# Patient Record
Sex: Female | Born: 1970 | Race: White | Hispanic: No | Marital: Married | Smoking: Never smoker
Health system: Southern US, Community
[De-identification: ages and names within clinical notes are randomized; demographics above are authoritative.]

## PROBLEM LIST (undated history)

## (undated) DIAGNOSIS — T7840XA Allergy, unspecified, initial encounter: Secondary | ICD-10-CM

## (undated) DIAGNOSIS — B20 Human immunodeficiency virus [HIV] disease: Secondary | ICD-10-CM

## (undated) DIAGNOSIS — B019 Varicella without complication: Secondary | ICD-10-CM

## (undated) DIAGNOSIS — N390 Urinary tract infection, site not specified: Secondary | ICD-10-CM

## (undated) DIAGNOSIS — Z98811 Dental restoration status: Secondary | ICD-10-CM

## (undated) DIAGNOSIS — Z21 Asymptomatic human immunodeficiency virus [HIV] infection status: Secondary | ICD-10-CM

## (undated) DIAGNOSIS — Z923 Personal history of irradiation: Secondary | ICD-10-CM

## (undated) DIAGNOSIS — C50919 Malignant neoplasm of unspecified site of unspecified female breast: Secondary | ICD-10-CM

## (undated) HISTORY — DX: Allergy, unspecified, initial encounter: T78.40XA

## (undated) HISTORY — DX: Varicella without complication: B01.9

## (undated) HISTORY — DX: Asymptomatic human immunodeficiency virus (hiv) infection status: Z21

## (undated) HISTORY — PX: BREAST SURGERY: SHX581

## (undated) HISTORY — DX: Human immunodeficiency virus (HIV) disease: B20

## (undated) HISTORY — DX: Urinary tract infection, site not specified: N39.0

## (undated) HISTORY — PX: DILATION AND EVACUATION: SHX1459

---

## 1997-09-11 ENCOUNTER — Other Ambulatory Visit: Admission: RE | Admit: 1997-09-11 | Discharge: 1997-09-11 | Payer: Self-pay | Admitting: Obstetrics and Gynecology

## 1999-01-06 ENCOUNTER — Other Ambulatory Visit: Admission: RE | Admit: 1999-01-06 | Discharge: 1999-01-06 | Payer: Self-pay | Admitting: Obstetrics and Gynecology

## 2003-12-17 ENCOUNTER — Other Ambulatory Visit: Admission: RE | Admit: 2003-12-17 | Discharge: 2003-12-17 | Payer: Self-pay | Admitting: Family Medicine

## 2005-01-30 ENCOUNTER — Other Ambulatory Visit: Admission: RE | Admit: 2005-01-30 | Discharge: 2005-01-30 | Payer: Self-pay | Admitting: Physician Assistant

## 2006-03-02 ENCOUNTER — Other Ambulatory Visit: Admission: RE | Admit: 2006-03-02 | Discharge: 2006-03-02 | Payer: Self-pay | Admitting: Family Medicine

## 2007-04-11 ENCOUNTER — Other Ambulatory Visit: Admission: RE | Admit: 2007-04-11 | Discharge: 2007-04-11 | Payer: Self-pay | Admitting: Family Medicine

## 2008-04-08 ENCOUNTER — Encounter (INDEPENDENT_AMBULATORY_CARE_PROVIDER_SITE_OTHER): Payer: Self-pay | Admitting: Obstetrics & Gynecology

## 2008-04-08 ENCOUNTER — Ambulatory Visit (HOSPITAL_COMMUNITY): Admission: AD | Admit: 2008-04-08 | Discharge: 2008-04-08 | Payer: Self-pay | Admitting: Obstetrics & Gynecology

## 2008-08-01 ENCOUNTER — Encounter (INDEPENDENT_AMBULATORY_CARE_PROVIDER_SITE_OTHER): Payer: Self-pay | Admitting: Obstetrics & Gynecology

## 2008-08-01 ENCOUNTER — Ambulatory Visit (HOSPITAL_COMMUNITY): Admission: RE | Admit: 2008-08-01 | Discharge: 2008-08-01 | Payer: Self-pay | Admitting: Obstetrics & Gynecology

## 2009-12-13 ENCOUNTER — Other Ambulatory Visit: Admission: RE | Admit: 2009-12-13 | Discharge: 2009-12-13 | Payer: Self-pay | Admitting: Family Medicine

## 2010-08-28 LAB — RH IMMUNE GLOBULIN WORKUP (NOT WOMEN'S HOSP)
ABO/RH(D): A NEG
Antibody Screen: NEGATIVE

## 2010-08-28 LAB — CBC
HCT: 41.5 % (ref 36.0–46.0)
Hemoglobin: 14.2 g/dL (ref 12.0–15.0)
MCV: 94.2 fL (ref 78.0–100.0)
Platelets: 212 10*3/uL (ref 150–400)
RDW: 12.3 % (ref 11.5–15.5)

## 2010-08-28 LAB — ABO/RH: ABO/RH(D): A NEG

## 2010-09-30 NOTE — Op Note (Signed)
[name], [name]               ACCOUNT NO.:  [id number]   MEDICAL RECORD NO.:  [id number]          PATIENT TYPE:  AMB   LOCATION:  [facility]                           FACILITY:  [facility]   PHYSICIAN:  Ilda Mori, M.D.   DATE OF BIRTH:  June 25, 1970   DATE OF PROCEDURE:  04/08/2008  DATE OF DISCHARGE:                               OPERATIVE REPORT   PREOPERATIVE DIAGNOSIS:  Incomplete abortion.   POSTOPERATIVE DIAGNOSIS:  Incomplete abortion.   PROCEDURE:  Dilatation and evacuation.   SURGEON:  Ilda Mori, MD   ANESTHESIA:  Paracervical block with IV sedation.   ESTIMATED BLOOD LOSS:  Minimal.   FINDINGS:  Open internal cervical os, moderate amount of products of  conception.   INDICATIONS:  This is a 40 year old gravida 1 female at approximately 41  weeks' gestation, who presents to [facility] having an episode of persistent  heavy vaginal bleeding for the previous 4 hours.  The patient had  established intrauterine pregnancy at 6-7 weeks' gestation.   PROCEDURE:  The patient was taken to the operating room on an emergency  basis and IV sedation was administered.  The vagina and perineum were  prepped and draped in sterile fashion.  A speculum was placed.  The  cervix was infiltrated with 10 mL of 2% lidocaine.  A #8 suction curette  was introduced into the endometrial cavity without any dilation being  necessary and products of conception were evacuated.  The procedure was  then terminated.  The patient left the operating room in good condition.      Ilda Mori, M.D.  Electronically Signed     RK/MEDQ  D:  04/08/2008  T:  04/08/2008  Job:  [id number]

## 2010-09-30 NOTE — Op Note (Signed)
NAMEELDORA, [name]               ACCOUNT NO.:  [id number]   MEDICAL RECORD NO.:  [id number]          PATIENT TYPE:  AMB   LOCATION:  [facility]                           FACILITY:  [facility]   PHYSICIAN:  Ilda Mori, M.D.   DATE OF BIRTH:  Aug 24, 1970   DATE OF PROCEDURE:  08/01/2008  DATE OF DISCHARGE:                               OPERATIVE REPORT   PREOPERATIVE DIAGNOSIS:  Missed abortion.   POSTOPERATIVE DIAGNOSIS:  Missed abortion.   PROCEDURE:  Dilatation and evacuation.   SURGEON:  Ilda Mori, MD   ANESTHESIA:  Paracervical block with IV sedation.   FINDINGS:  Products of conception consistent with 7-8-week missed  abortion.   SPECIMEN:  Sent to Pathology.   ESTIMATED BLOOD LOSS:  Minimal.   COMPLICATIONS:  None.   INDICATIONS:  This is a 40 year old gravida 2, para 0-0-2-0 who  presented to the office on July 31, 2008, for a routine examination  where an ultrasound was performed and showed a fetal demise.  The  patient was 8 weeks' gestation by dates.  The ultrasound showed a 6-week  fetal pole with no heartbeat seen.  The findings were discussed with the  patient and decision was made to proceed with dilatation and evacuation.  Prior the procedure, the patient had another ultrasound at her request  at [facility] which confirmed the diagnosis of missed abortion.   PROCEDURE:  The patient was taken to the operating room and placed in  the supine position and then placed in the dorsal lithotomy position.  IV sedation was administered.  The perineum and vagina were prepped and  draped in a sterile fashion.  The edges of the cervix was grasped with a  single-tooth tenaculum and 10 mL of 2% lidocaine was infiltrated in the  paracervical tissues.  The internal os was dilated with Pratt dilators  to a 21-French.  A #7 suction curette was then introduced into the  endometrial cavity and the products of conception were evacuated.  The  procedure at this point was  terminated.  The patient left the operating  room in good condition.      Ilda Mori, M.D.  Electronically Signed     RK/MEDQ  D:  08/01/2008  T:  08/02/2008  Job:  [id number]

## 2011-02-18 LAB — CBC
Hemoglobin: 12
MCHC: 35.6
Platelets: 223
RDW: 12.6

## 2011-02-18 LAB — RHOGAM INJECTION

## 2011-03-27 ENCOUNTER — Other Ambulatory Visit: Payer: Self-pay | Admitting: Surgery

## 2011-12-30 ENCOUNTER — Other Ambulatory Visit (HOSPITAL_COMMUNITY): Payer: Self-pay | Admitting: Family Medicine

## 2011-12-30 DIAGNOSIS — Z1231 Encounter for screening mammogram for malignant neoplasm of breast: Secondary | ICD-10-CM

## 2012-01-15 ENCOUNTER — Ambulatory Visit (HOSPITAL_COMMUNITY)
Admission: RE | Admit: 2012-01-15 | Discharge: 2012-01-15 | Disposition: A | Discharge status: Home / Self Care | Payer: BC Managed Care – PPO | Source: Ambulatory Visit | Attending: Family Medicine | Admitting: Family Medicine

## 2012-01-15 DIAGNOSIS — Z1231 Encounter for screening mammogram for malignant neoplasm of breast: Secondary | ICD-10-CM | POA: Insufficient documentation

## 2012-01-15 IMAGING — MG MM DIGITAL SCREENING BILAT
5 series · 5 of 5 positions shown · non-contrast
Comparison: None.

CLINICAL DATA: Screening.

DIGITAL BILATERAL SCREENING MAMMOGRAM WITH CAD

[R CC (1 of 2)]
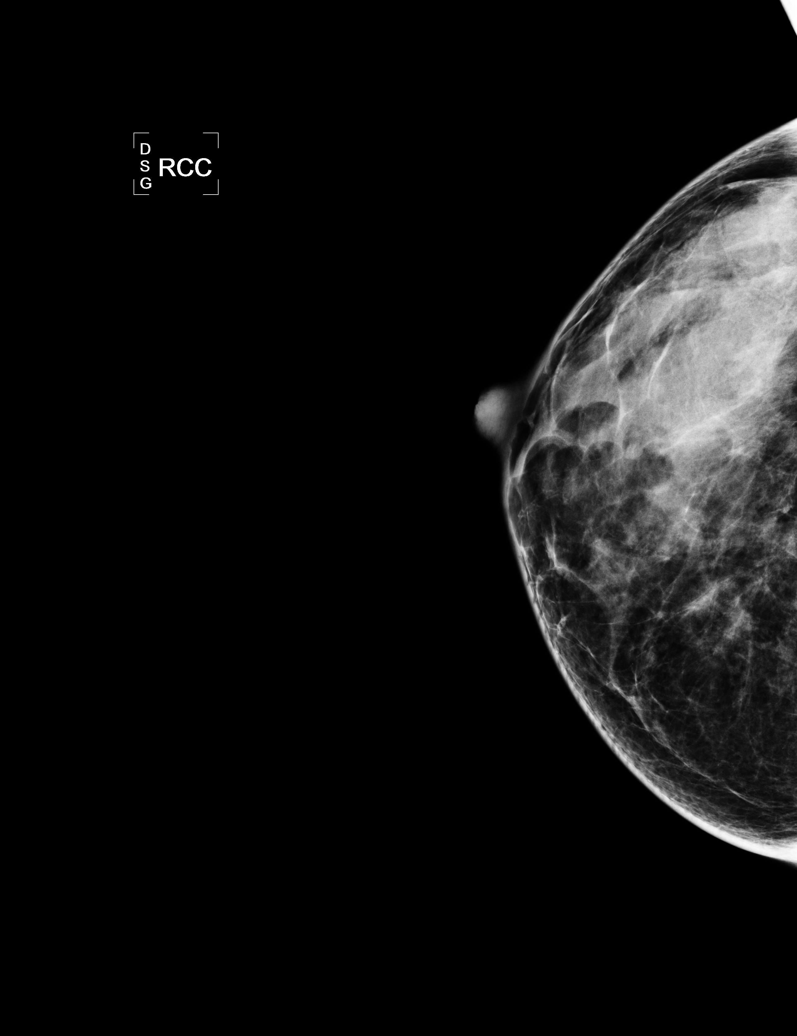

[R MLO]
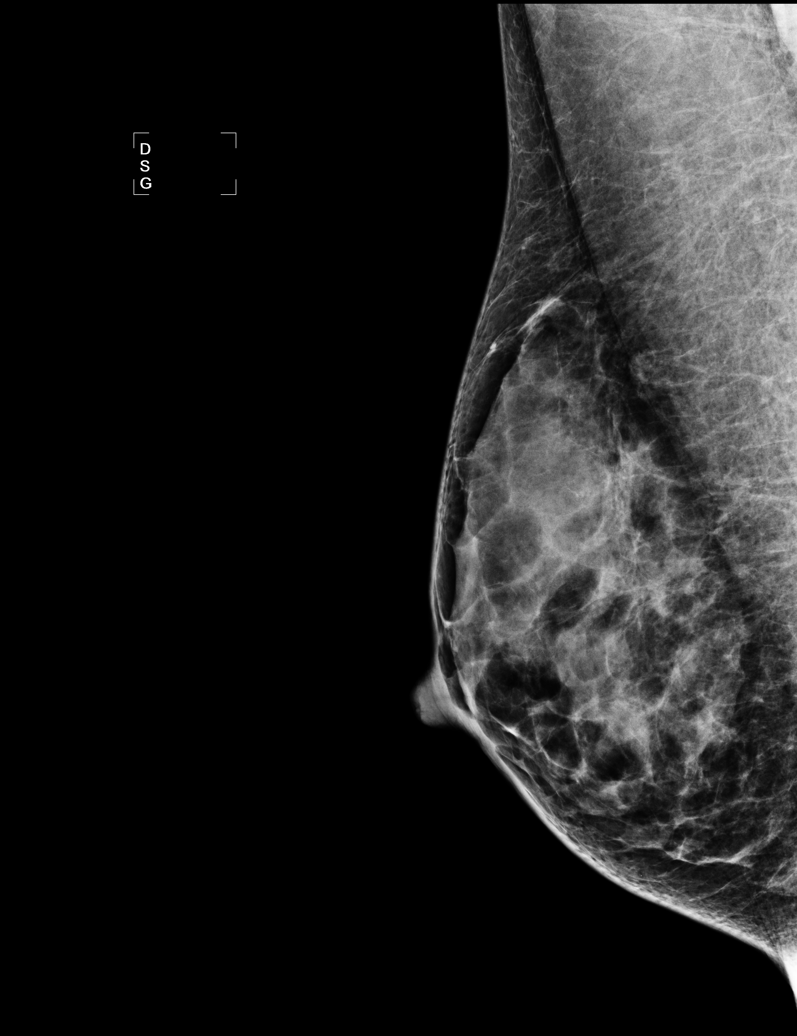

[L CC]
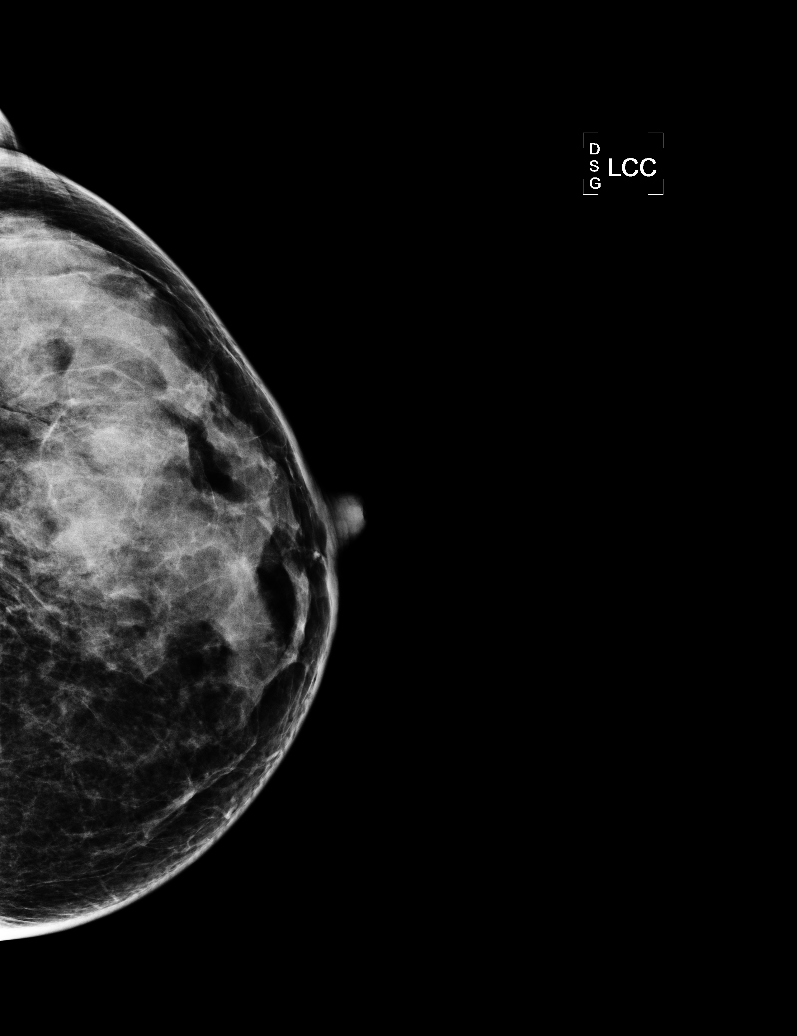

[L MLO]
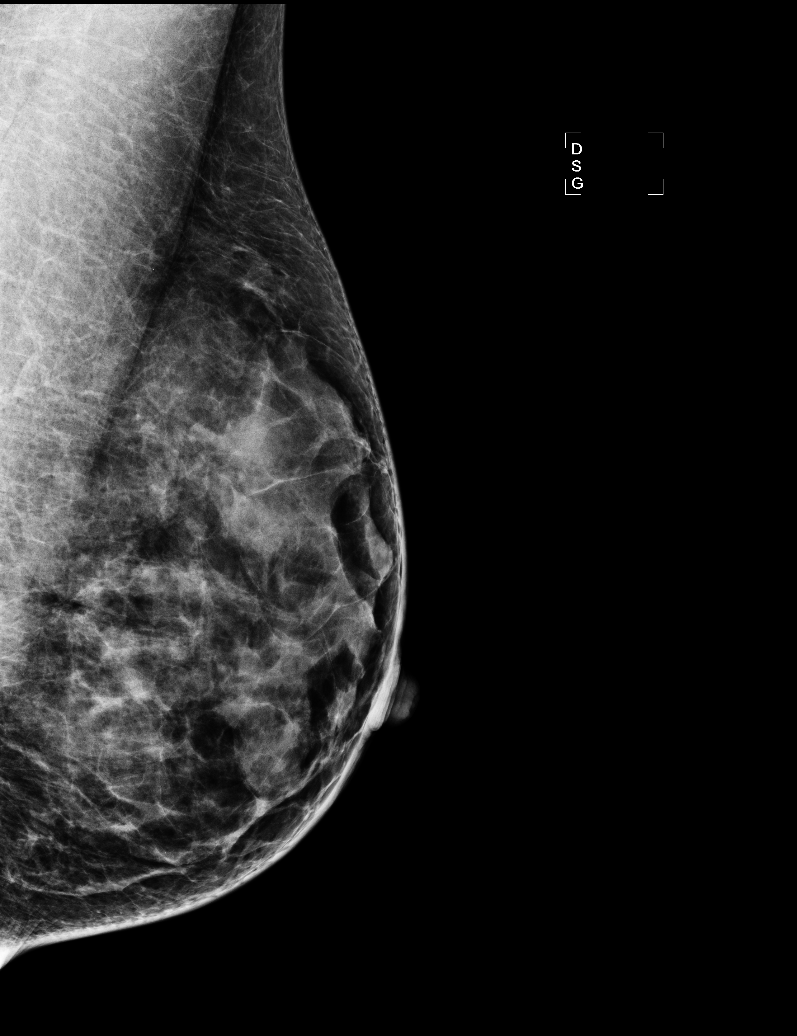

[R CC (2 of 2)]
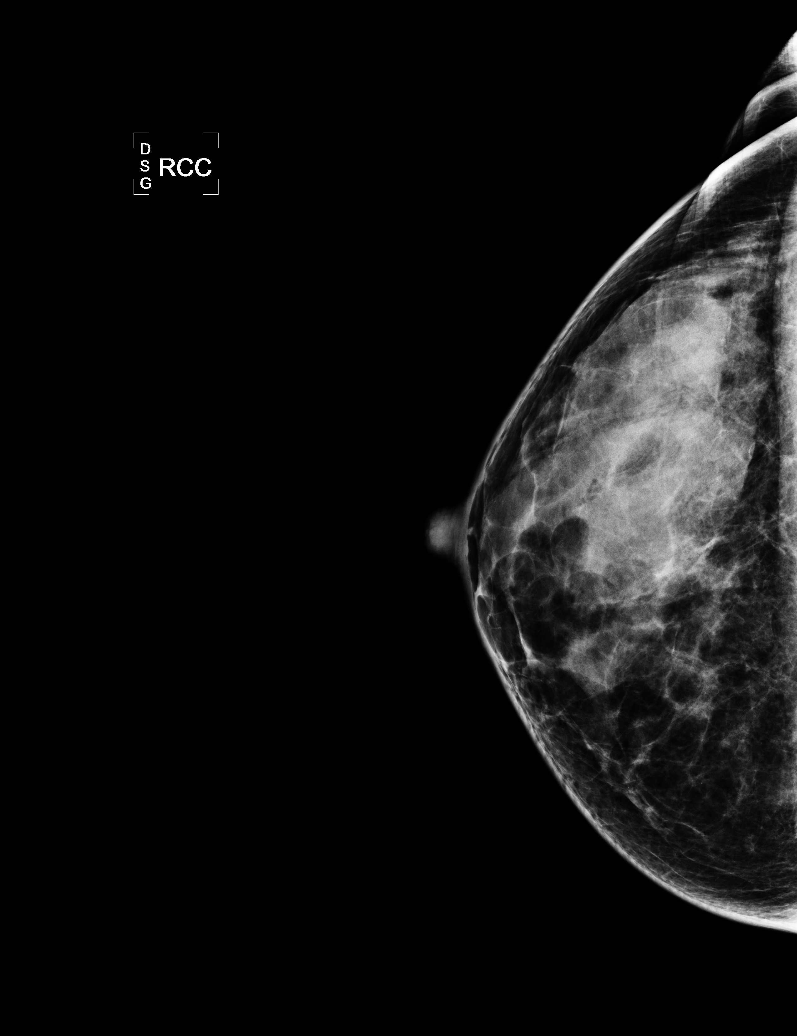

[5 of 5 positions shown; findings below may reference images not displayed]

FINDINGS: The breast tissue is heterogeneously dense. No suspicious
masses, architectural distortion, or calcifications are present.

Images were processed with CAD.
IMPRESSION: No mammographic evidence of malignancy.

A result letter of this screening mammogram will be mailed directly
to the patient.

RECOMMENATION:
Screening mammogram in one year. (Code:[H9])

BI-RADS CATEGORY 1:  Negative.

## 2012-09-09 ENCOUNTER — Other Ambulatory Visit (HOSPITAL_COMMUNITY)
Admission: RE | Admit: 2012-09-09 | Discharge: 2012-09-09 | Disposition: A | Discharge status: Home / Self Care | Payer: BC Managed Care – PPO | Source: Ambulatory Visit | Attending: Family Medicine | Admitting: Family Medicine

## 2012-09-09 ENCOUNTER — Other Ambulatory Visit: Payer: Self-pay | Admitting: Family Medicine

## 2012-09-09 DIAGNOSIS — Z1151 Encounter for screening for human papillomavirus (HPV): Secondary | ICD-10-CM | POA: Insufficient documentation

## 2012-09-09 DIAGNOSIS — Z01419 Encounter for gynecological examination (general) (routine) without abnormal findings: Secondary | ICD-10-CM | POA: Insufficient documentation

## 2012-09-30 ENCOUNTER — Other Ambulatory Visit: Payer: Self-pay | Admitting: Family Medicine

## 2012-09-30 DIAGNOSIS — R899 Unspecified abnormal finding in specimens from other organs, systems and tissues: Secondary | ICD-10-CM

## 2012-10-05 ENCOUNTER — Ambulatory Visit
Admission: RE | Admit: 2012-10-05 | Discharge: 2012-10-05 | Disposition: A | Discharge status: Home / Self Care | Payer: BC Managed Care – PPO | Source: Ambulatory Visit | Attending: Family Medicine | Admitting: Family Medicine

## 2012-10-05 DIAGNOSIS — R899 Unspecified abnormal finding in specimens from other organs, systems and tissues: Secondary | ICD-10-CM

## 2012-10-05 IMAGING — US US RENAL
1 series · 14 of 25 positions shown · non-contrast
Comparison: None.

CLINICAL DATA: Elevated renal laboratory values

RENAL/URINARY TRACT ULTRASOUND COMPLETE

[Series 1: us renal · 0.24mm/px · 14 of 34 slices shown]
[im 1/34]
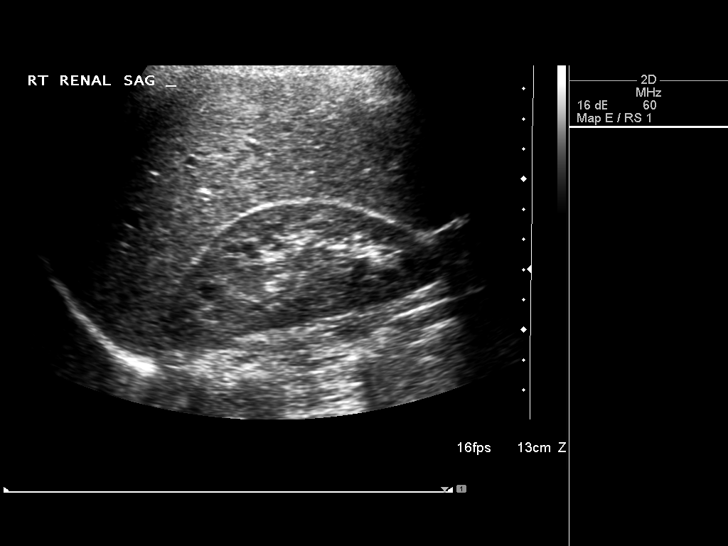
[im 3/34]
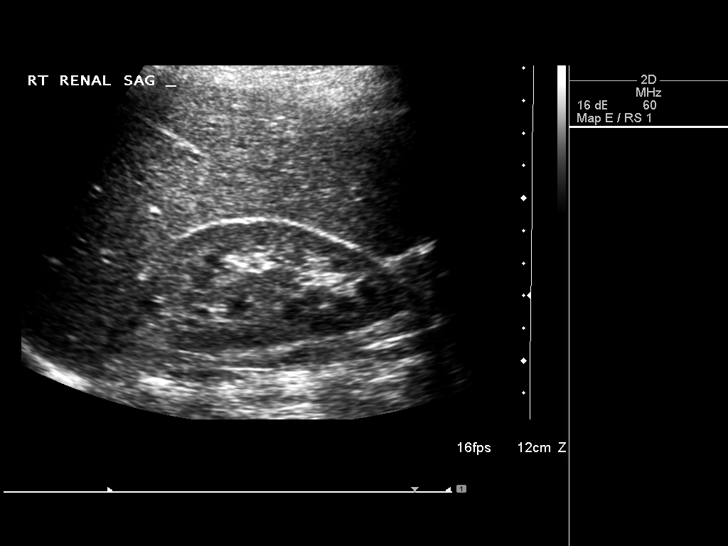
[im 6/34]
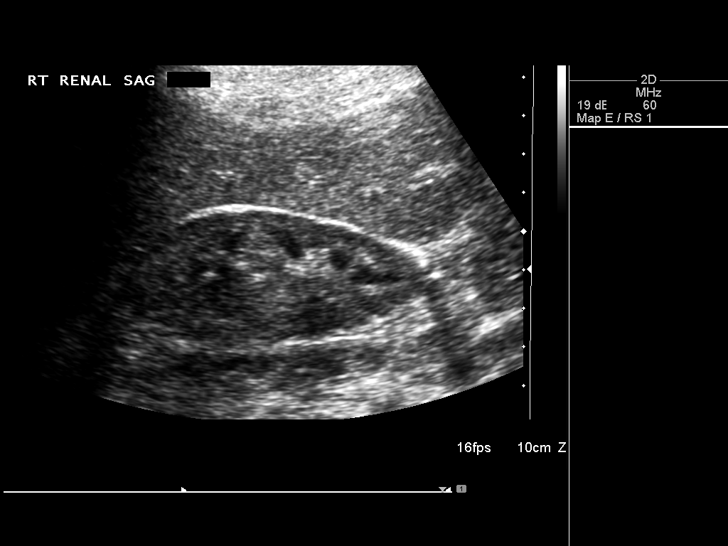
[im 9/34]
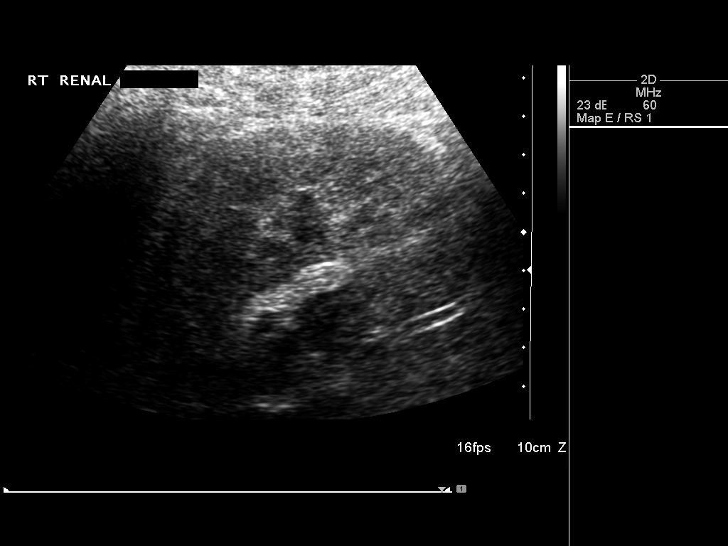
[im 12/34]
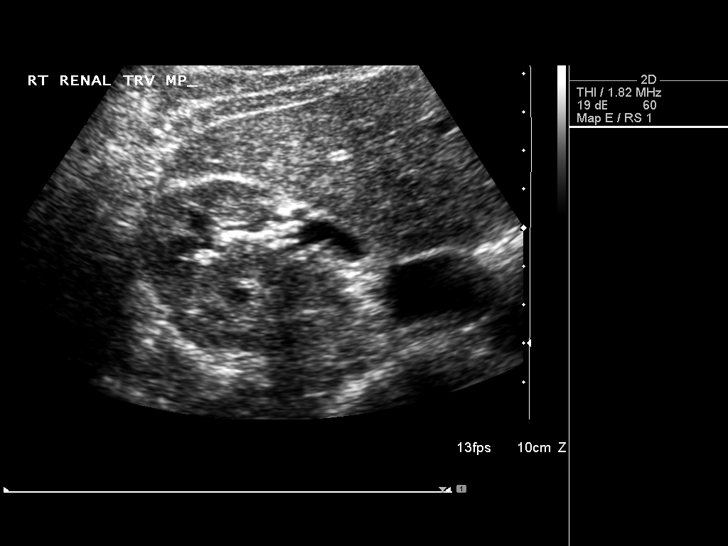
[im 13/34]
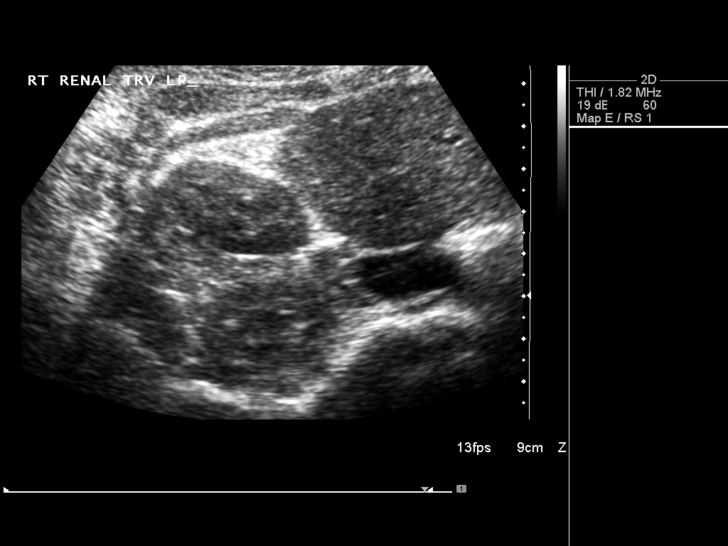
[im 16/34]
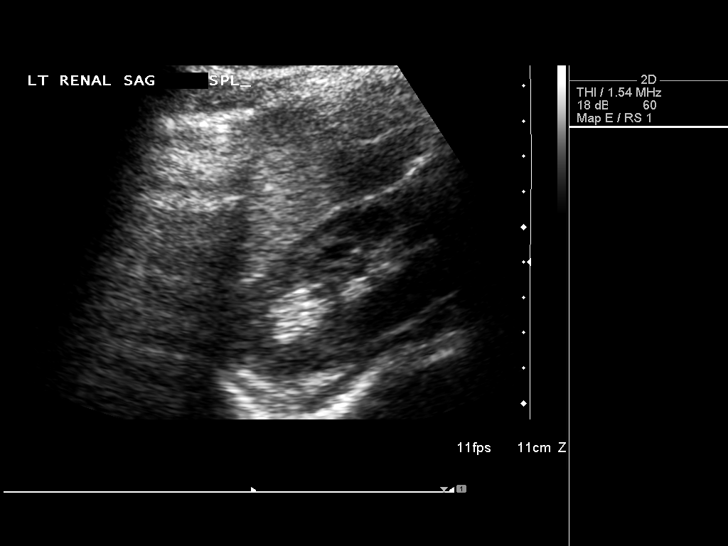
[im 18/34]
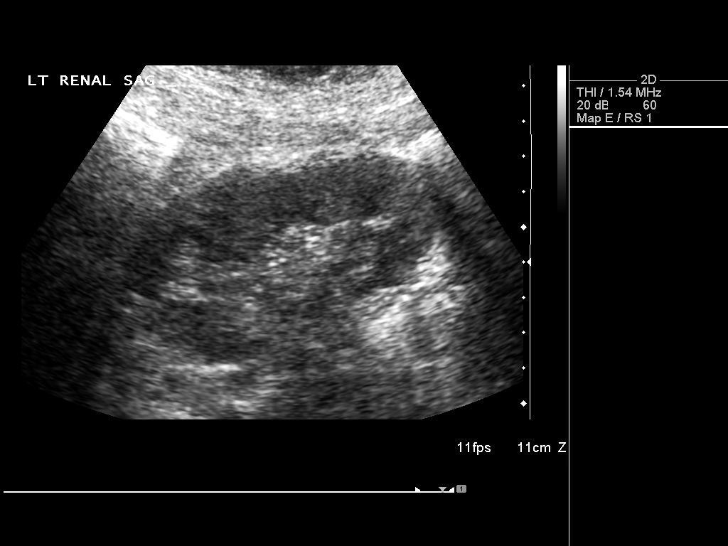
[im 21/34]
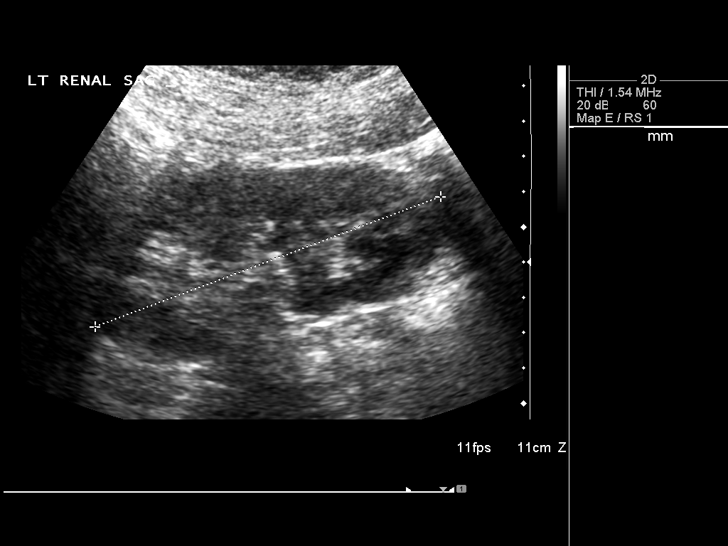
[im 23/34]
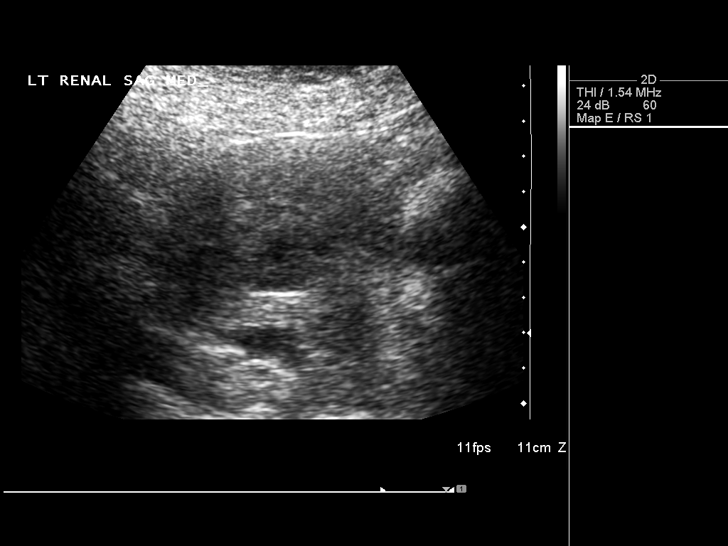
[im 25/34]
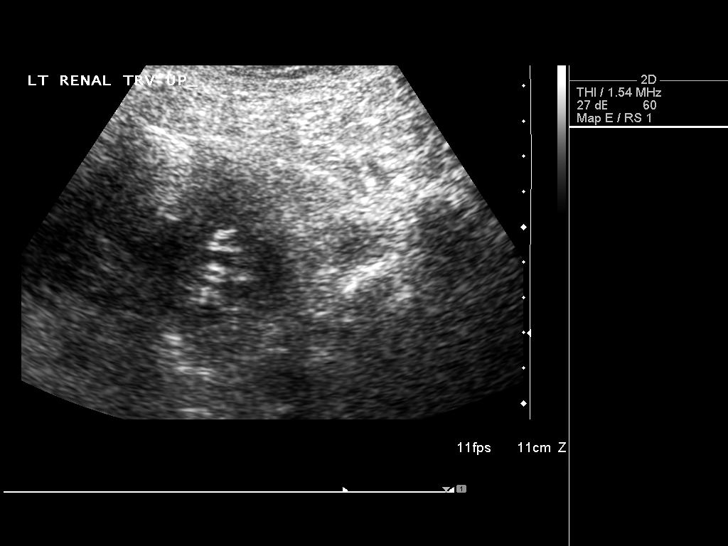
[im 28/34]
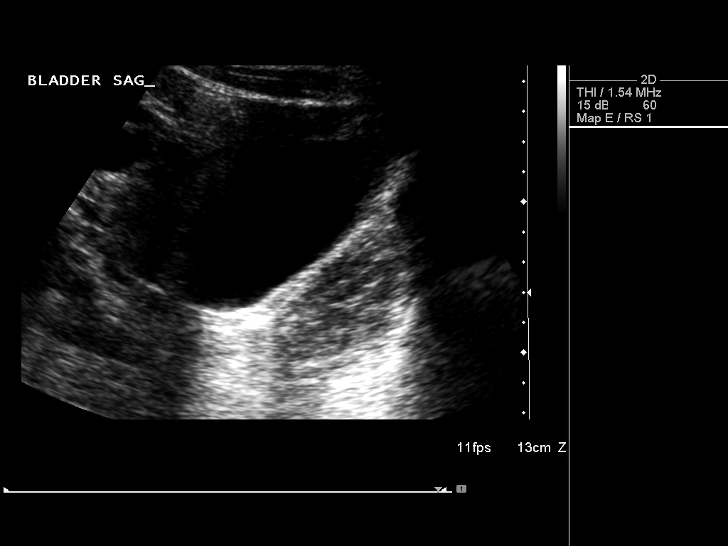
[im 31/34]
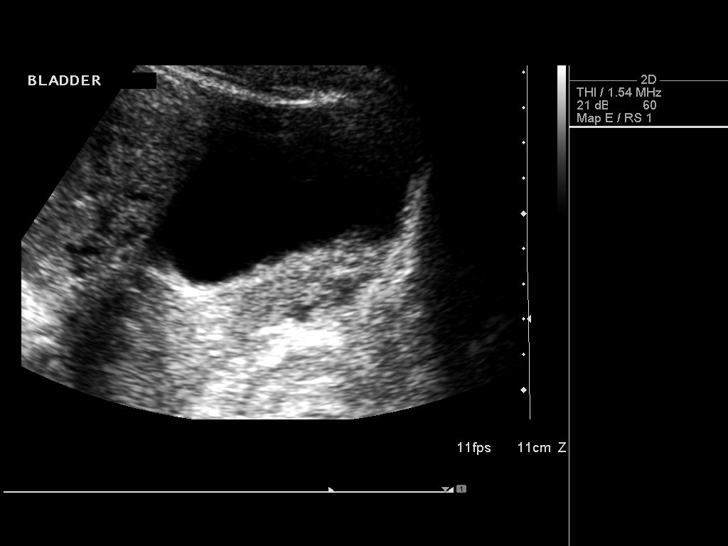
[im 34/34]
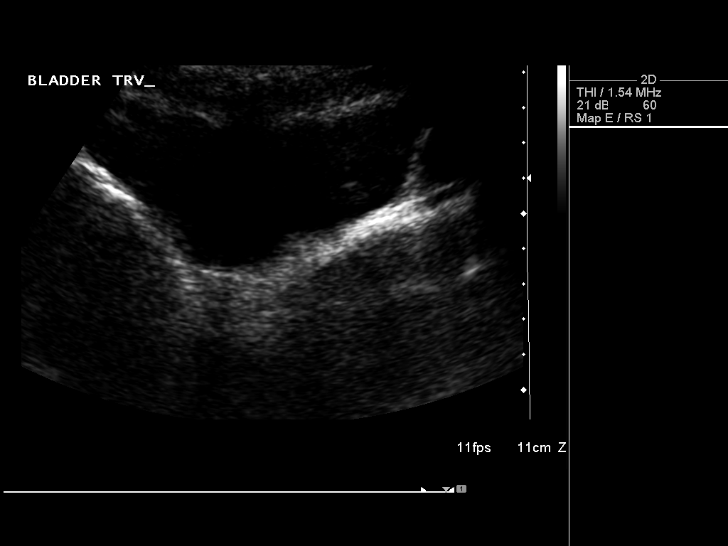

[14 of 25 positions shown; findings below may reference images not displayed]

FINDINGS: Right Kidney:  No hydronephrosis is seen.  The right kidney
measures 10.9 cm sagittally.

Left Kidney:  No hydronephrosis is noted.  The left kidney measures
10.5 cm.

Bladder:  The urinary bladder is unremarkable.
IMPRESSION: No hydronephrosis.  No abnormality of the urinary bladder.

## 2013-04-24 ENCOUNTER — Other Ambulatory Visit (HOSPITAL_COMMUNITY): Payer: Self-pay | Admitting: Family Medicine

## 2013-04-24 DIAGNOSIS — Z1231 Encounter for screening mammogram for malignant neoplasm of breast: Secondary | ICD-10-CM

## 2013-05-16 ENCOUNTER — Ambulatory Visit (HOSPITAL_COMMUNITY)
Admission: RE | Admit: 2013-05-16 | Discharge: 2013-05-16 | Disposition: A | Discharge status: Home / Self Care | Payer: BC Managed Care – PPO | Source: Ambulatory Visit | Attending: Family Medicine | Admitting: Family Medicine

## 2013-05-16 DIAGNOSIS — Z1231 Encounter for screening mammogram for malignant neoplasm of breast: Secondary | ICD-10-CM

## 2013-05-16 IMAGING — MG MM DIGITAL BREAST 3D TOMOSYNTHESIS
8 series · 9 of 24 positions shown · non-contrast
Comparison: Previous exam(s).

CLINICAL DATA: Screening.

EXAM:
DIGITAL SCREENING BILATERAL MAMMOGRAM WITH 3D TOMO WITH CAD

[L MLO]
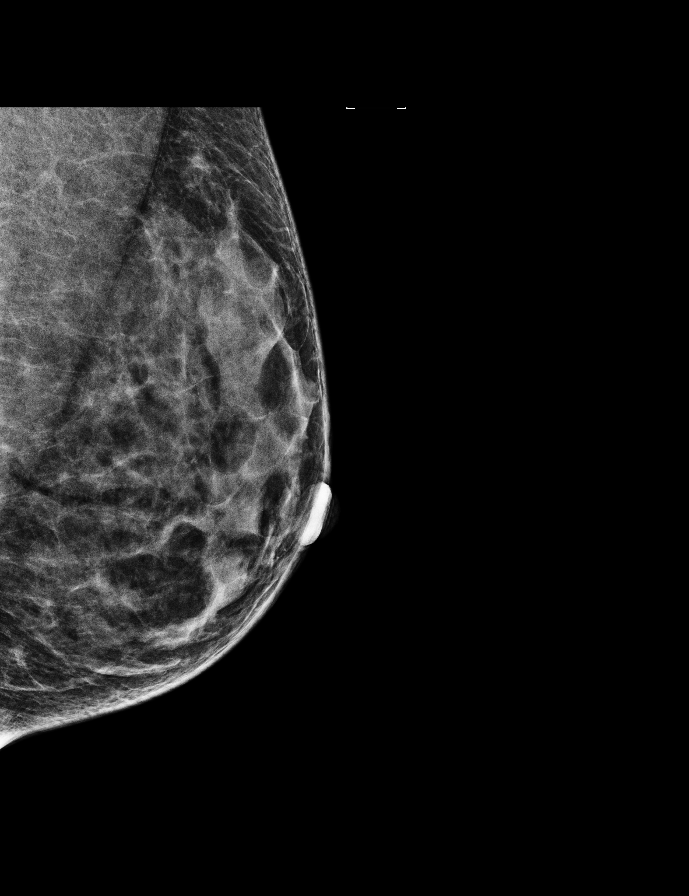

[R CC]
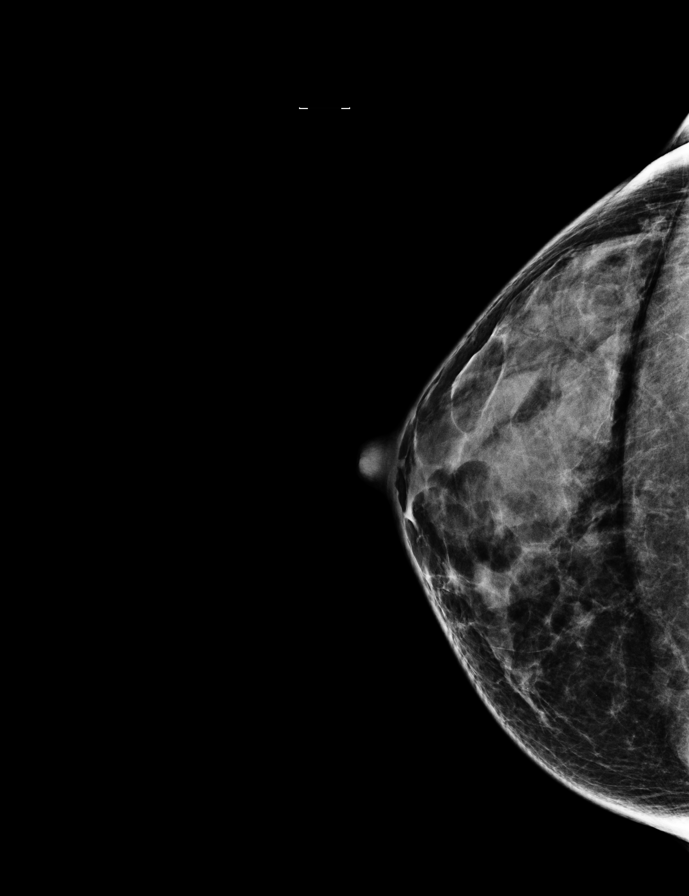

[R MLO]
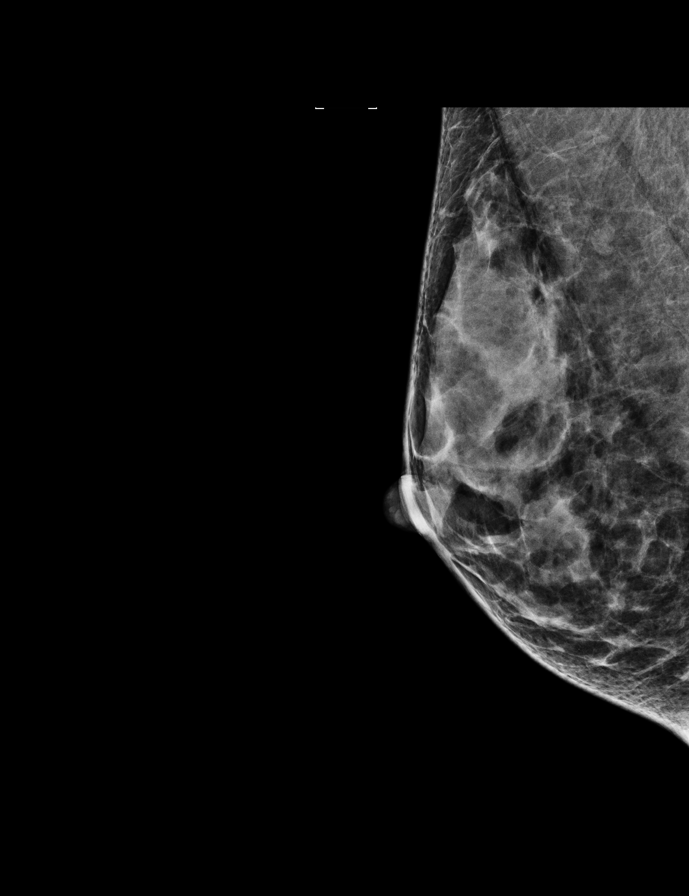

[L CC]
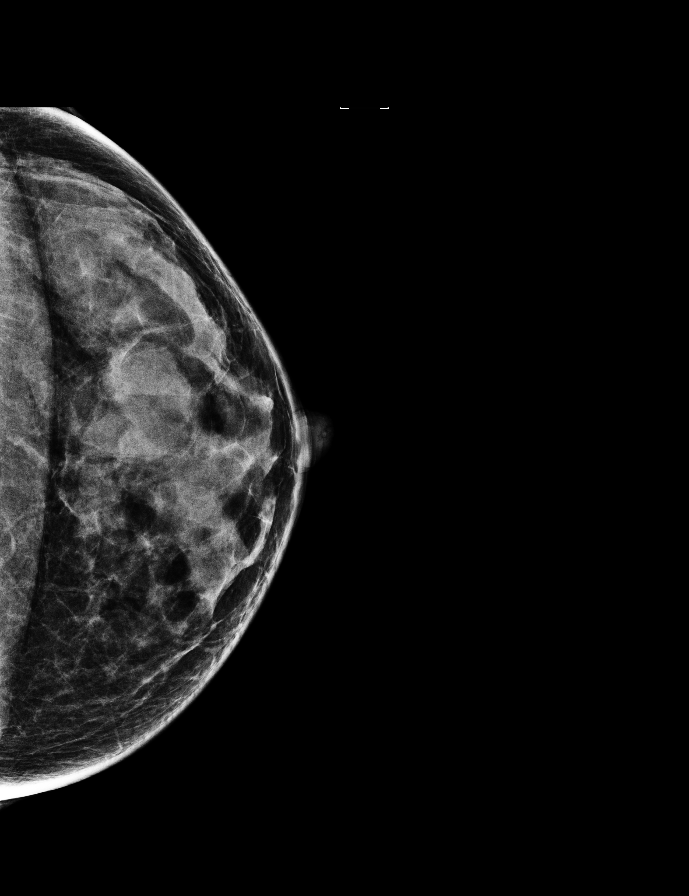

[R CC tomo · 2 of 52 frames shown]
[frame 17/52]
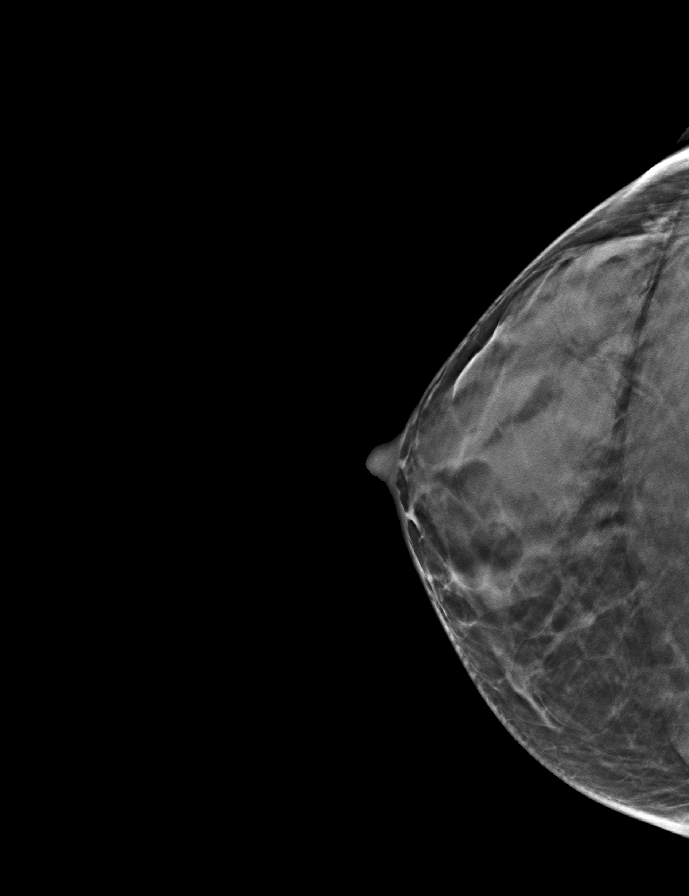
[frame 27/52]
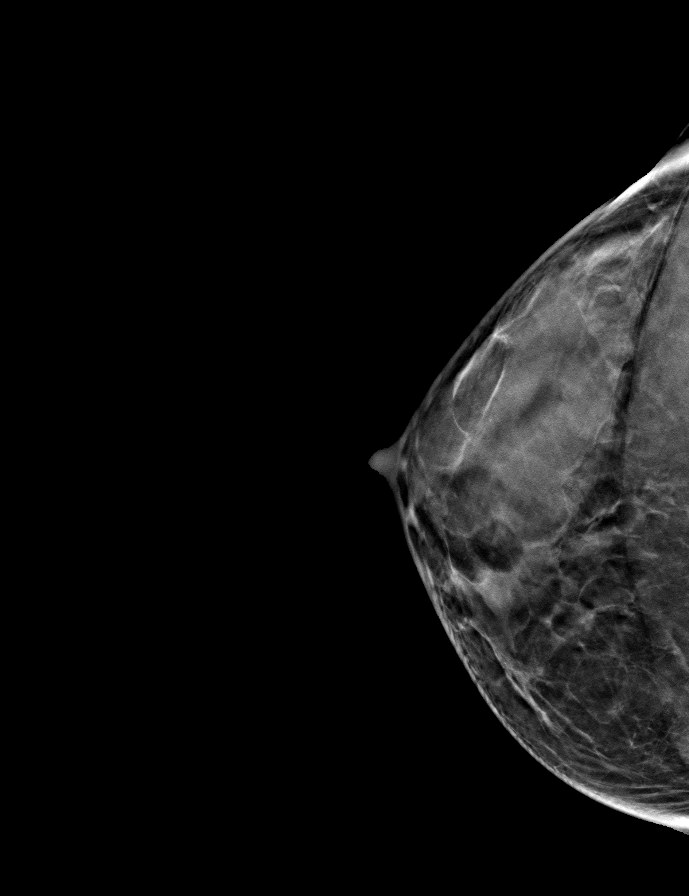

[R MLO tomo · tomo slice 23/46.0]
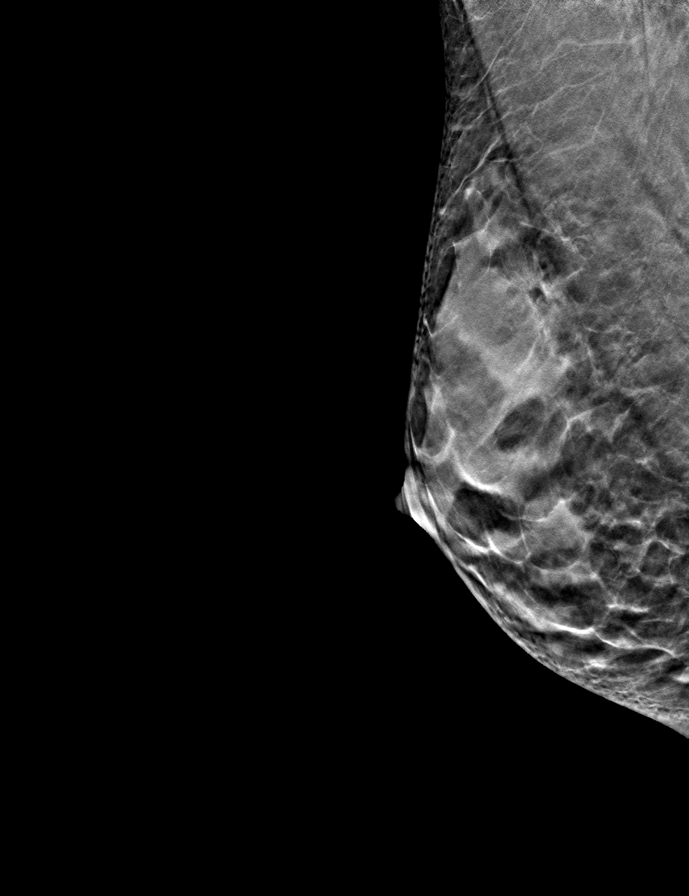

[L MLO tomo · tomo slice 25/48.0]
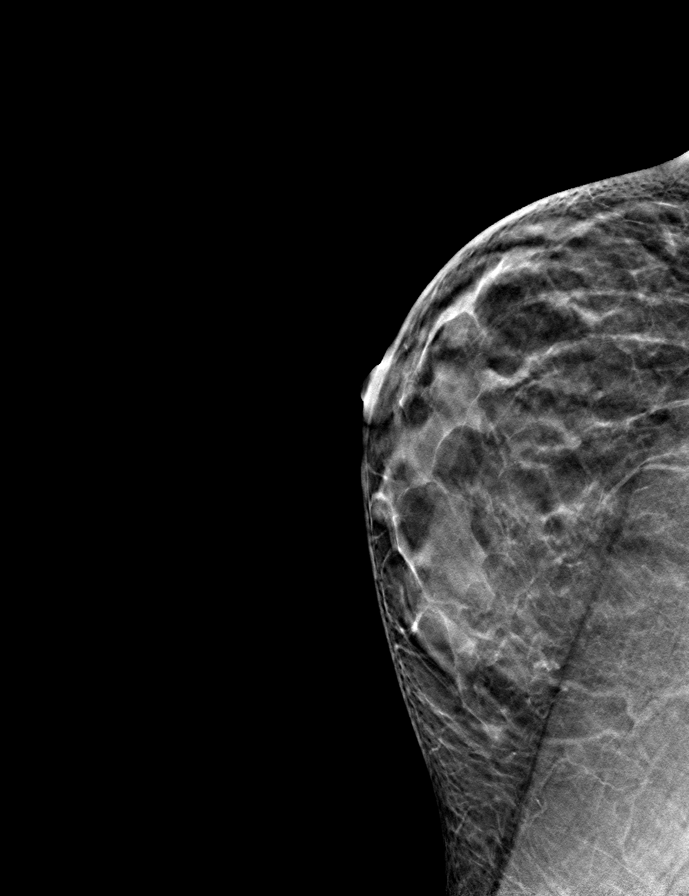

[L CC tomo · tomo slice 27/54.0]
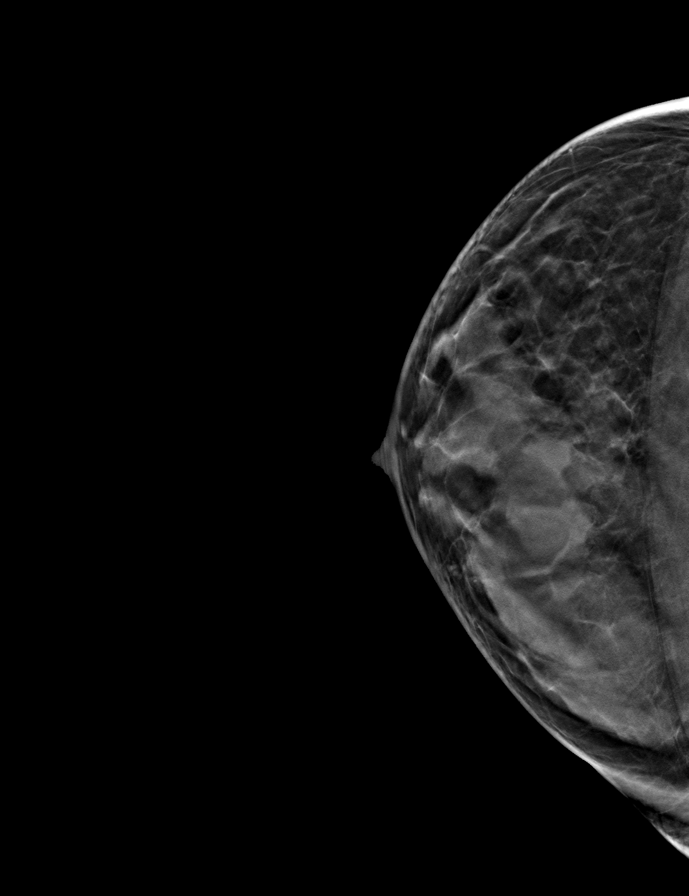

[9 of 24 positions shown; findings below may reference images not displayed]

ACR Breast Density Category d: The breasts are extremely dense,
which lowers the sensitivity of mammography.
FINDINGS: There are no findings suspicious for malignancy. Images were
processed with CAD.
IMPRESSION: No mammographic evidence of malignancy. A result letter of this
screening mammogram will be mailed directly to the patient.

RECOMMENDATION:
Screening mammogram in one year. (Code:[K2])

BI-RADS CATEGORY  1: Negative

## 2014-05-08 ENCOUNTER — Other Ambulatory Visit (HOSPITAL_COMMUNITY): Payer: Self-pay | Admitting: Family Medicine

## 2014-05-08 DIAGNOSIS — Z1231 Encounter for screening mammogram for malignant neoplasm of breast: Secondary | ICD-10-CM

## 2014-05-30 ENCOUNTER — Ambulatory Visit (HOSPITAL_COMMUNITY)
Admission: RE | Admit: 2014-05-30 | Discharge: 2014-05-30 | Disposition: A | Discharge status: Home / Self Care | Payer: 59 | Source: Ambulatory Visit | Attending: Family Medicine | Admitting: Family Medicine

## 2014-05-30 DIAGNOSIS — Z1231 Encounter for screening mammogram for malignant neoplasm of breast: Secondary | ICD-10-CM | POA: Diagnosis not present

## 2015-07-09 ENCOUNTER — Other Ambulatory Visit: Payer: Self-pay | Admitting: Family Medicine

## 2015-07-09 ENCOUNTER — Other Ambulatory Visit (HOSPITAL_COMMUNITY)
Admission: RE | Admit: 2015-07-09 | Discharge: 2015-07-09 | Disposition: A | Discharge status: Home / Self Care | Payer: 59 | Source: Ambulatory Visit | Attending: Family Medicine | Admitting: Family Medicine

## 2015-07-09 DIAGNOSIS — Z01411 Encounter for gynecological examination (general) (routine) with abnormal findings: Secondary | ICD-10-CM | POA: Diagnosis not present

## 2015-07-12 LAB — CYTOLOGY - PAP

## 2015-07-17 ENCOUNTER — Other Ambulatory Visit: Payer: Self-pay

## 2015-07-17 DIAGNOSIS — Z1231 Encounter for screening mammogram for malignant neoplasm of breast: Secondary | ICD-10-CM

## 2015-07-30 ENCOUNTER — Ambulatory Visit
Admission: RE | Admit: 2015-07-30 | Discharge: 2015-07-30 | Disposition: A | Discharge status: Home / Self Care | Payer: 59 | Source: Ambulatory Visit

## 2015-07-30 DIAGNOSIS — Z1231 Encounter for screening mammogram for malignant neoplasm of breast: Secondary | ICD-10-CM

## 2015-07-30 IMAGING — MG MM SCREENING BREAST TOMO BILATERAL
9 of 12 series · 9 of 28 positions shown · non-contrast
Comparison: Previous exam(s).

CLINICAL DATA: Screening.

EXAM:
DIGITAL SCREENING BILATERAL MAMMOGRAM WITH 3D TOMO WITH CAD

[L MLO synth-2D]
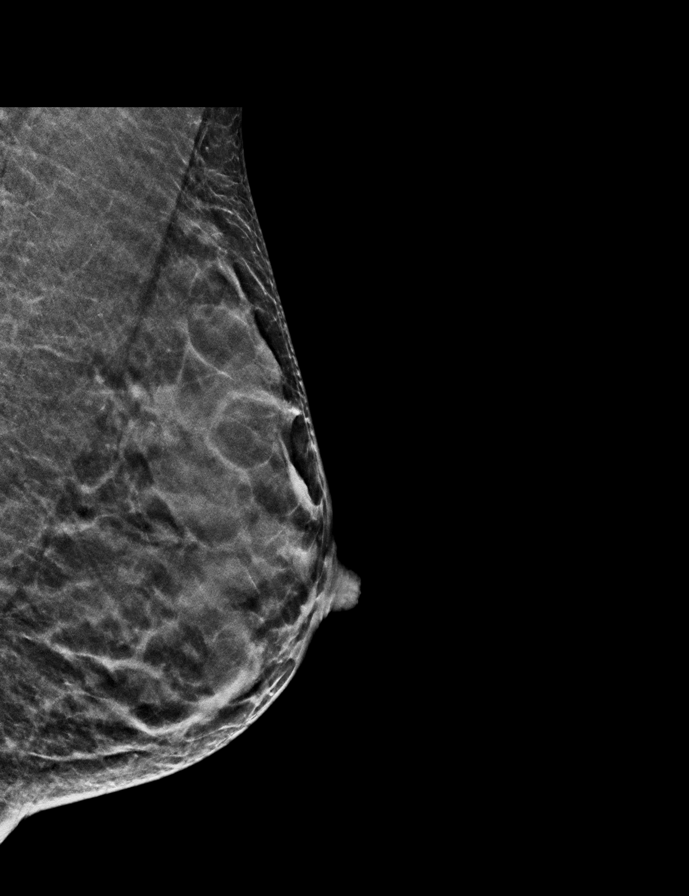

[L CC synth-2D]
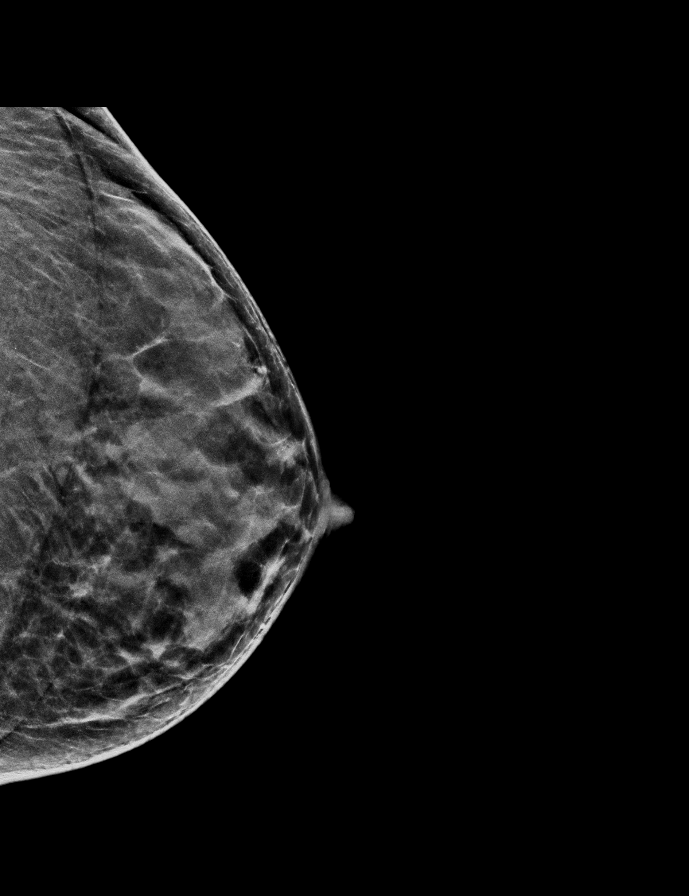

[R MLO]
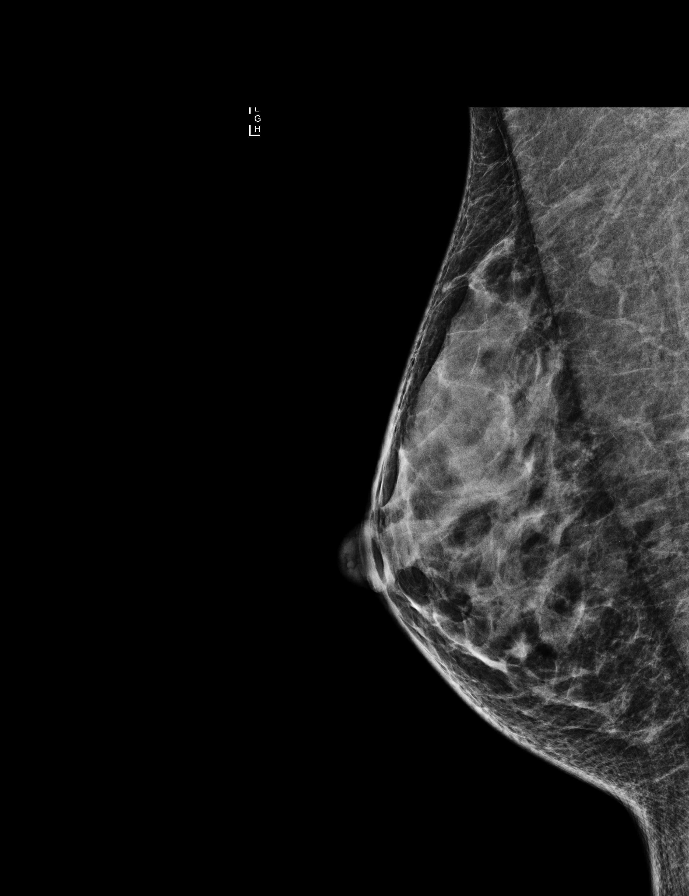

[L CC]
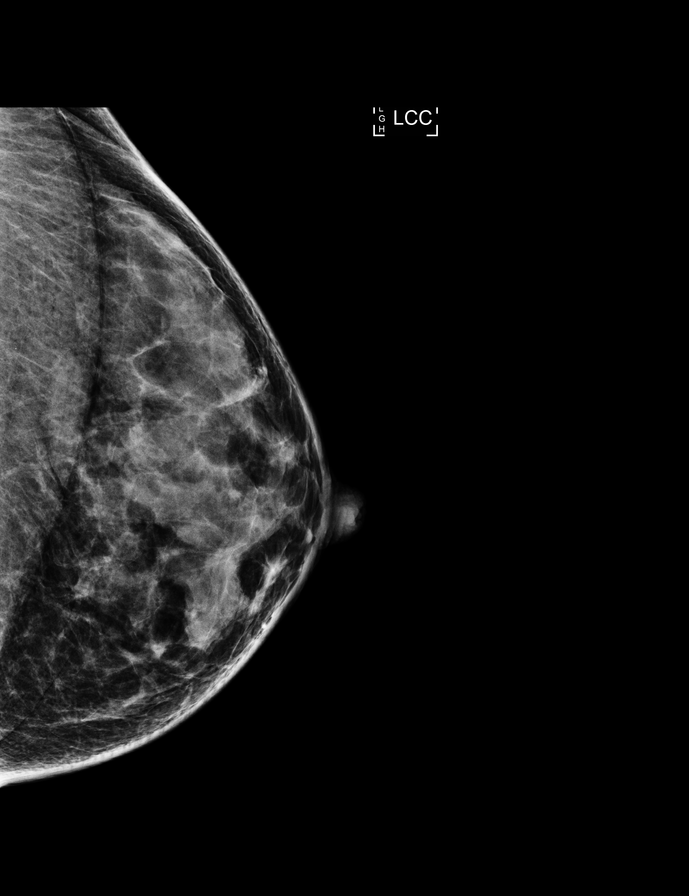

[R CC]
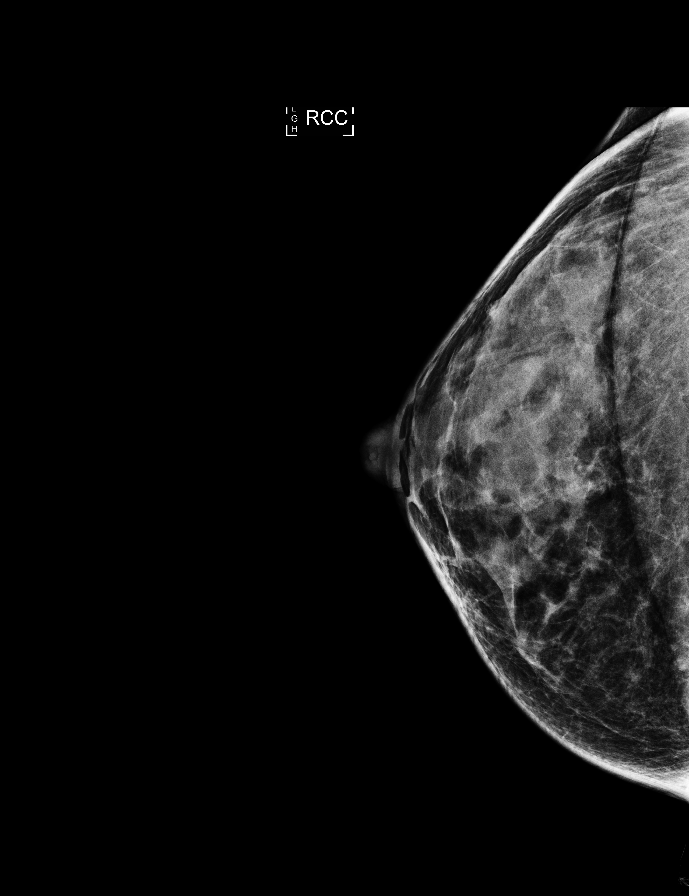

[R MLO synth-2D]
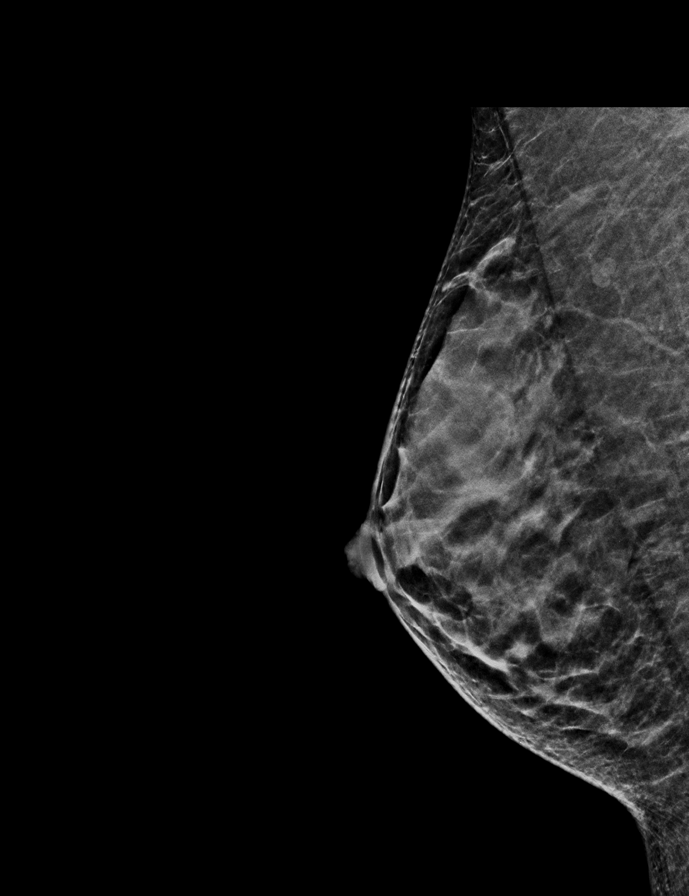

[L MLO]
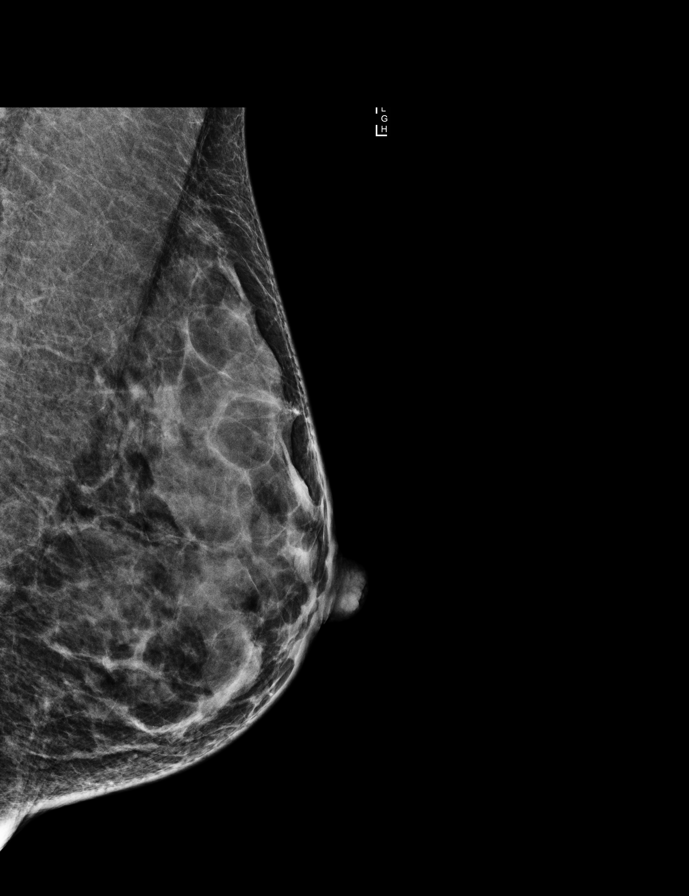

[R CC synth-2D]
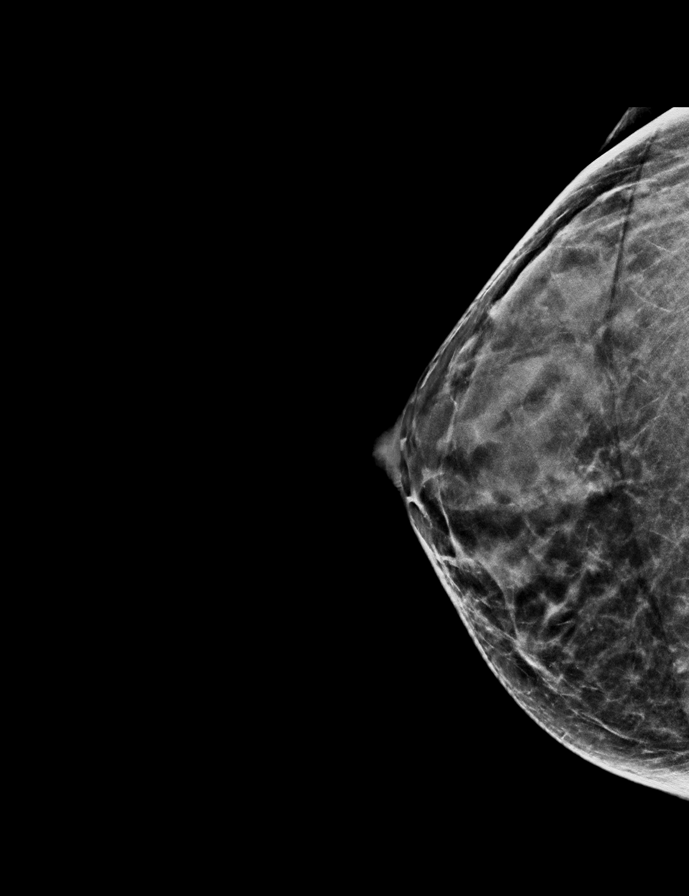

[L CC tomo · tomo slice 23/46.0]
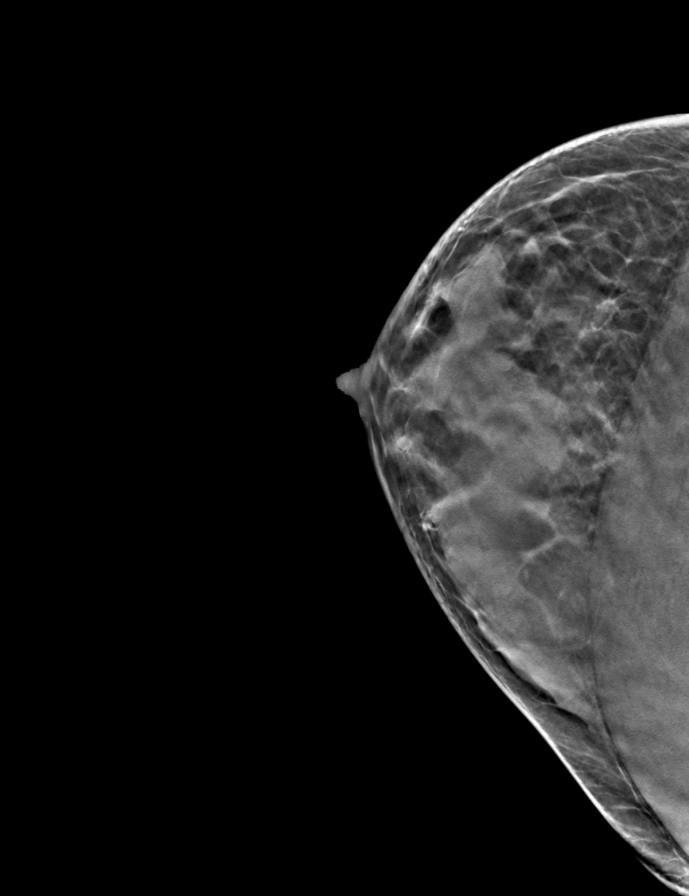

[9 of 28 positions shown; findings below may reference images not displayed]

ACR Breast Density Category d: The breast tissue is extremely dense,
which lowers the sensitivity of mammography.
FINDINGS: There are no findings suspicious for malignancy. Images were
processed with CAD.
IMPRESSION: No mammographic evidence of malignancy. A result letter of this
screening mammogram will be mailed directly to the patient.

RECOMMENDATION:
Screening mammogram in one year. (Code:[8S])

BI-RADS CATEGORY  1: Negative.

## 2015-10-31 LAB — CBC AND DIFFERENTIAL
HCT: 41 (ref 36–46)
HEMOGLOBIN: 14.1 (ref 12.0–16.0)
PLATELETS: 237 (ref 150–399)
WBC: 7.3

## 2015-10-31 LAB — TSH: TSH: 1.35 (ref <=5.90)

## 2015-10-31 LAB — VITAMIN B12: Vitamin B-12: 281

## 2016-05-18 HISTORY — PX: BREAST LUMPECTOMY: SHX2

## 2016-05-25 DIAGNOSIS — Z01419 Encounter for gynecological examination (general) (routine) without abnormal findings: Secondary | ICD-10-CM | POA: Diagnosis not present

## 2016-05-25 DIAGNOSIS — Z6822 Body mass index (BMI) 22.0-22.9, adult: Secondary | ICD-10-CM | POA: Diagnosis not present

## 2016-07-30 DIAGNOSIS — Z1231 Encounter for screening mammogram for malignant neoplasm of breast: Secondary | ICD-10-CM | POA: Diagnosis not present

## 2016-07-31 ENCOUNTER — Other Ambulatory Visit: Payer: Self-pay | Admitting: Obstetrics and Gynecology

## 2016-07-31 DIAGNOSIS — R928 Other abnormal and inconclusive findings on diagnostic imaging of breast: Secondary | ICD-10-CM

## 2016-08-05 ENCOUNTER — Other Ambulatory Visit: Payer: 59

## 2016-08-05 ENCOUNTER — Ambulatory Visit
Admission: RE | Admit: 2016-08-05 | Discharge: 2016-08-05 | Disposition: A | Discharge status: Home / Self Care | Payer: 59 | Source: Ambulatory Visit | Attending: Obstetrics and Gynecology | Admitting: Obstetrics and Gynecology

## 2016-08-05 ENCOUNTER — Other Ambulatory Visit: Payer: Self-pay | Admitting: Obstetrics and Gynecology

## 2016-08-05 DIAGNOSIS — R922 Inconclusive mammogram: Secondary | ICD-10-CM | POA: Diagnosis not present

## 2016-08-05 DIAGNOSIS — R928 Other abnormal and inconclusive findings on diagnostic imaging of breast: Secondary | ICD-10-CM

## 2016-08-05 DIAGNOSIS — N6489 Other specified disorders of breast: Secondary | ICD-10-CM | POA: Diagnosis not present

## 2016-08-05 DIAGNOSIS — N6310 Unspecified lump in the right breast, unspecified quadrant: Secondary | ICD-10-CM

## 2016-08-05 IMAGING — US ULTRASOUND RIGHT BREAST LIMITED
1 series · 10 of 10 positions shown · non-contrast
Comparison: Previous exam(s).

CLINICAL DATA: Callback from screening mammogram for possible right
breast distortion

EXAM:
2D DIGITAL DIAGNOSTIC RIGHT MAMMOGRAM WITH CAD AND ADJUNCT TOMO
ULTRASOUND RIGHT BREAST

[Series 1: ultrasound right breast limited · 0.04mm/px · 10 of 10 slices shown]
[im 1/10]
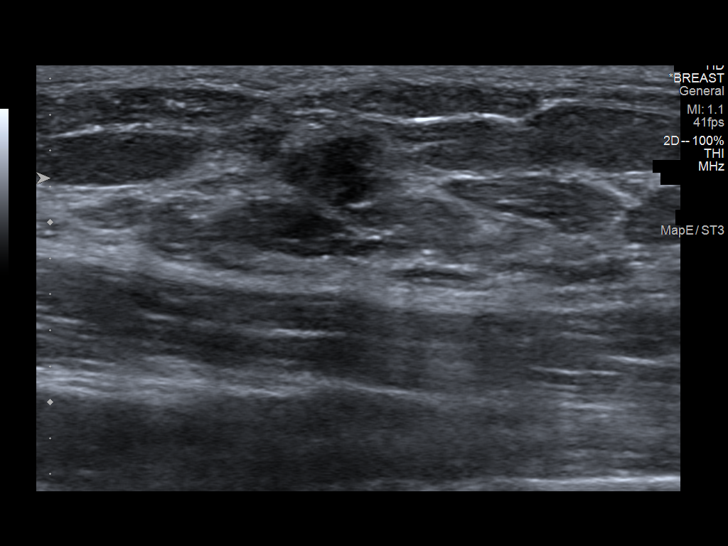
[im 2/10]
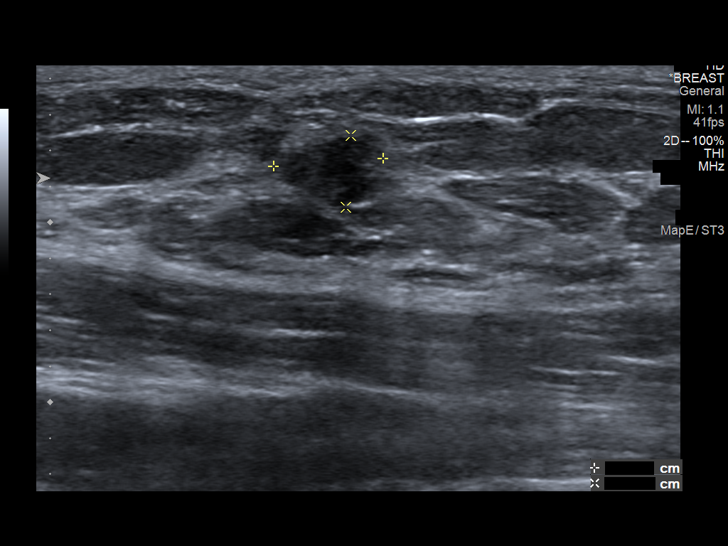
[im 3/10]
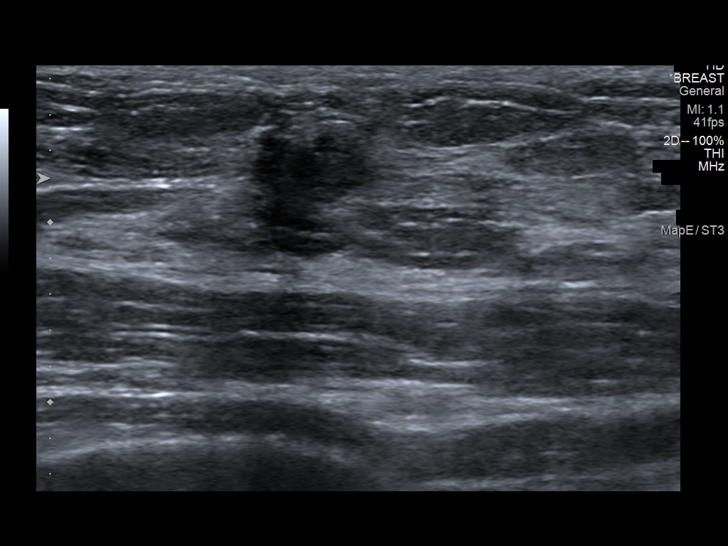
[im 4/10]
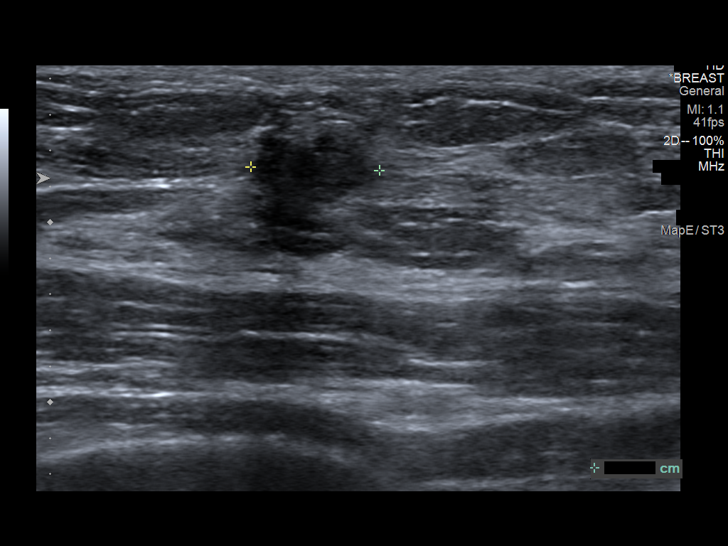
[im 5/10]
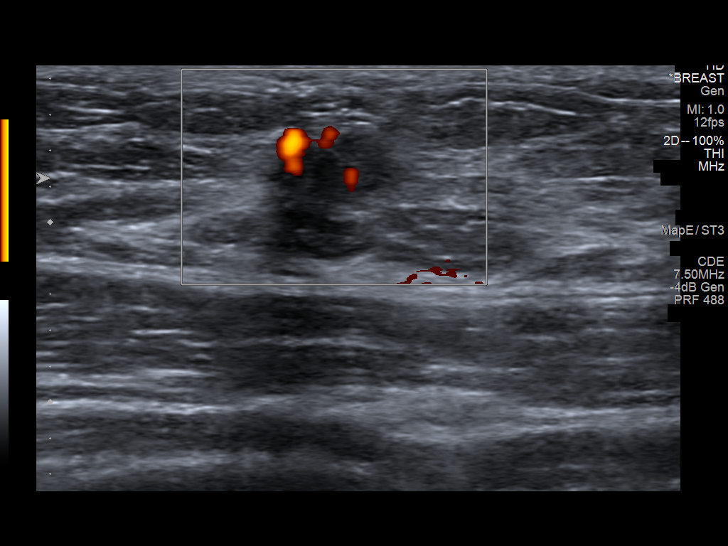
[im 6/10]
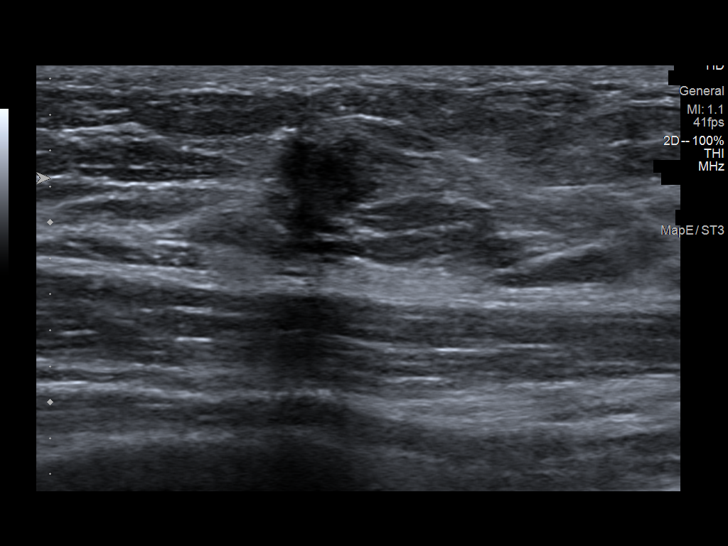
[im 7/10]
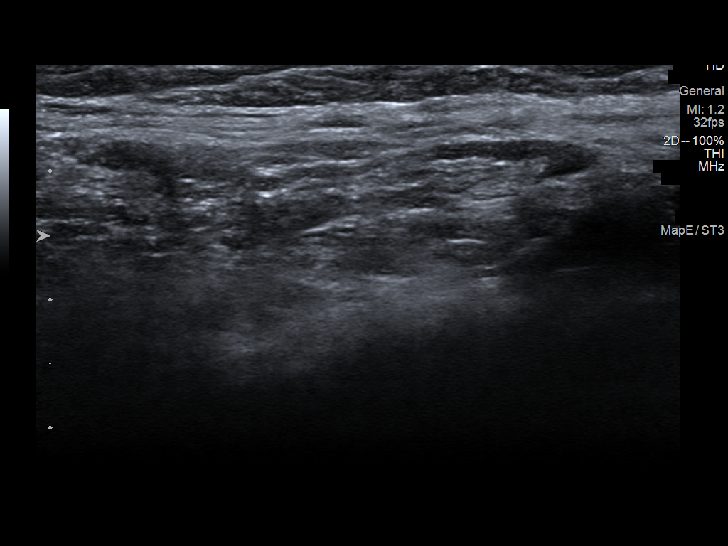
[im 8/10]
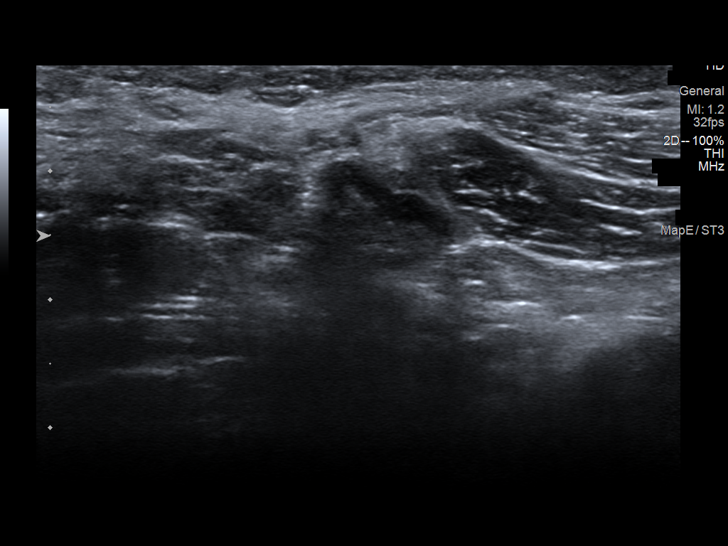
[im 9/10]
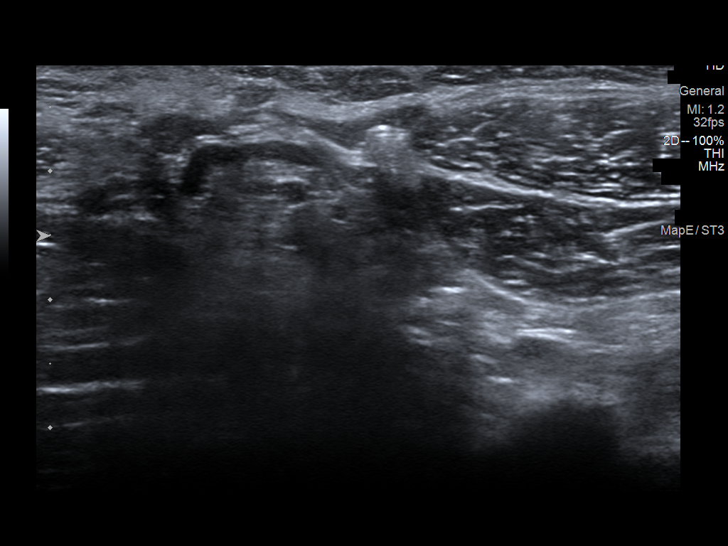
[im 10/10]
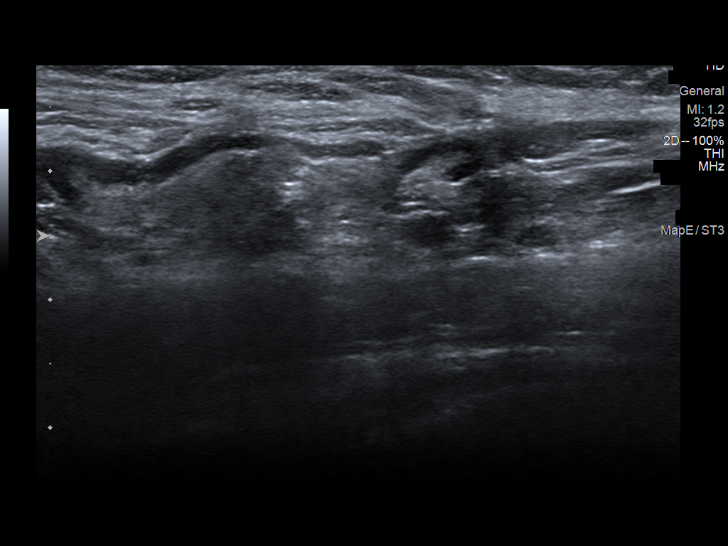

[10 of 10 positions shown; findings below may reference images not displayed]

ACR Breast Density Category c: The breast tissue is heterogeneously
dense, which may obscure small masses.
FINDINGS: MLO spot-compression and full lateral views of the right breast were
performed with tomosynthesis. On the additional views, there is a
small persistent area of distortion within the superior right
breast. This appears to be within the slightly medial right breast,
posterior depth on the CC view of the screening mammogram.

Mammographic images were processed with CAD.

Targeted ultrasound of the right breast was performed demonstrating
an irregular hypoechoic mass at [DATE], 5 cm from the nipple
measuring 6 x 4 x 7 mm which is thought to correspond to the area of
distortion seen mammographically. Targeted ultrasound of the right
axilla demonstrates no suspicious appearing axillary lymph nodes.
IMPRESSION: Indeterminate right breast mass at [DATE], 5 cm from the nipple.

RECOMMENDATION:
Ultrasound-guided right breast biopsy.

I have discussed the findings and recommendations with the patient.
Results were also provided in writing at the conclusion of the
visit. If applicable, a reminder letter will be sent to the patient
regarding the next appointment.

BI-RADS CATEGORY  4: Suspicious.

## 2016-08-05 IMAGING — MG 2D DIGITAL DIAGNOSTIC UNILATERAL RIGHT MAMMOGRAM WITH CAD AND AD
6 series · 6 of 14 positions shown · non-contrast
Comparison: Previous exam(s).

CLINICAL DATA: Callback from screening mammogram for possible right
breast distortion

EXAM:
2D DIGITAL DIAGNOSTIC RIGHT MAMMOGRAM WITH CAD AND ADJUNCT TOMO
ULTRASOUND RIGHT BREAST

[R ML synth-2D]
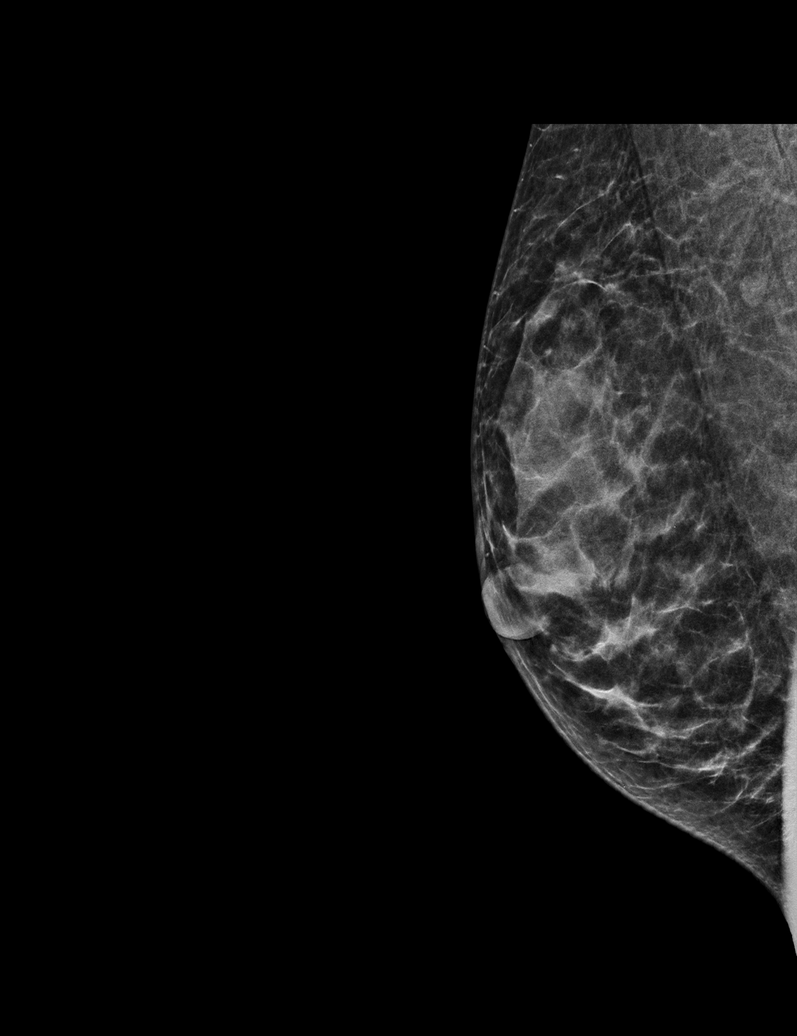

[R ML]
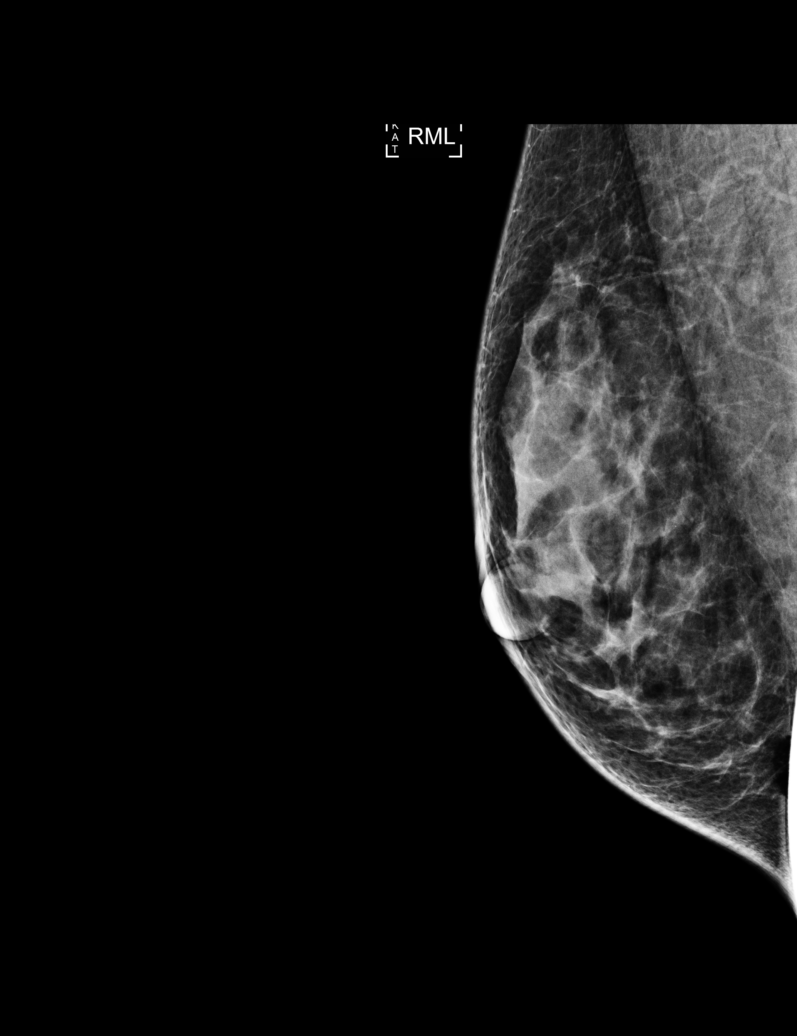

[R MLO synth-2D]
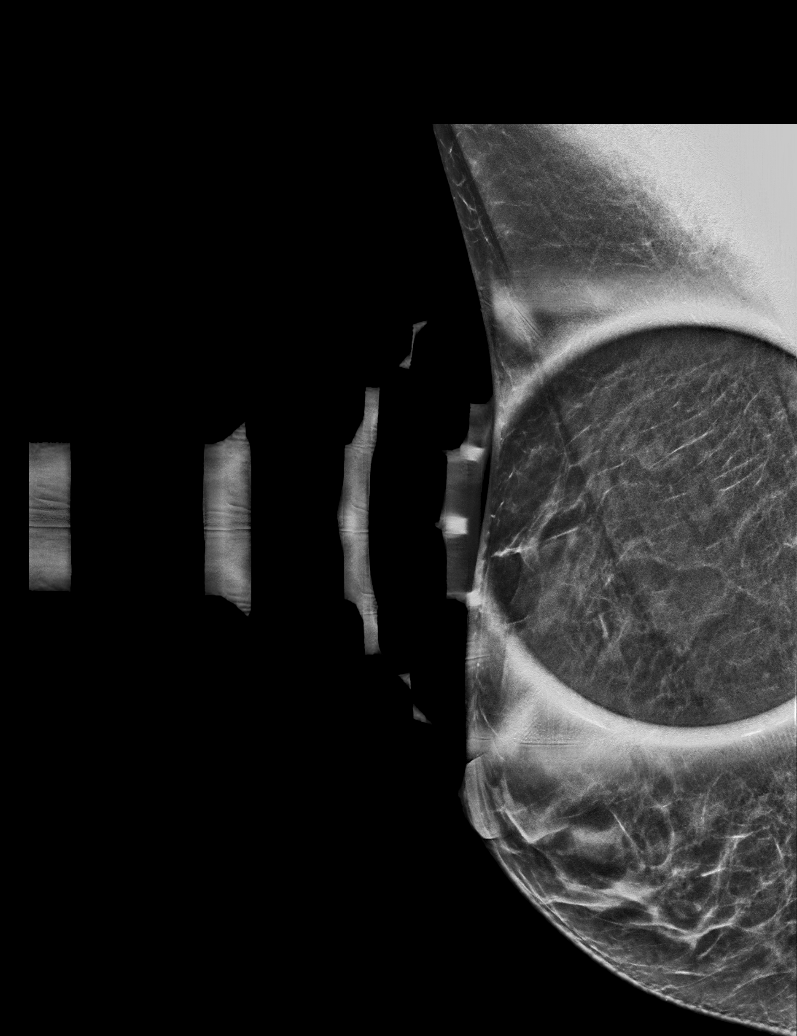

[R MLO]
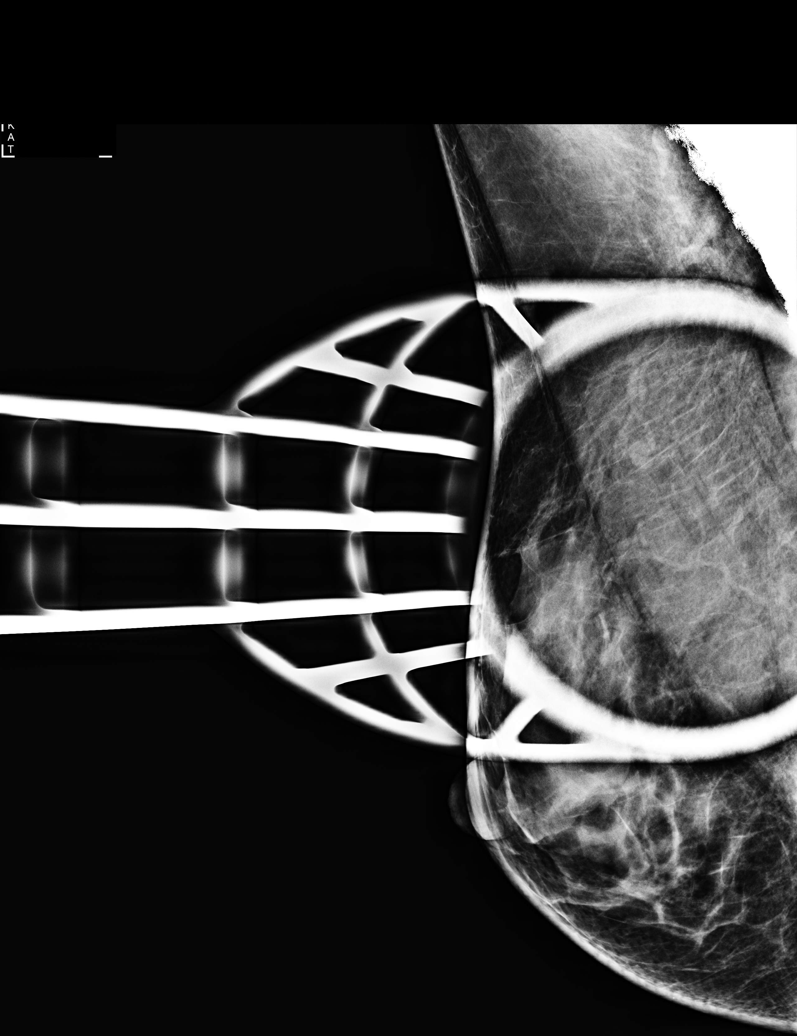

[R MLO tomo · tomo slice 25/50.0]
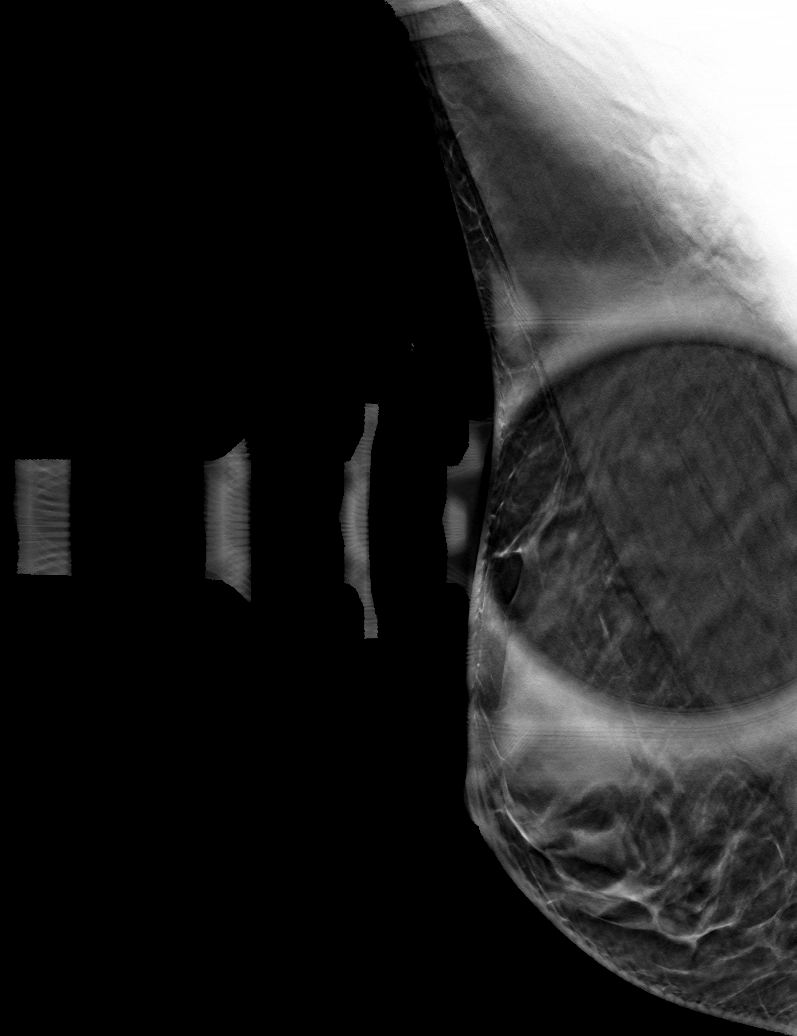

[R ML tomo · tomo slice 23/46.0]
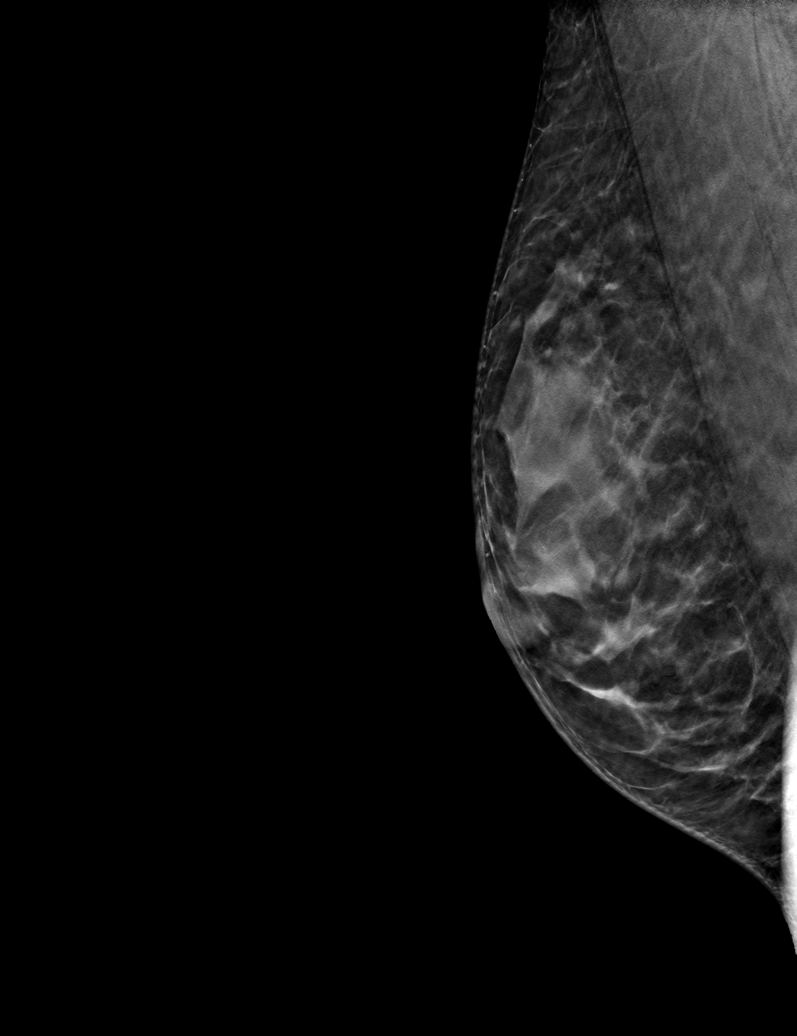

[6 of 14 positions shown; findings below may reference images not displayed]

ACR Breast Density Category c: The breast tissue is heterogeneously
dense, which may obscure small masses.
FINDINGS: MLO spot-compression and full lateral views of the right breast were
performed with tomosynthesis. On the additional views, there is a
small persistent area of distortion within the superior right
breast. This appears to be within the slightly medial right breast,
posterior depth on the CC view of the screening mammogram.

Mammographic images were processed with CAD.

Targeted ultrasound of the right breast was performed demonstrating
an irregular hypoechoic mass at [DATE], 5 cm from the nipple
measuring 6 x 4 x 7 mm which is thought to correspond to the area of
distortion seen mammographically. Targeted ultrasound of the right
axilla demonstrates no suspicious appearing axillary lymph nodes.
IMPRESSION: Indeterminate right breast mass at [DATE], 5 cm from the nipple.

RECOMMENDATION:
Ultrasound-guided right breast biopsy.

I have discussed the findings and recommendations with the patient.
Results were also provided in writing at the conclusion of the
visit. If applicable, a reminder letter will be sent to the patient
regarding the next appointment.

BI-RADS CATEGORY  4: Suspicious.

## 2016-08-06 ENCOUNTER — Other Ambulatory Visit: Payer: 59

## 2016-08-06 ENCOUNTER — Ambulatory Visit
Admission: RE | Admit: 2016-08-06 | Discharge: 2016-08-06 | Disposition: A | Discharge status: Home / Self Care | Payer: 59 | Source: Ambulatory Visit | Attending: Obstetrics and Gynecology | Admitting: Obstetrics and Gynecology

## 2016-08-06 ENCOUNTER — Other Ambulatory Visit: Payer: Self-pay | Admitting: Obstetrics and Gynecology

## 2016-08-06 DIAGNOSIS — N6310 Unspecified lump in the right breast, unspecified quadrant: Secondary | ICD-10-CM | POA: Diagnosis not present

## 2016-08-06 DIAGNOSIS — C50211 Malignant neoplasm of upper-inner quadrant of right female breast: Secondary | ICD-10-CM | POA: Diagnosis not present

## 2016-08-06 IMAGING — MG MM CLIP PLACEMENT
2 series · 2 of 2 positions shown · non-contrast
Comparison: Previous exam(s).

CLINICAL DATA: Evaluate clip placement

EXAM:
DIAGNOSTIC RIGHT MAMMOGRAM POST ULTRASOUND BIOPSY

[R ML]
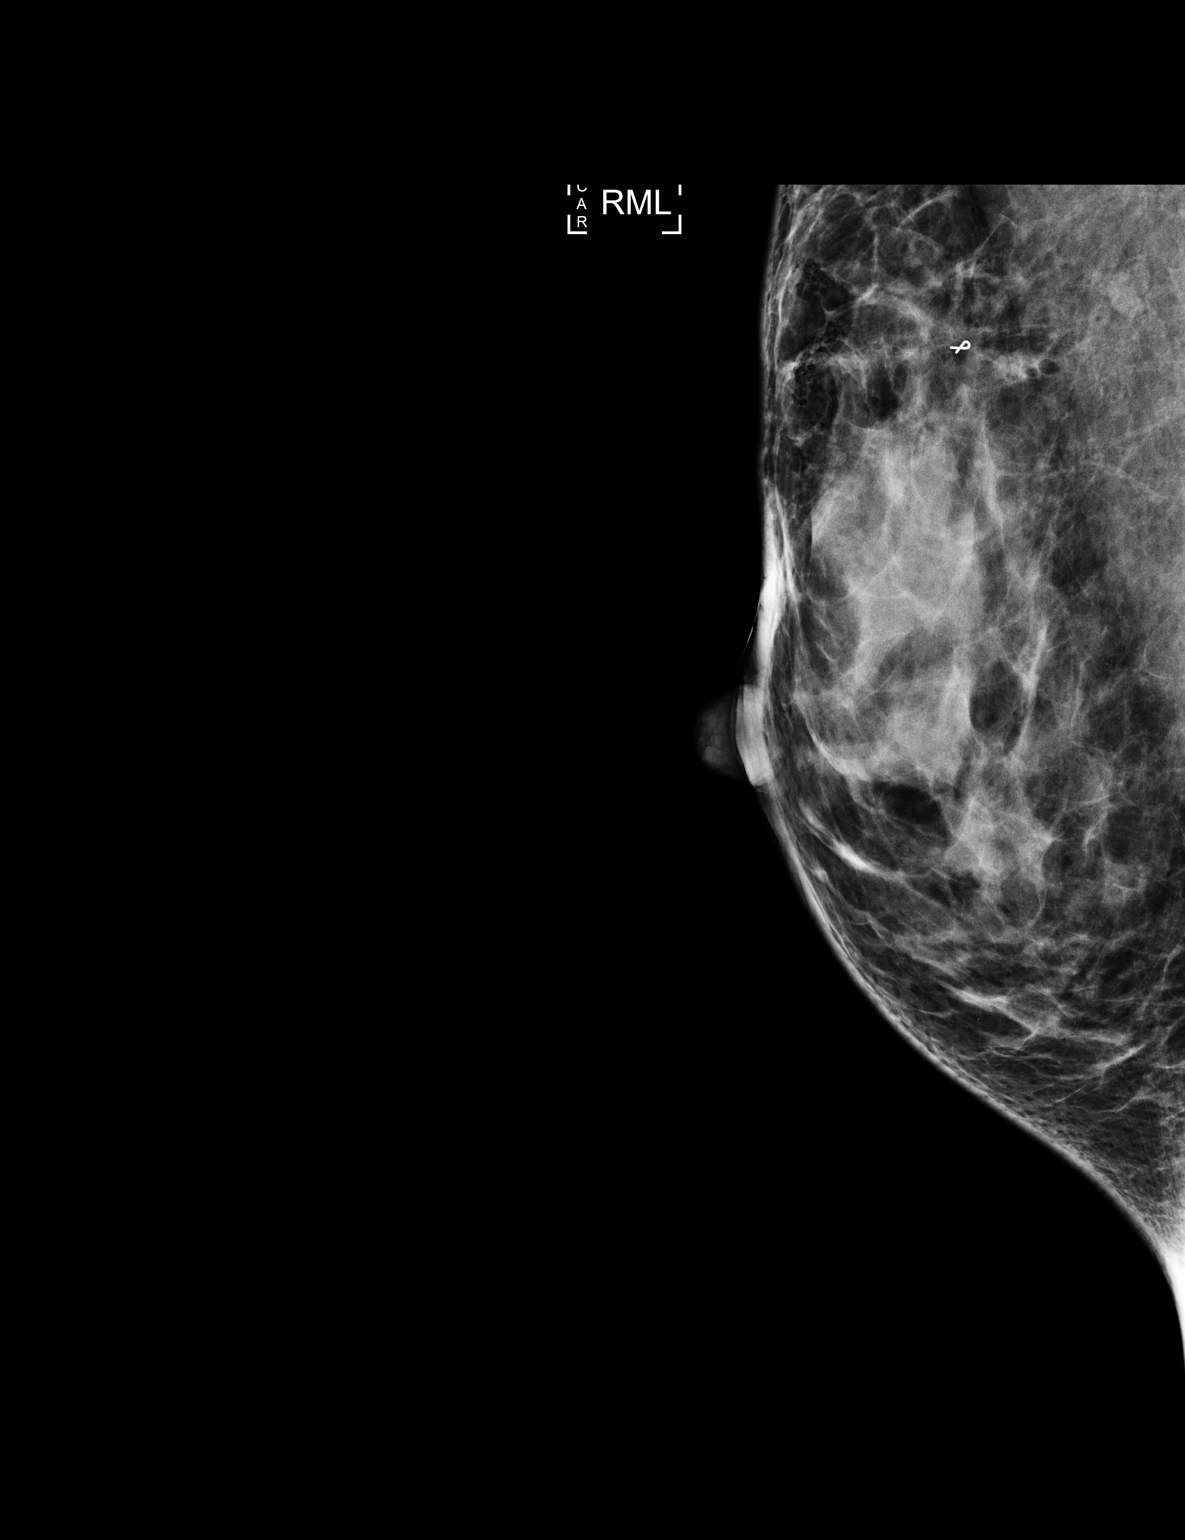

[R CC]
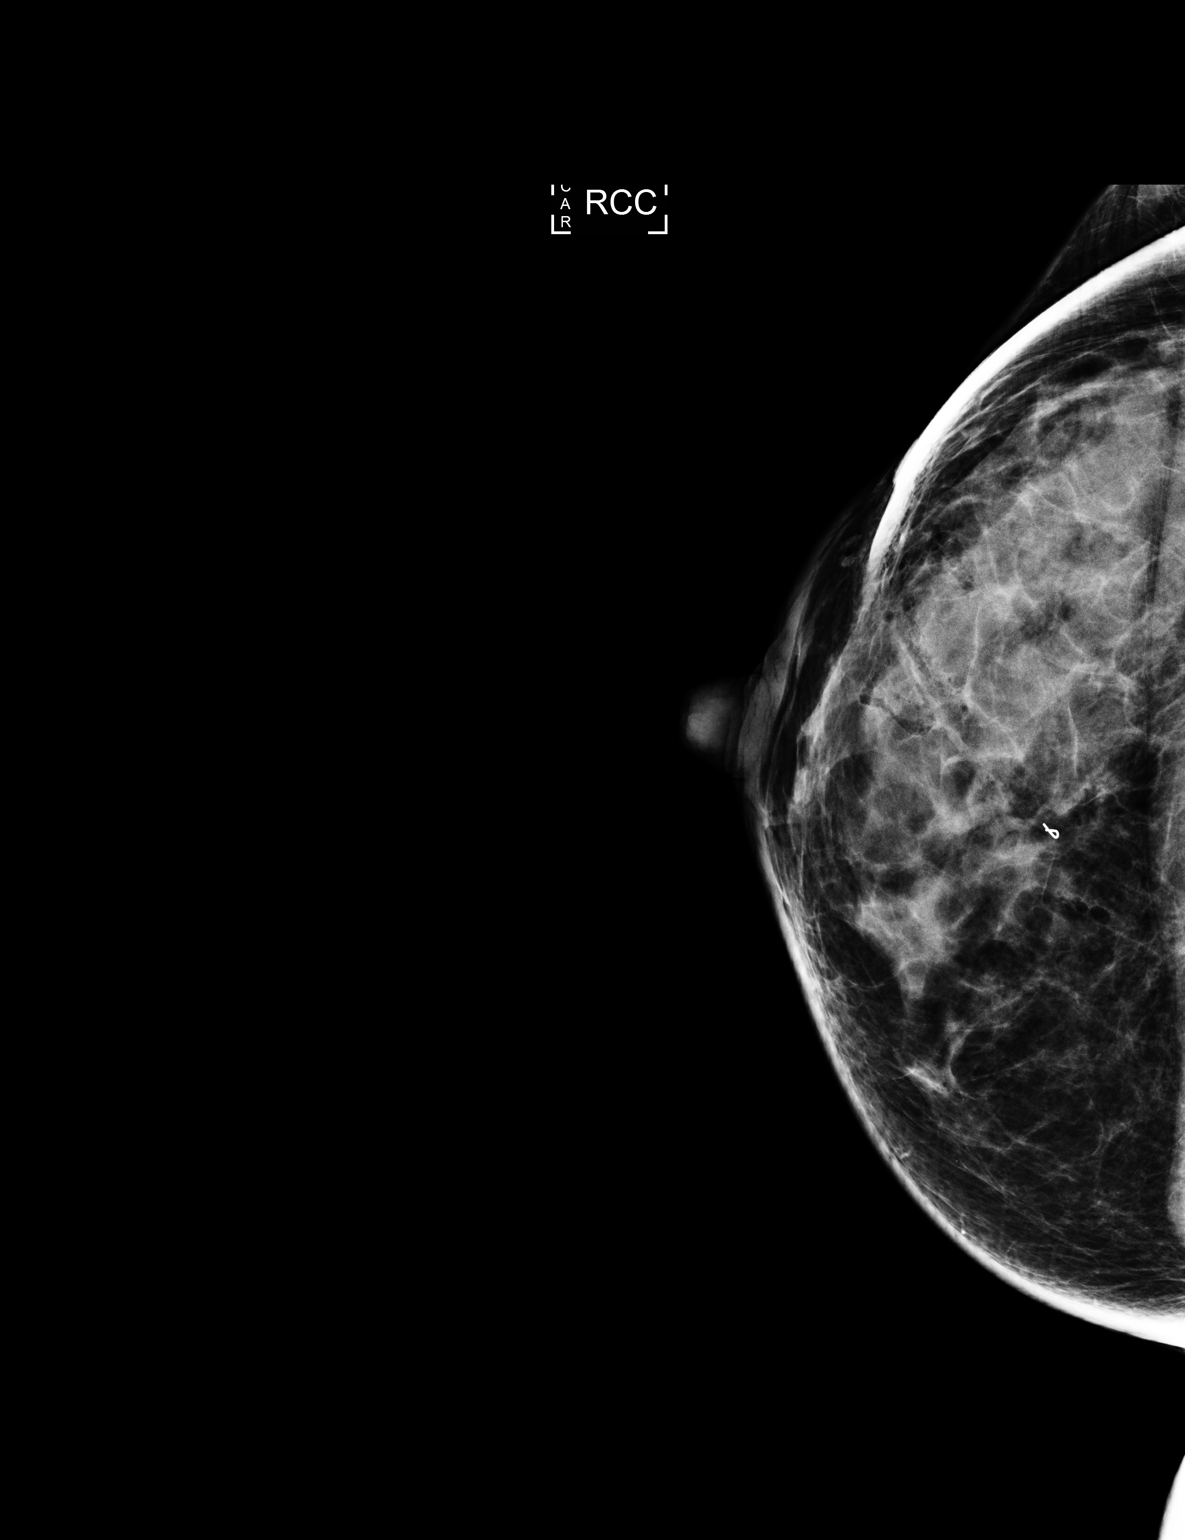

[2 of 2 positions shown; findings below may reference images not displayed]

FINDINGS: Mammographic images were obtained following ultrasound guided biopsy
of a right breast mass/distortion. The ribbon shaped biopsy clip is
in good position.
IMPRESSION: The ribbon shaped biopsy clip is in good position.

Final Assessment: Post Procedure Mammograms for Marker Placement

## 2016-08-06 IMAGING — US US BREAST BX W LOC DEV 1ST LESION IMG BX SPEC US GUIDE*R*
1 series · 13 of 13 positions shown · non-contrast
Comparison: Previous exam(s).

ADDENDUM:
Pathology revealed grade II invasive mammary carcinoma and atypical
lobular carcinoma in the right breast. This was found to be
concordant by Dr. BEBESITA. Pathology results were discussed
with the patient by telephone. The patient reported doing well after
the biopsy. Post biopsy instructions and care were reviewed and
questions were answered. The patient was encouraged to call The
Surgical consultation has been arranged with Dr. BEBESITA at
[REDACTED], at the request of the patient,
on [DATE].

Pathology results reported by BEBESITA RN, BSN on [DATE].
CLINICAL DATA: Biopsy for right breast distortion
EXAM:
ULTRASOUND GUIDED RIGHT BREAST CORE NEEDLE BIOPSY

[Series 1: us breast bx w loc dev 1st lesion img bx spec us g · 0.07mm/px · 13 of 13 slices shown]
[im 1/13]
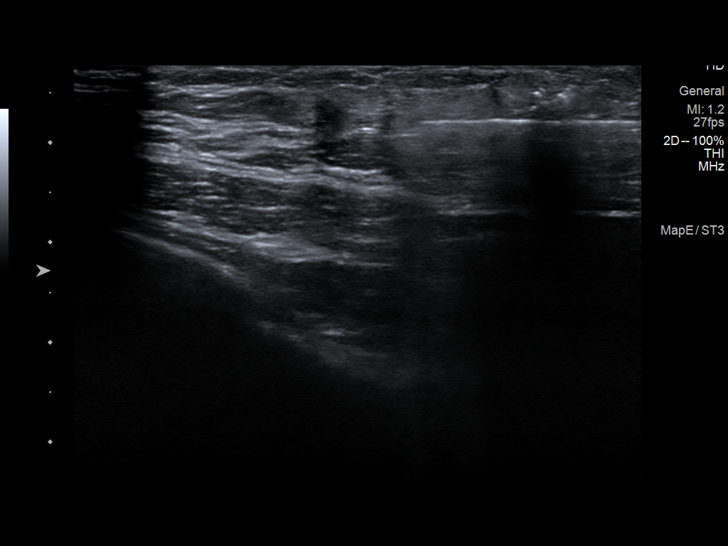
[im 2/13]
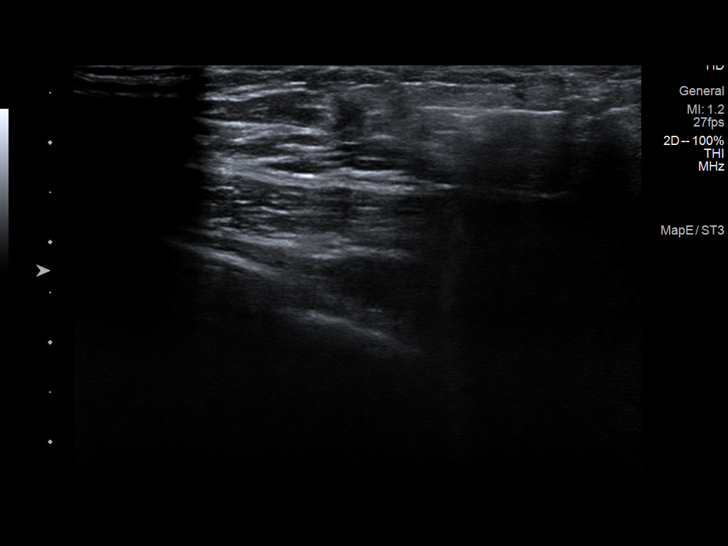
[im 3/13]
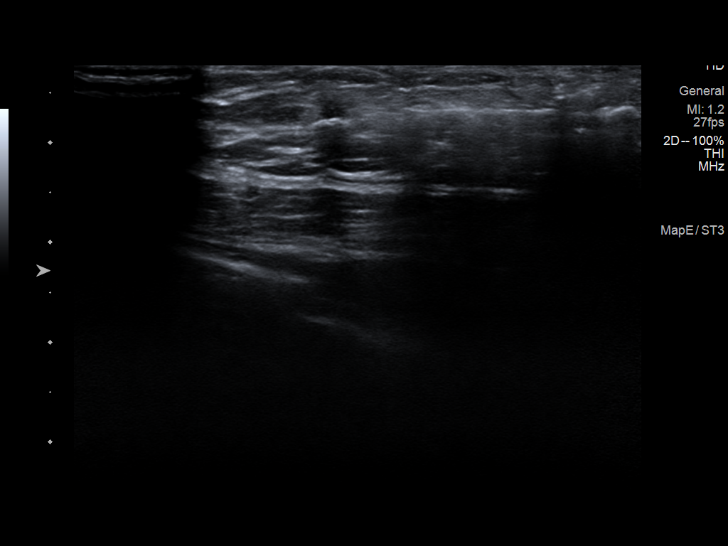
[im 4/13]
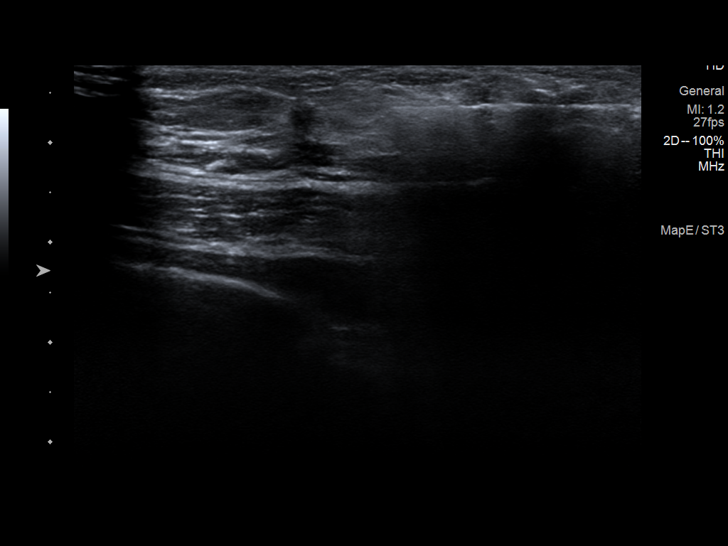
[im 5/13]
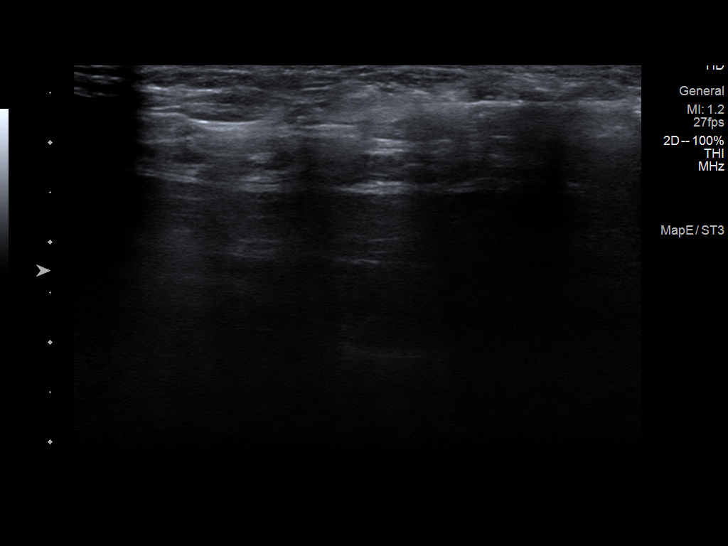
[im 6/13]
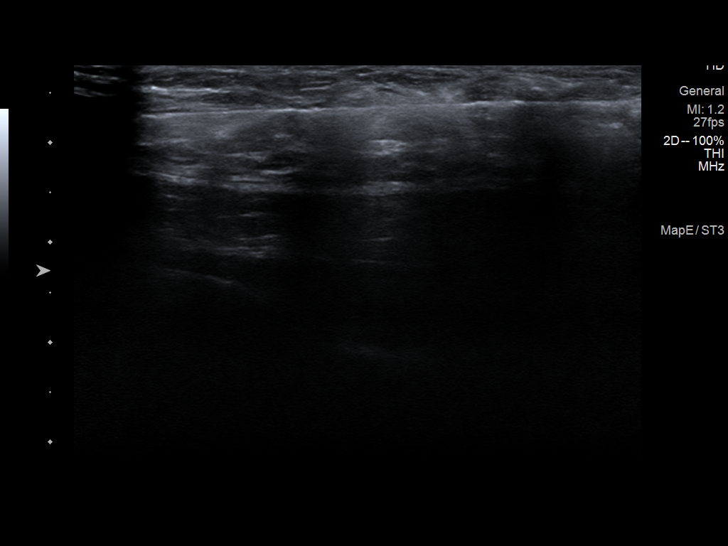
[im 7/13]
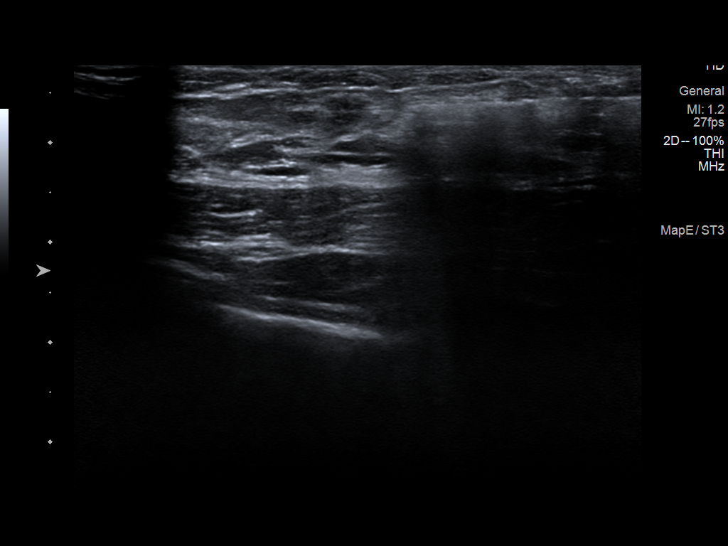
[im 8/13]
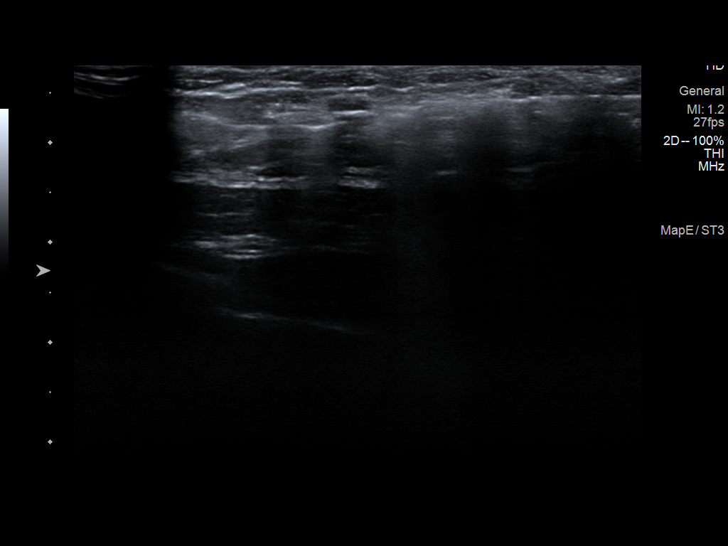
[im 9/13]
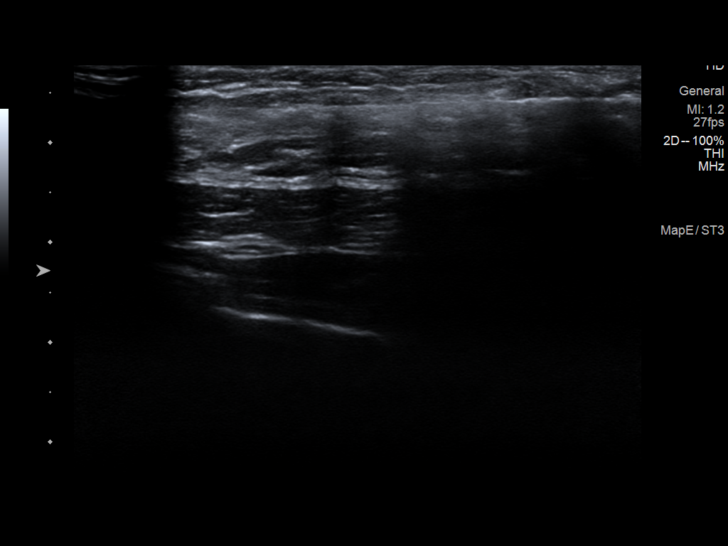
[im 10/13]
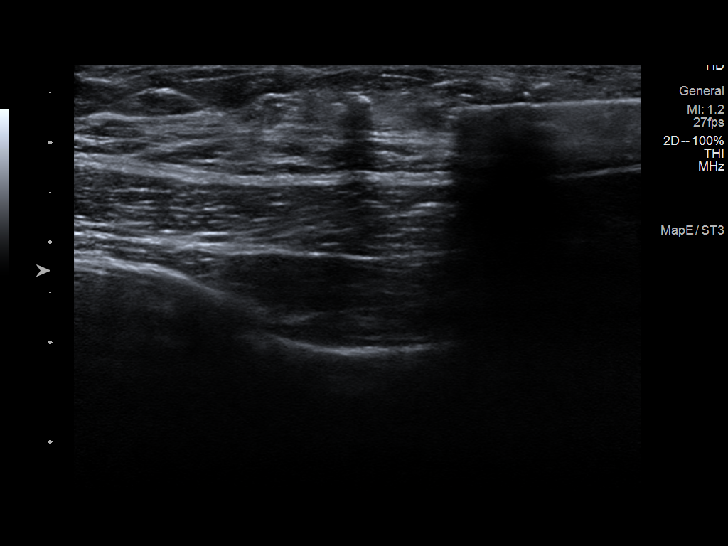
[im 11/13]
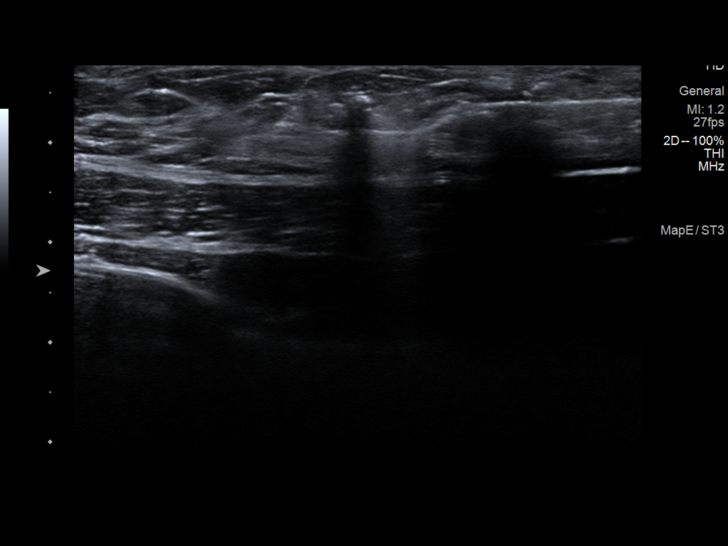
[im 12/13]
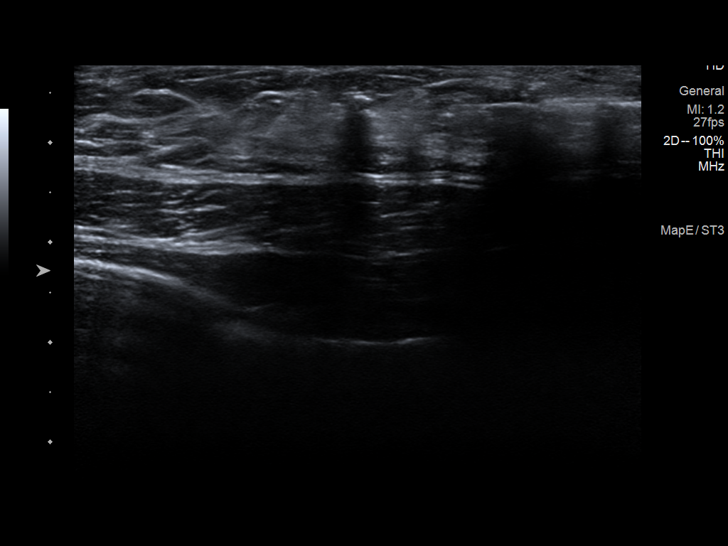
[im 13/13]
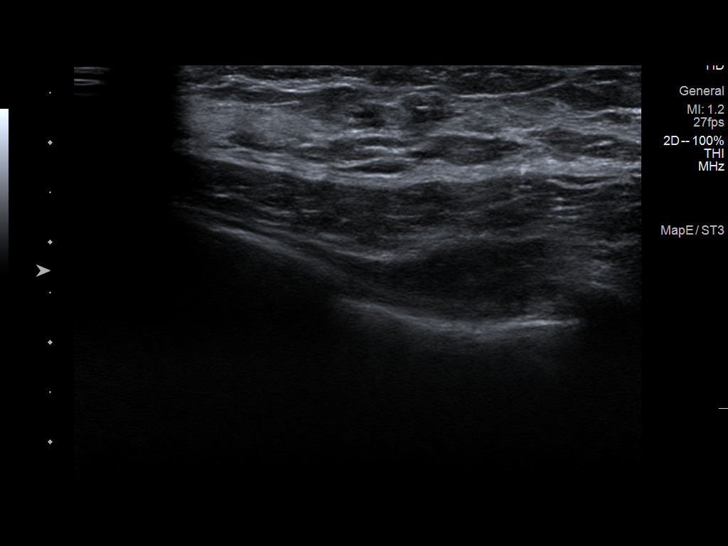

[13 of 13 positions shown; findings below may reference images not displayed]



Using sterile technique and 1% Lidocaine as local anesthetic, under
direct ultrasound visualization, a 12 gauge BEBESITA device was
used to perform biopsy of right breast distortion/mass using a
lateral approach. At the conclusion of the procedure a ribbon shaped
tissue marker clip was deployed into the biopsy cavity. Follow up 2
view mammogram was performed and dictated separately.
IMPRESSION: Ultrasound guided biopsy of right breast distortion/mass. No
apparent complications.

## 2016-08-10 ENCOUNTER — Ambulatory Visit: Payer: Self-pay | Admitting: Surgery

## 2016-08-10 DIAGNOSIS — C50411 Malignant neoplasm of upper-outer quadrant of right female breast: Secondary | ICD-10-CM

## 2016-08-10 DIAGNOSIS — C50911 Malignant neoplasm of unspecified site of right female breast: Secondary | ICD-10-CM | POA: Diagnosis not present

## 2016-08-10 NOTE — H&P (Signed)
[name] 08/10/2016 9:10 AM Location: [facility] Patient #: [id number] DOB: August 23, 1970 Divorced / Language: [name] / Race: Refused to Report/Unreported Female  History of Present Illness Janice Moores A. Rahm Minix MD; 08/10/2016 10:46 AM) Patient words: Patient sent at the request of Dr. Dorise Pope for an abnormal mammogram. She underwent mammography this month had an area of distortion in her right breast upper outer quadrant. The area measured about 7 millimeters. 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 Patient denies history of breast lump, breast pain, nipple discharge or any change in her appearance or examination of either breast. There is no family history of breast cancer or other malignancy. She is healthy and exercises daily and drinks alcohol in a social setting. Denies tobacco use.                     ADDITIONAL INFORMATION: E-cadherin is negative supporting a lobular phenotype. Janice Males MD Pathologist, Electronic Signature ( Signed 08/07/2016) FINAL DIAGNOSIS Diagnosis Breast, right, needle core biopsy, 12:30 o'clock - INVASIVE MAMMARY CARCINOMA, SEE COMMENT. - LOBULAR NEOPLASIA (ATYPICAL LOBULAR HYPERPLASIA). Microscopic Comment The carcinoma appears grade 2. E-cadherin will be ordered and reported in an addendum. Prognostic markers will be ordered. Dr. Lyndon Pope has reviewed the case. The case was called to [facility] on 08/07/2016. Janice Males MD Pathologist, Electronic Signature (Case signed 08/07/2016) Specimen Gross and Clinical Information Specimen Comment In formalin 8:20 am extracted less than 1 min; distortion/mass Specimen(s) Obtained: Breast, right, needle core biopsy, 12:30 o'clock Specimen Clinical Information Distortion/mass 1 of 2 FINAL for Janice, Pope ([id number]) Gross The specimen  is received in formalin labeled with the patient's name and right breast 12:30, and consists of four cores of tan yellow fibroadipose tissue, ranging from 0.8 x 0.2 x 0.1 cm to 1.3 x 0.2 x 0.2 cm. The specimen is entirely submitted in one cassette. Time in formalin 8:20 a.m. on 08/06/16. CIT is less than 1 minute. (KL:kh 08-06-16) Stain(s) used in Diagnosis: The following stain(s) were used in diagnosing the case: E-CAD. The control(s) stained appropriately. Disclaimer Estrogen receptor (6F11), immunohistochemical stains are performed on formalin fixed, paraffin embedded tissue using a 3,3"-diaminobenzidine (DAB) chromogen and Leica Bond Autostainer System. The staining intensity of the nucleus is scored manually and is reported as the percentage of tumor cell nuclei demonstrating specific nuclear staining. Ki-67 (MM1), immunohistochemical stains are performed on formalin fixed, paraffin embedded tissue using a 3,3"-diaminobenzidine (DAB) chromogen and Leica Bond Autostainer System. The staining intensity of the nucleus is scored manually and is reported as the percentage of tumor cell nuclei demonstrating specific nuclear staining. HER2 IQFISH pharmDX (Pope [phone]) is a direct fluorescence in-situ hybridization assay designed to quantitatively determine HER2 gene amplification in formalin-fixed, paraffin-embedded tissue specimens. It is performed at [facility] and is reported using ASCO/CAP scoring criteria published in 2013. Some of these immunohistochemical stains may have been developed and the performance characteristics determined by [facility]. Some may not have been cleared or approved by the U.S. Food and Drug Administration. The FDA has determined that such clearance or approval is not necessary. This test is used for clinical purposes. It should not be regarded as investigational or for research. This laboratory is certified under the [place] (CLIA-88) as qualified to perform high complexity clinical laboratory testing. PR progesterone receptor (16), immunohistochemical stains are performed on formalin fixed,  paraffin embedded tissue using a 3,3"-diaminobenzidine (DAB) chromogen and [place]. The staining intensity of the nucleus is scored manually and is reported as the percentage of tumor cell nuclei demonstrating specific nuclear staining. Report signed out from the following location(s) Technical Component was performed at [facility]. 75                 CLINICAL DATA: Callback from screening mammogram for possible right breast distortion  EXAM: 2D DIGITAL DIAGNOSTIC RIGHT MAMMOGRAM WITH CAD AND ADJUNCT TOMO  ULTRASOUND RIGHT BREAST  COMPARISON: Previous exam(s).  ACR Breast Density Category c: The breast tissue is heterogeneously dense, which may obscure small masses.  FINDINGS: MLO spot-compression and full lateral views of the right breast were performed with tomosynthesis. On the additional views, there is a small persistent area of distortion within the superior right breast. This appears to be within the slightly medial right breast, posterior depth on the CC view of the screening mammogram.  Mammographic images were processed with CAD.  Targeted ultrasound of the right breast was performed demonstrating an irregular hypoechoic mass at 12:30, 5 cm from the nipple measuring 6 x 4 x 7 mm which is thought to correspond to the area of distortion seen mammographically. Targeted ultrasound of the right axilla demonstrates no suspicious appearing axillary lymph nodes.  IMPRESSION: Indeterminate right breast mass at 12:30, 5 cm from the nipple.  RECOMMENDATION: Ultrasound-guided right breast biopsy.  I have discussed the findings and recommendations with the patient. Results were also provided in writing at the conclusion of the visit. If  applicable, a reminder letter will be sent to the patient regarding the next appointment.  BI-RADS CATEGORY 4: Suspicious.   Electronically Signed By: [name] M.D. On: 08/05/2016 14:11.  The patient is a 46 year old female.   Diagnostic Studies History [name], [place]; 08/10/2016 9:10 AM) Colonoscopy never Mammogram within last year Pap Smear 1-5 years ago  Allergies [name], RMA; 08/10/2016 9:12 AM) Azithromycin *CHEMICALS* Vomiting.  Medication History [name], RMA; 08/10/2016 9:12 AM) Lo Loestrin Fe (1 MG-10 MCG /10 MCG Tablet, Oral) Active. Medications Reconciled  Social History [name], [place]; 08/10/2016 9:10 AM) Alcohol use Moderate alcohol use. Caffeine use Carbonated beverages, Coffee, Tea. No drug use Tobacco use Never smoker.  Family History [name], [place]; 08/10/2016 9:10 AM) Cerebrovascular Accident Father. Diabetes Mellitus Father. Kidney Disease Father.  Pregnancy / Birth History [name], [place]; 08/10/2016 9:10 AM) Age at menarche 22 years. Age of menopause 57-50 Contraceptive History Oral contraceptives. Gravida 2 Irregular periods Maternal age 36-40 Para 0  Other Problems [name], [place]; 08/10/2016 9:10 AM) Breast Cancer     Review of Systems [name] RMA; 08/10/2016 9:10 AM) General Not Present- Appetite Loss, Chills, Fatigue, Fever, Night Sweats, Weight Gain and Weight Loss. Skin Not Present- Change in Wart/Mole, Dryness, Hives, Jaundice, New Lesions, Non-Healing Wounds, Rash and Ulcer. HEENT Present- Seasonal Allergies. Not Present- Earache, Hearing Loss, Hoarseness, Nose Bleed, Oral Ulcers, Ringing in the Ears, Sinus Pain, Sore Throat, Visual Disturbances, Wears glasses/contact lenses and Yellow Eyes. Respiratory Not Present- Bloody sputum, Chronic Cough, Difficulty Breathing, Snoring and Wheezing. Breast Present- Breast Mass. Not Present- Breast Pain,  Nipple Discharge and Skin Changes. Cardiovascular Not Present- Chest Pain, Difficulty Breathing Lying Down, Leg Cramps, Palpitations, Rapid Heart Rate, Shortness of Breath and Swelling of Extremities. Gastrointestinal Not Present- Abdominal Pain, Bloating, Bloody Stool, Change in Bowel Habits, Chronic diarrhea, Constipation, Difficulty Swallowing, Excessive gas, Gets full quickly  at meals, Hemorrhoids, Indigestion, Nausea, Rectal Pain and Vomiting. Female Genitourinary Not Present- Frequency, Nocturia, Painful Urination, Pelvic Pain and Urgency. Musculoskeletal Not Present- Back Pain, Joint Pain, Joint Stiffness, Muscle Pain, Muscle Weakness and Swelling of Extremities. Neurological Not Present- Decreased Memory, Fainting, Headaches, Numbness, Seizures, Tingling, Tremor, Trouble walking and Weakness. Psychiatric Not Present- Anxiety, Bipolar, Change in Sleep Pattern, Depression, Fearful and Frequent crying. Endocrine Not Present- Cold Intolerance, Excessive Hunger, Hair Changes, Heat Intolerance, Hot flashes and New Diabetes. Hematology Not Present- Blood Thinners, Easy Bruising, Excessive bleeding, Gland problems, HIV and Persistent Infections.  Vitals [name] RMA; 08/10/2016 9:12 AM) 08/10/2016 9:12 AM Weight: 143.6 lb Height: 67in Body Surface Area: 1.76 m Body Mass Index: 22.49 kg/m  Temp.: 98.45F  Pulse: 85 (Regular)  BP: 130/80 (Sitting, Left Arm, Standard)      Physical Exam (Jari Dipasquale A. Marquisha Nikolov MD; 08/10/2016 10:46 AM)  General Mental Status-Alert. General Appearance-Consistent with stated age. Hydration-Well hydrated. Voice-Normal.  Head and Neck Head-normocephalic, atraumatic with no lesions or palpable masses. Trachea-midline. Thyroid Gland Characteristics - normal size and consistency.  Eye Eyeball - Bilateral-Extraocular movements intact. Sclera/Conjunctiva - Bilateral-No scleral icterus.  Chest and Lung Exam Chest and lung exam  reveals -quiet, even and easy respiratory effort with no use of accessory muscles and on auscultation, normal breath sounds, no adventitious sounds and normal vocal resonance. Inspection Chest Wall - Normal. Back - normal.  Breast Breast - Left-Symmetric, Non Tender, No Biopsy scars, no Dimpling, No Inflammation, No Lumpectomy scars, No Mastectomy scars, No Peau d' [place]. Breast - Right-Symmetric, Non Tender, No Biopsy scars, no Dimpling, No Inflammation, No Lumpectomy scars, No Mastectomy scars, No Peau d' [place]. Breast Lump-No Palpable Breast Mass.  Cardiovascular Cardiovascular examination reveals -normal heart sounds, regular rate and rhythm with no murmurs and normal pedal pulses bilaterally.  Musculoskeletal Normal Exam - Left-Upper Extremity Strength Normal and Lower Extremity Strength Normal. Normal Exam - Right-Upper Extremity Strength Normal and Lower Extremity Strength Normal.  Lymphatic Head & Neck  General Head & Neck Lymphatics: Bilateral - Description - Normal. Axillary  General Axillary Region: Bilateral - Description - Normal. Tenderness - Non Tender.    Assessment & Plan (Jacorian Golaszewski A. Ryett Hamman MD; 08/10/2016 10:47 AM)  BREAST CANCER, RIGHT (C50.911) Impression: Discussed treatment options of breast conservation versus mastectomy. The pros and cons of long-term expectations of each type of surgery were discussed. Potential complications of each. Discussed breast reconstruction. She is opted for right breast needle localized partial mastectomy with right axillary sentinel lymph node mapping. Risk of lumpectomy include bleeding, infection, seroma, more surgery, use of seed/wire, wound care, cosmetic deformity and the need for other treatments, death , blood clots, death. Pt agrees to proceed. Risk of sentinel lymph node mapping include bleeding, infection, lymphedema, shoulder pain. stiffness, dye allergy. cosmetic deformity , blood clots, death, need for more  surgery. Pt agres to proceed.    Referral to medical and radiation oncology  She will need a breast MRI due to her lobular subtype.  Not sure she is a candidate for genetic counseling given no family history  Current Plans You are being scheduled for surgery- Our schedulers will call you.  You should hear from our office's scheduling department within 5 working days about the location, date, and time of surgery. We try to make accommodations for patient's preferences in scheduling surgery, but sometimes the OR schedule or the surgeon's schedule prevents [place] from making those accommodations.  If you have not heard from our office [phone])  in 5 working days, call the office and ask for your surgeon's nurse.  If you have other questions about your diagnosis, plan, or surgery, call the office and ask for your surgeon's nurse.  Pt Education - [facility] Breast Cancer Information Given - [facility] "Breast Journey" Package We discussed the staging and pathophysiology of breast cancer. We discussed all of the different options for treatment for breast cancer including surgery, chemotherapy, radiation therapy, Herceptin, and antiestrogen therapy. We discussed a sentinel lymph node biopsy as she does not appear to having lymph node involvement right now. We discussed the performance of that with injection of radioactive tracer and blue dye. We discussed that she would have an incision underneath her axillary hairline. We discussed that there is a bout a 10-20% chance of having a positive node with a sentinel lymph node biopsy and we will await the permanent pathology to make any other first further decisions in terms of her treatment. One of these options might be to return to the operating room to perform an axillary lymph node dissection. We discussed about a 1-2% risk lifetime of chronic shoulder pain as well as lymphedema associated with a sentinel lymph node biopsy. We discussed the options for  treatment of the breast cancer which included lumpectomy versus a mastectomy. We discussed the performance of the lumpectomy with a wire placement. We discussed a 10-20% chance of a positive margin requiring reexcision in the operating room. We also discussed that she may need radiation therapy or antiestrogen therapy or both if she undergoes lumpectomy. We discussed the mastectomy and the postoperative care for that as well. We discussed that there is no difference in her survival whether she undergoes lumpectomy with radiation therapy or antiestrogen therapy versus a mastectomy. There is a slight difference in the local recurrence rate being 3-5% with lumpectomy and about 1% with a mastectomy. We discussed the risks of operation including bleeding, infection, possible reoperation. She understands her further therapy will be based on what her stages at the time of her operation.  Pt Education - [facility] breast cancer surgery: discussed with patient and provided information. Pt Education - ABC (After Breast Cancer) Class Info: discussed with patient and provided information.

## 2016-08-11 ENCOUNTER — Encounter: Payer: Self-pay | Admitting: Radiation Oncology

## 2016-08-11 ENCOUNTER — Other Ambulatory Visit: Payer: Self-pay | Admitting: Surgery

## 2016-08-11 DIAGNOSIS — C50911 Malignant neoplasm of unspecified site of right female breast: Secondary | ICD-10-CM

## 2016-08-14 ENCOUNTER — Encounter: Payer: Self-pay | Admitting: Hematology

## 2016-08-14 ENCOUNTER — Telehealth: Payer: Self-pay | Admitting: Hematology

## 2016-08-14 NOTE — Telephone Encounter (Signed)
Appt has been scheduled for the pt to see Dr. Burr Medico on 4/12 at 11am. Pt agreed to the appt date and time. Demographics verified. Aware to arrive 30 minutes early. Letter mailed.

## 2016-08-16 DIAGNOSIS — C50919 Malignant neoplasm of unspecified site of unspecified female breast: Secondary | ICD-10-CM

## 2016-08-16 HISTORY — DX: Malignant neoplasm of unspecified site of unspecified female breast: C50.919

## 2016-08-17 NOTE — Progress Notes (Signed)
Location of Breast Cancer:Right Breast  12:30 position Upper Outer Quadrant  Histology per Pathology Report: 08/06/16: Breast, right, needle core biopsy, 12:30 o'clock - INVASIVE MAMMARY CARCINOMA, SEE COMMENT. - LOBULAR NEOPLASIA (ATYPICAL LOBULAR HYPERPLASIA  Receptor Status: ER(10%+), PR (90%+), Her2-neu  neg(ratio 1.64), Ki-(10%)  Did patient present with symptoms (if so, please note symptoms) or was this found on screening mammography?: Routine screening   Past/Anticipated interventions by surgeon, if any: Dr. Erroll Luna, MD scheduled Thursday 08/27/16  Past/Anticipated interventions by medical oncology, if any: Chemotherapy : Dr. Burr Medico 08/27/16  Lymphedema issues, if any:  No   Pain issues, if any: soreness from bs  MR Breast 08/19/2016 scheduled done  SAFETY ISSUES:  Prior radiation? NO  Pacemaker/ICD? NO  Possible current pregnancy?  Is the patient on methotrexate? NO  Current Complaints / other details:  Menarche age 74, G2P0,moderate alcohol use,  3 x week,  no tobacco use  Father CVAS,DM,Kidney disease,   BP (!) 121/57 (BP Location: Left Arm, Patient Position: Sitting, Cuff Size: Normal)   Pulse 66   Temp 98.6 F (37 C) (Oral)   Resp 16   Ht 5' 7"  (1.702 m)   Wt 140 lb 9.6 oz (63.8 kg)   LMP 08/08/2016 (Approximate)   BMI 22.02 kg/m   Wt Readings from Last 3 Encounters:  08/24/16 140 lb 9.6 oz (63.8 kg)   Rebecca Eaton, RN 08/17/2016,8:17 AM

## 2016-08-19 ENCOUNTER — Ambulatory Visit
Admission: RE | Admit: 2016-08-19 | Discharge: 2016-08-19 | Disposition: A | Discharge status: Home / Self Care | Payer: 59 | Source: Ambulatory Visit | Attending: Surgery | Admitting: Surgery

## 2016-08-19 DIAGNOSIS — C50911 Malignant neoplasm of unspecified site of right female breast: Secondary | ICD-10-CM

## 2016-08-19 MED ORDER — GADOBENATE DIMEGLUMINE 529 MG/ML IV SOLN
12.0000 mL | Freq: Once | INTRAVENOUS | Status: AC | PRN
  Administered 2016-08-19: 12 mL via INTRAVENOUS

## 2016-08-21 ENCOUNTER — Encounter (HOSPITAL_BASED_OUTPATIENT_CLINIC_OR_DEPARTMENT_OTHER): Payer: Self-pay | Admitting: *Deleted

## 2016-08-21 NOTE — Pre-Procedure Instructions (Signed)
To come pick up Boost Breeze 8 oz. - to drink by 0830 DOS

## 2016-08-24 ENCOUNTER — Telehealth: Payer: Self-pay | Admitting: Hematology

## 2016-08-24 ENCOUNTER — Ambulatory Visit
Admission: RE | Admit: 2016-08-24 | Discharge: 2016-08-24 | Disposition: A | Discharge status: Home / Self Care | Payer: 59 | Source: Ambulatory Visit | Attending: Radiation Oncology | Admitting: Radiation Oncology

## 2016-08-24 ENCOUNTER — Encounter: Payer: Self-pay | Admitting: Radiation Oncology

## 2016-08-24 VITALS — BP 121/57 | HR 66 | Temp 98.6°F | Resp 16 | Ht 67.0 in | Wt 140.6 lb

## 2016-08-24 DIAGNOSIS — Z510 Encounter for antineoplastic radiation therapy: Secondary | ICD-10-CM | POA: Insufficient documentation

## 2016-08-24 DIAGNOSIS — C50411 Malignant neoplasm of upper-outer quadrant of right female breast: Secondary | ICD-10-CM

## 2016-08-24 DIAGNOSIS — Z9889 Other specified postprocedural states: Secondary | ICD-10-CM | POA: Diagnosis not present

## 2016-08-24 DIAGNOSIS — Z888 Allergy status to other drugs, medicaments and biological substances status: Secondary | ICD-10-CM | POA: Insufficient documentation

## 2016-08-24 DIAGNOSIS — Z170 Estrogen receptor positive status [ER+]: Secondary | ICD-10-CM

## 2016-08-24 DIAGNOSIS — Z79899 Other long term (current) drug therapy: Secondary | ICD-10-CM | POA: Insufficient documentation

## 2016-08-24 NOTE — Progress Notes (Signed)
Please see the Nurse Progress Note in the MD Initial Consult Encounter for this patient. 

## 2016-08-24 NOTE — Telephone Encounter (Signed)
Pt called to reschedule her appt after sx, scheduled on 4/12. Appt has been rescheduled for the pt to see Dr. Burr Medico on 4/26 at 11am. Pt agreed to the appt date and time.

## 2016-08-24 NOTE — Progress Notes (Signed)
Radiation Oncology         [phone] ________________________________  Name: [name] MRN: [id number]  Date: 08/24/2016  DOB: 1971-03-29  XL:KGMWNUU,VOZDGU A, MD  Erroll Luna, MD     REFERRING PHYSICIAN: Erroll Luna, MD   DIAGNOSIS: The encounter diagnosis was Malignant neoplasm of upper-outer quadrant of right breast in female, estrogen receptor positive ([place]).:  [id number]     HISTORY OF PRESENT ILLNESS::[name] is a 46 y.o. female who is seen for an initial consultation visit regarding the patient's diagnosis of breast cancer.  The patient presented with an abnormal right breast distortion on her screening mammogram. Diagnostic mammogram on 08/05/16 showed an indeterminate right breast mass at the 12:30 position, 5 cm from the nipple.  A biopsy was performed. 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 Receptors studies were completed and indicate that the tumor is estrogen receptor 10%, progesterone receptor 90%, and Her-2/neu -. The Ki-67 staining was 10%.  Bilateral breast MRI on 08/19/16 showed a solitary enhancing mass in the upper-inner quadrant of the right breast.   The patient is scheduled for a consult with Dr. Burr Medico of medical oncology on 08/27/16. This will be immediately followed by a right breast lumpectomy on 08/27/16 with sentinel lymph node biopsy by Dr. Brantley Stage.  Patient notes pain within the biopsy site at this time. She denies lymphedema.  PREVIOUS RADIATION THERAPY: No   PAST MEDICAL HISTORY:  has a past medical history of Breast cancer ([place]) (08/2016) and Dental crown present.     PAST SURGICAL HISTORY: Past Surgical History:  Procedure Laterality Date  . DILATION AND EVACUATION  04/08/2008; 08/01/2008     FAMILY HISTORY: family history is not on file.   SOCIAL HISTORY:  reports that she has never smoked. She has never used smokeless tobacco. She reports that she drinks alcohol.  She reports that she does not use drugs.   ALLERGIES: Erythromycin   MEDICATIONS:  Current Outpatient Prescriptions  Medication Sig Dispense Refill  . ALPRAZolam (XANAX) 0.25 MG tablet Take 0.25 mg by mouth at bedtime as needed for anxiety.    . fexofenadine (ALLEGRA) 180 MG tablet Take 180 mg by mouth daily.    Marland Kitchen FIBER ADULT GUMMIES PO Take by mouth daily.     . Melatonin 1 MG TABS Take 1 tablet by mouth daily as needed.    . Multiple Vitamin (MULTIVITAMIN) tablet Take 1 tablet by mouth daily.    . Norethindrone-Ethinyl Estradiol-Fe Biphas (LO LOESTRIN FE) 1 MG-10 MCG / 10 MCG tablet Take 1 tablet by mouth daily.    . vitamin C (ASCORBIC ACID) 500 MG tablet Take 500 mg by mouth daily.     No current facility-administered medications for this encounter.      REVIEW OF SYSTEMS:  A complete review of systems was obtained with pertinent positives identified in the history of present illness.      PHYSICAL EXAM:  height is _0  (1.702 m) and weight is 140 lb 9.6 oz (63.8 kg). Her oral temperature is 98.6 F (37 C). Her blood pressure is 121/57 (abnormal) and her pulse is 66. Her respiration is 16.   ECOG = 0  0 - Asymptomatic (Fully active, able to carry on all predisease activities without restriction)  1 - Symptomatic but completely ambulatory (Restricted in physically strenuous activity but ambulatory and able to carry out work of a light or sedentary nature. For example, light housework, office work)  2 -  Symptomatic, <50% in bed during the day (Ambulatory and capable of all self care but unable to carry out any work activities. Up and about more than 50% of waking hours)  3 - Symptomatic, >50% in bed, but not bedbound (Capable of only limited self-care, confined to bed or chair 50% or more of waking hours)  4 - Bedbound (Completely disabled. Cannot carry on any self-care. Totally confined to bed or chair)  5 - Death   Eustace Pen MM, Creech RH, Tormey DC, et al. [phone]). "Toxicity  and response criteria of the [facility]". [place] Oncol. 5 (6): 649-55  General: Well-developed, in no acute distress HEENT: Normocephalic, atraumatic; oral cavity clear Neck: Supple without any lymphadenopathy Cardiovascular: Regular rate and rhythm Respiratory: Clear to auscultation bilaterally Breasts: Palpable area after biopsy in the upper aspect of the right breast. No significant bruising present. No axillary adenopathy. Negative breast exam on the left. GI: Soft, nontender, normal bowel sounds Extremities: No edema present Neuro: No focal deficits     LABORATORY DATA:  Lab Results  Component Value Date   WBC 8.5 08/01/2008   HGB 14.2 08/01/2008   HCT 41.5 08/01/2008   MCV 94.2 08/01/2008   PLT 212 08/01/2008   No results found for: NA, K, CL, CO2 No results found for: ALT, AST, GGT, ALKPHOS, BILITOT    RADIOGRAPHY: Mr Breast Bilateral W Wo Contrast  Result Date: 08/19/2016 CLINICAL DATA:  Biopsy proven right breast invasive mammary carcinoma and atypical lobular carcinoma. LABS:  None obtained at the time of imaging. EXAM: BILATERAL BREAST MRI WITH AND WITHOUT CONTRAST TECHNIQUE: Multiplanar, multisequence MR images of both breasts were obtained prior to and following the intravenous administration of 12 ml of MultiHance. THREE-DIMENSIONAL MR IMAGE RENDERING ON INDEPENDENT WORKSTATION: Three-dimensional MR images were rendered by post-processing of the original MR data on an independent workstation. The three-dimensional MR images were interpreted, and findings are reported in the following complete MRI report for this study. Three dimensional images were evaluated at the independent DynaCad workstation COMPARISON:  Previous exam(s). FINDINGS: Breast composition: d. Extreme fibroglandular tissue. Background parenchymal enhancement: Moderate. Right breast: There is a 7 x 6 x 7 mm enhancing mass in the posterior third of the upper inner quadrant of the  right breast. There is a signal void artifact in the mass from the biopsy clip. Left breast: No mass or abnormal enhancement. Lymph nodes: No abnormal appearing lymph nodes. Ancillary findings:  None. IMPRESSION: Solitary enhancing mass in the upper-inner quadrant of the right breast corresponding with the recently biopsied invasive mammary carcinoma. RECOMMENDATION: Treatment planning of the biopsy proven right breast invasive mammary carcinoma is recommended. BI-RADS CATEGORY  6: Known biopsy-proven malignancy. Electronically Signed   By: Lillia Mountain M.D.   On: 08/19/2016 09:38   US Breast Ltd Uni Right Inc Axilla  Result Date: 08/05/2016 CLINICAL DATA:  Callback from screening mammogram for possible right breast distortion EXAM: 2D DIGITAL DIAGNOSTIC RIGHT MAMMOGRAM WITH CAD AND ADJUNCT TOMO ULTRASOUND RIGHT BREAST COMPARISON:  Previous exam(s). ACR Breast Density Category c: The breast tissue is heterogeneously dense, which may obscure small masses. FINDINGS: MLO spot-compression and full lateral views of the right breast were performed with tomosynthesis. On the additional views, there is a small persistent area of distortion within the superior right breast. This appears to be within the slightly medial right breast, posterior depth on the CC view of the screening mammogram. Mammographic images were processed with CAD. Targeted ultrasound of the  right breast was performed demonstrating an irregular hypoechoic mass at 12:30, 5 cm from the nipple measuring 6 x 4 x 7 mm which is thought to correspond to the area of distortion seen mammographically. Targeted ultrasound of the right axilla demonstrates no suspicious appearing axillary lymph nodes. IMPRESSION: Indeterminate right breast mass at 12:30, 5 cm from the nipple. RECOMMENDATION: Ultrasound-guided right breast biopsy. I have discussed the findings and recommendations with the patient. Results were also provided in writing at the conclusion of the visit.  If applicable, a reminder letter will be sent to the patient regarding the next appointment. BI-RADS CATEGORY  4: Suspicious. Electronically Signed   By: [name] M.D.   On: 08/05/2016 14:11   Mm Diag Breast Tomo Uni Right  Result Date: 08/05/2016 CLINICAL DATA:  Callback from screening mammogram for possible right breast distortion EXAM: 2D DIGITAL DIAGNOSTIC RIGHT MAMMOGRAM WITH CAD AND ADJUNCT TOMO ULTRASOUND RIGHT BREAST COMPARISON:  Previous exam(s). ACR Breast Density Category c: The breast tissue is heterogeneously dense, which may obscure small masses. FINDINGS: MLO spot-compression and full lateral views of the right breast were performed with tomosynthesis. On the additional views, there is a small persistent area of distortion within the superior right breast. This appears to be within the slightly medial right breast, posterior depth on the CC view of the screening mammogram. Mammographic images were processed with CAD. Targeted ultrasound of the right breast was performed demonstrating an irregular hypoechoic mass at 12:30, 5 cm from the nipple measuring 6 x 4 x 7 mm which is thought to correspond to the area of distortion seen mammographically. Targeted ultrasound of the right axilla demonstrates no suspicious appearing axillary lymph nodes. IMPRESSION: Indeterminate right breast mass at 12:30, 5 cm from the nipple. RECOMMENDATION: Ultrasound-guided right breast biopsy. I have discussed the findings and recommendations with the patient. Results were also provided in writing at the conclusion of the visit. If applicable, a reminder letter will be sent to the patient regarding the next appointment. BI-RADS CATEGORY  4: Suspicious. Electronically Signed   By: [name] M.D.   On: 08/05/2016 14:11   Mm Clip Placement Right  Result Date: 08/06/2016 CLINICAL DATA:  Evaluate clip placement EXAM: DIAGNOSTIC RIGHT MAMMOGRAM POST ULTRASOUND BIOPSY COMPARISON:  Previous exam(s). FINDINGS: Mammographic  images were obtained following ultrasound guided biopsy of a right breast mass/distortion. The ribbon shaped biopsy clip is in good position. IMPRESSION: The ribbon shaped biopsy clip is in good position. Final Assessment: Post Procedure Mammograms for Marker Placement Electronically Signed   By: [name] III M.D   On: 08/06/2016 08:42   [place] Rt Breast Bx W Loc Dev 1st Lesion Img Bx Spec US Guide  Addendum Date: 08/07/2016   ADDENDUM REPORT: 08/07/2016 10:03 ADDENDUM: Pathology revealed grade II invasive mammary carcinoma and atypical lobular carcinoma in the right breast. This was found to be concordant by Dr. [name]. Pathology results were discussed with the patient by telephone. The patient reported doing well after the biopsy. Post biopsy instructions and care were reviewed and questions were answered. The patient was encouraged to call [facility] for any additional concerns. Surgical consultation has been arranged with Dr. Erroll Luna at [facility], at the request of the patient, on August 10, 2016. Pathology results reported by Susa Raring RN, BSN on 08/07/2016. Electronically Signed   By: [name] III M.D   On: 08/07/2016 10:03   Result Date: 08/07/2016 CLINICAL DATA:  Biopsy for right breast distortion EXAM: ULTRASOUND GUIDED RIGHT BREAST CORE NEEDLE BIOPSY COMPARISON:  Previous exam(s). FINDINGS: I met with the patient and we discussed the procedure of ultrasound-guided biopsy, including benefits and alternatives. We discussed the high likelihood of a successful procedure. We discussed the risks of the procedure, including infection, bleeding, tissue injury, clip migration, and inadequate sampling. Informed written consent was given. The usual time-out protocol was performed immediately prior to the procedure. Using sterile technique and 1% Lidocaine as local anesthetic, under direct ultrasound visualization, a 12 gauge  spring-loaded device was used to perform biopsy of right breast distortion/mass using a lateral approach. At the conclusion of the procedure a ribbon shaped tissue marker clip was deployed into the biopsy cavity. Follow up 2 view mammogram was performed and dictated separately. IMPRESSION: Ultrasound guided biopsy of right breast distortion/mass. No apparent complications. Electronically Signed: By: [name] III M.D On: 08/06/2016 08:54       IMPRESSION:  The patient has a recent diagnosis of invasive mammary carcinoma of the right breast. She appears to be a good candidate for breast conservation treatment.  I discussed with the patient the role of adjuvant radiation treatment in this setting. We discussed the potential benefit of radiation treatment, especially with regards to local control of the patient's tumor. We also discussed the possible side effects and risks of such a treatment as well.  All of the patient's questions were answered. The patient wishes to proceed with radiation treatment at the appropriate time.  PLAN: I look forward to seeing the patient postoperatively to review her case and further discuss and coordinate an anticipated course of radiation treatment. At this time, I would anticipate treating the patient for 6-1/2 weeks using tangent fields to the right breast.       ________________________________   Jodelle Gross, MD, PhD   **Disclaimer: This note was dictated with voice recognition software. Similar sounding words can inadvertently be transcribed and this note may contain transcription errors which may not have been corrected upon publication of note.**    This document serves as a record of services personally performed by Kyung Rudd, MD. It was created on his behalf by Bethann Humble, a trained medical scribe. The creation of this record is based on the scribe's personal observations and the provider's statements to them. This document has been checked  and approved by the attending provider.

## 2016-08-25 ENCOUNTER — Other Ambulatory Visit: Payer: Self-pay | Admitting: Surgery

## 2016-08-25 DIAGNOSIS — C50411 Malignant neoplasm of upper-outer quadrant of right female breast: Secondary | ICD-10-CM

## 2016-08-26 ENCOUNTER — Ambulatory Visit
Admission: RE | Admit: 2016-08-26 | Discharge: 2016-08-26 | Disposition: A | Discharge status: Home / Self Care | Payer: 59 | Source: Ambulatory Visit | Attending: Surgery | Admitting: Surgery

## 2016-08-26 DIAGNOSIS — C50911 Malignant neoplasm of unspecified site of right female breast: Secondary | ICD-10-CM | POA: Diagnosis not present

## 2016-08-26 DIAGNOSIS — C50411 Malignant neoplasm of upper-outer quadrant of right female breast: Secondary | ICD-10-CM

## 2016-08-26 IMAGING — MG MM PLC BREAST LOC DEV 1ST LESION INC*R*
8 of 10 series · 8 of 10 positions shown · non-contrast
Comparison: Previous exam(s).

CLINICAL DATA: Patient with recently diagnosed right breast cancer
scheduled for lumpectomy requiring preoperative radioactive seed
localization.

EXAM:
MAMMOGRAPHIC GUIDED RADIOACTIVE SEED LOCALIZATION OF THE RIGHT
BREAST

[R CC (1 of 4)]
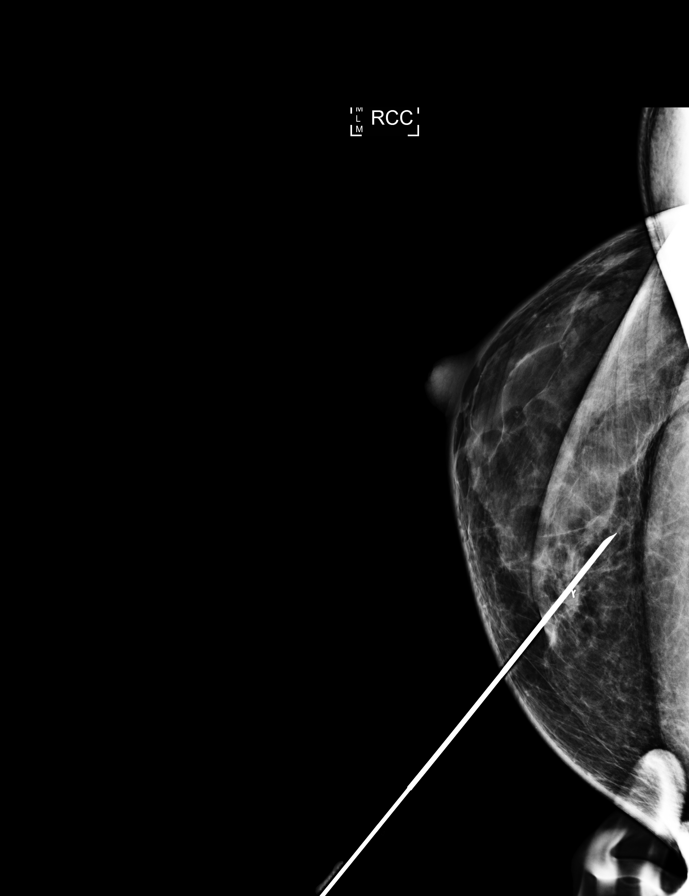

[R ML (1 of 4)]
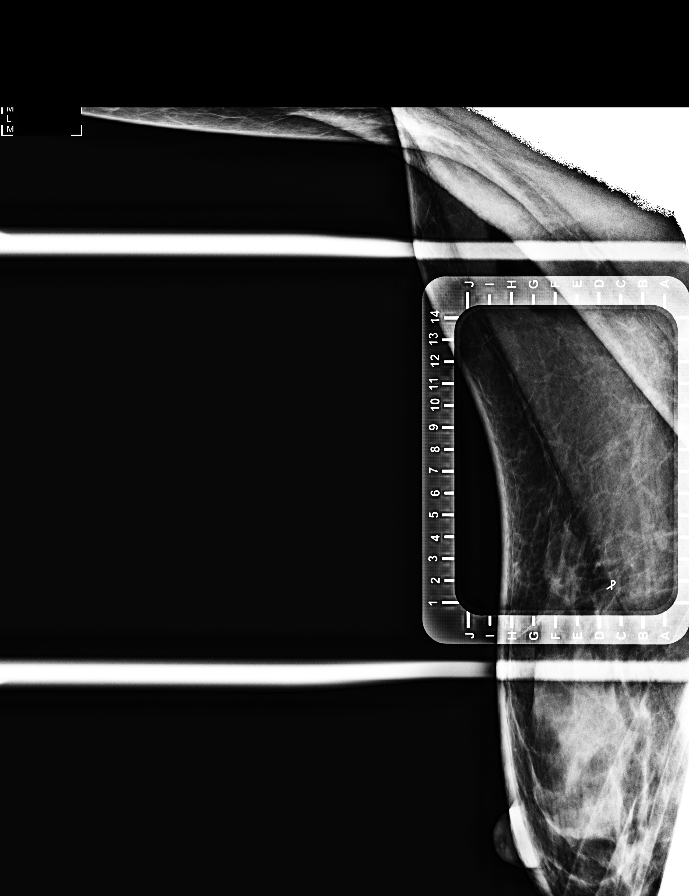

[R ML (2 of 4)]
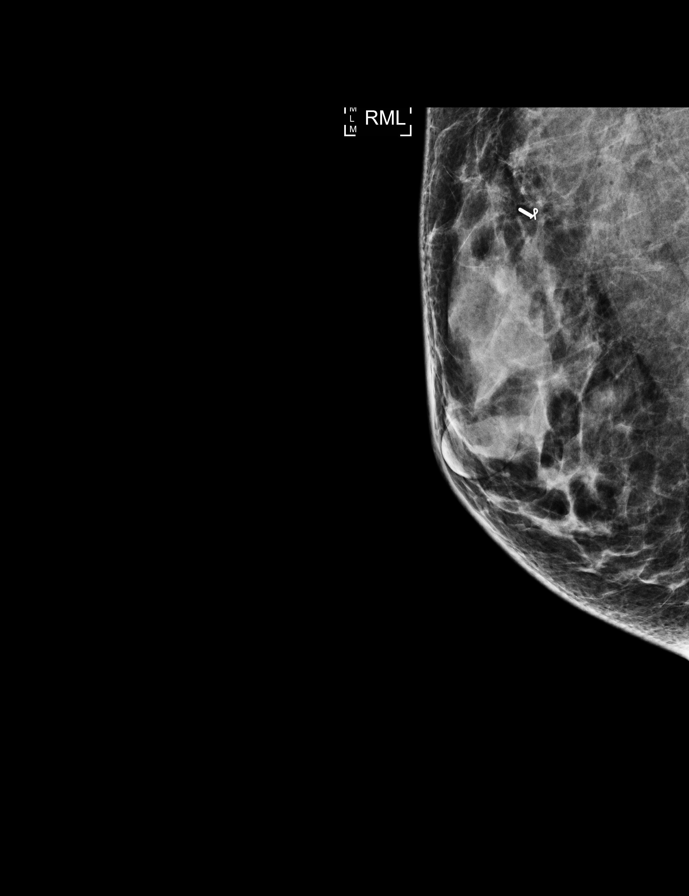

[R CC (2 of 4)]
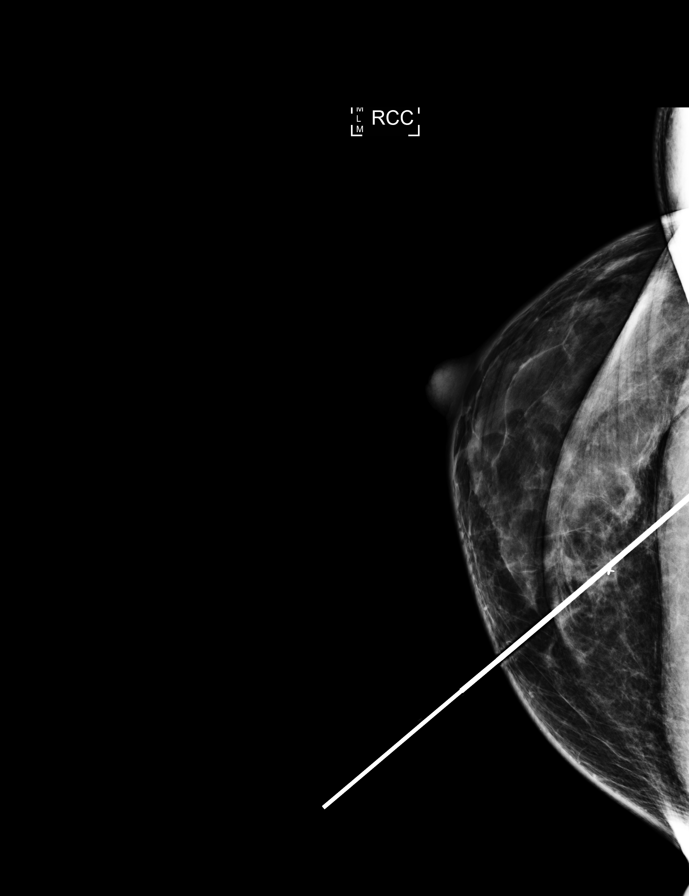

[R CC (3 of 4)]
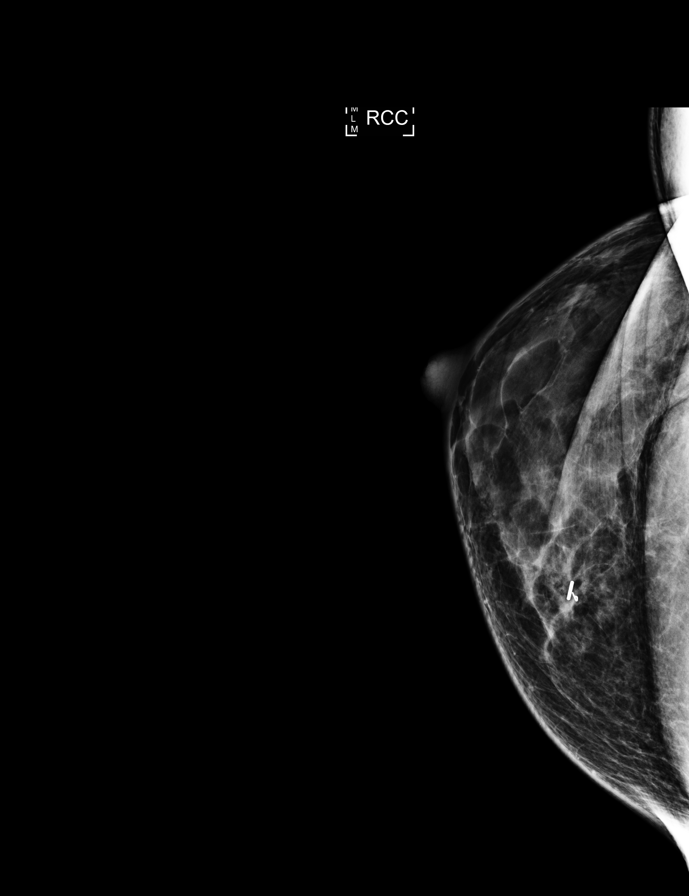

[R CC (4 of 4)]
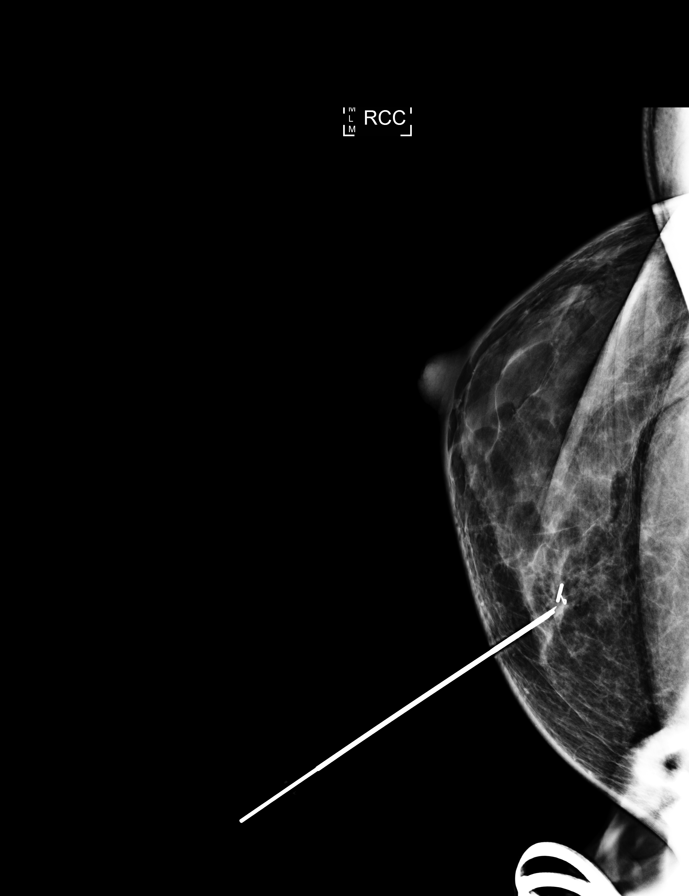

[R ML (3 of 4)]
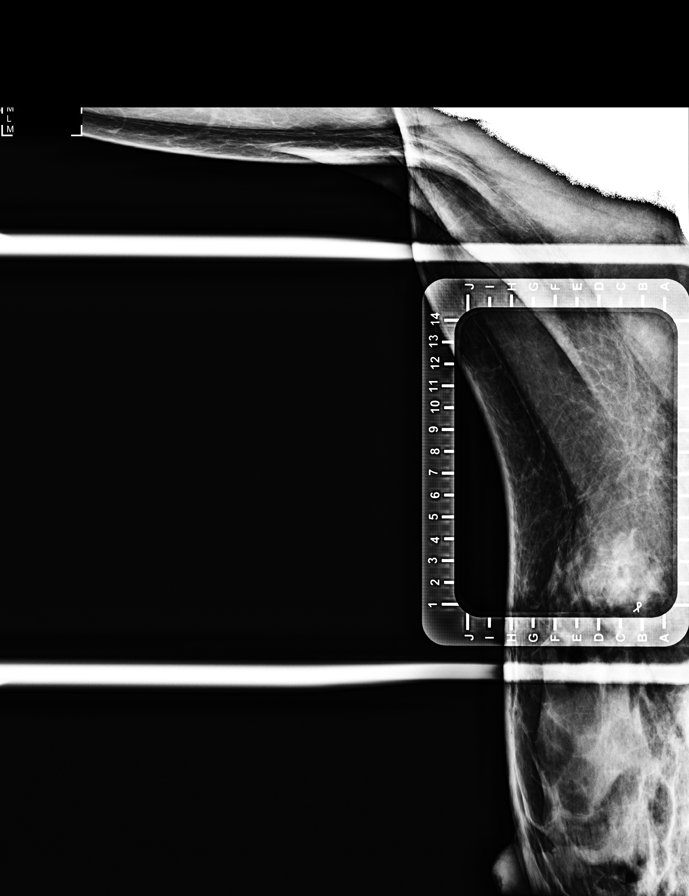

[R ML (4 of 4)]
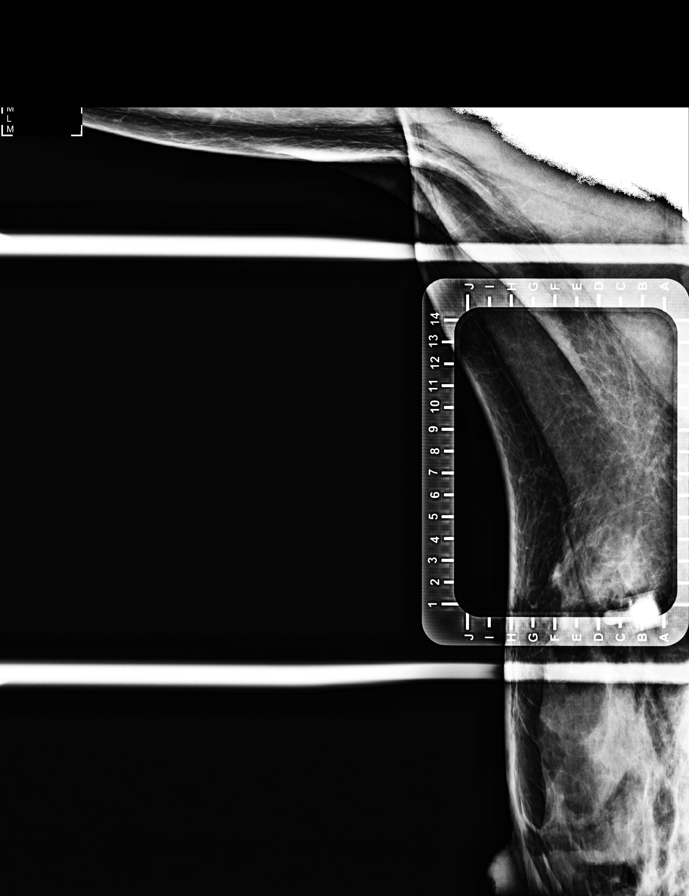

[8 of 10 positions shown; findings below may reference images not displayed]

FINDINGS: Patient presents for radioactive seed localization prior to breast
conservation surgery. I met with the patient and we discussed the
procedure of seed localization including benefits and alternatives.
We discussed the high likelihood of a successful procedure. We
discussed the risks of the procedure including infection, bleeding,
tissue injury and further surgery. We discussed the low dose of
radioactivity involved in the procedure. Informed, written consent
was given.

The usual time-out protocol was performed immediately prior to the
procedure.

Using mammographic guidance, sterile technique, 1% lidocaine and an
[CS] radioactive seed, ribbon shaped clip within the upper right
breast was localized using a medial approach. The follow-up
mammogram images confirm the seed in the expected location and were
marked for Dr. HOLUB.

Follow-up survey of the patient confirms presence of the radioactive
seed.

Order number of [CS] seed:  [PHONE_NUMBER].

Total activity:  0.247 millicuries  Reference Date: [DATE]

The patient tolerated the procedure well and was released from the
[REDACTED]. She was given instructions regarding seed removal.
IMPRESSION: Radioactive seed localization right breast. No apparent
complications.

## 2016-08-26 NOTE — Progress Notes (Signed)
Pt in to pick up [facility], instructions reviewed.

## 2016-08-27 ENCOUNTER — Encounter (HOSPITAL_BASED_OUTPATIENT_CLINIC_OR_DEPARTMENT_OTHER)
Admission: RE | Disposition: A | Discharge status: Home / Self Care | Payer: Self-pay | Source: Ambulatory Visit | Attending: Surgery

## 2016-08-27 ENCOUNTER — Ambulatory Visit
Admission: RE | Admit: 2016-08-27 | Discharge: 2016-08-27 | Disposition: A | Discharge status: Home / Self Care | Payer: 59 | Source: Ambulatory Visit | Attending: Surgery | Admitting: Surgery

## 2016-08-27 ENCOUNTER — Encounter (HOSPITAL_COMMUNITY)
Admission: RE | Admit: 2016-08-27 | Discharge: 2016-08-27 | Disposition: A | Discharge status: Home / Self Care | Payer: 59 | Source: Ambulatory Visit | Attending: Surgery | Admitting: Surgery

## 2016-08-27 ENCOUNTER — Ambulatory Visit (HOSPITAL_BASED_OUTPATIENT_CLINIC_OR_DEPARTMENT_OTHER)
Admission: RE | Admit: 2016-08-27 | Discharge: 2016-08-27 | Disposition: A | Discharge status: Home / Self Care | Payer: 59 | Source: Ambulatory Visit | Attending: Surgery | Admitting: Surgery

## 2016-08-27 ENCOUNTER — Encounter (HOSPITAL_BASED_OUTPATIENT_CLINIC_OR_DEPARTMENT_OTHER): Payer: Self-pay | Admitting: Anesthesiology

## 2016-08-27 ENCOUNTER — Ambulatory Visit: Payer: 59 | Admitting: Hematology

## 2016-08-27 ENCOUNTER — Ambulatory Visit (HOSPITAL_BASED_OUTPATIENT_CLINIC_OR_DEPARTMENT_OTHER): Payer: 59 | Admitting: Anesthesiology

## 2016-08-27 DIAGNOSIS — C50411 Malignant neoplasm of upper-outer quadrant of right female breast: Secondary | ICD-10-CM | POA: Diagnosis not present

## 2016-08-27 DIAGNOSIS — C50219 Malignant neoplasm of upper-inner quadrant of unspecified female breast: Secondary | ICD-10-CM | POA: Diagnosis not present

## 2016-08-27 DIAGNOSIS — C50211 Malignant neoplasm of upper-inner quadrant of right female breast: Secondary | ICD-10-CM | POA: Insufficient documentation

## 2016-08-27 DIAGNOSIS — G8918 Other acute postprocedural pain: Secondary | ICD-10-CM | POA: Diagnosis not present

## 2016-08-27 DIAGNOSIS — C50911 Malignant neoplasm of unspecified site of right female breast: Secondary | ICD-10-CM | POA: Diagnosis not present

## 2016-08-27 HISTORY — PX: BREAST LUMPECTOMY WITH RADIOACTIVE SEED AND SENTINEL LYMPH NODE BIOPSY: SHX6550

## 2016-08-27 HISTORY — DX: Dental restoration status: Z98.811

## 2016-08-27 HISTORY — DX: Malignant neoplasm of unspecified site of unspecified female breast: C50.919

## 2016-08-27 IMAGING — MG BREAST SURGICAL SPECIMEN
1 series · 1 of 1 positions shown · non-contrast
Comparison: Previous exam(s).

CLINICAL DATA: Right breast cancer status post surgical excision.

EXAM:
SPECIMEN RADIOGRAPH OF THE RIGHT BREAST

[R]
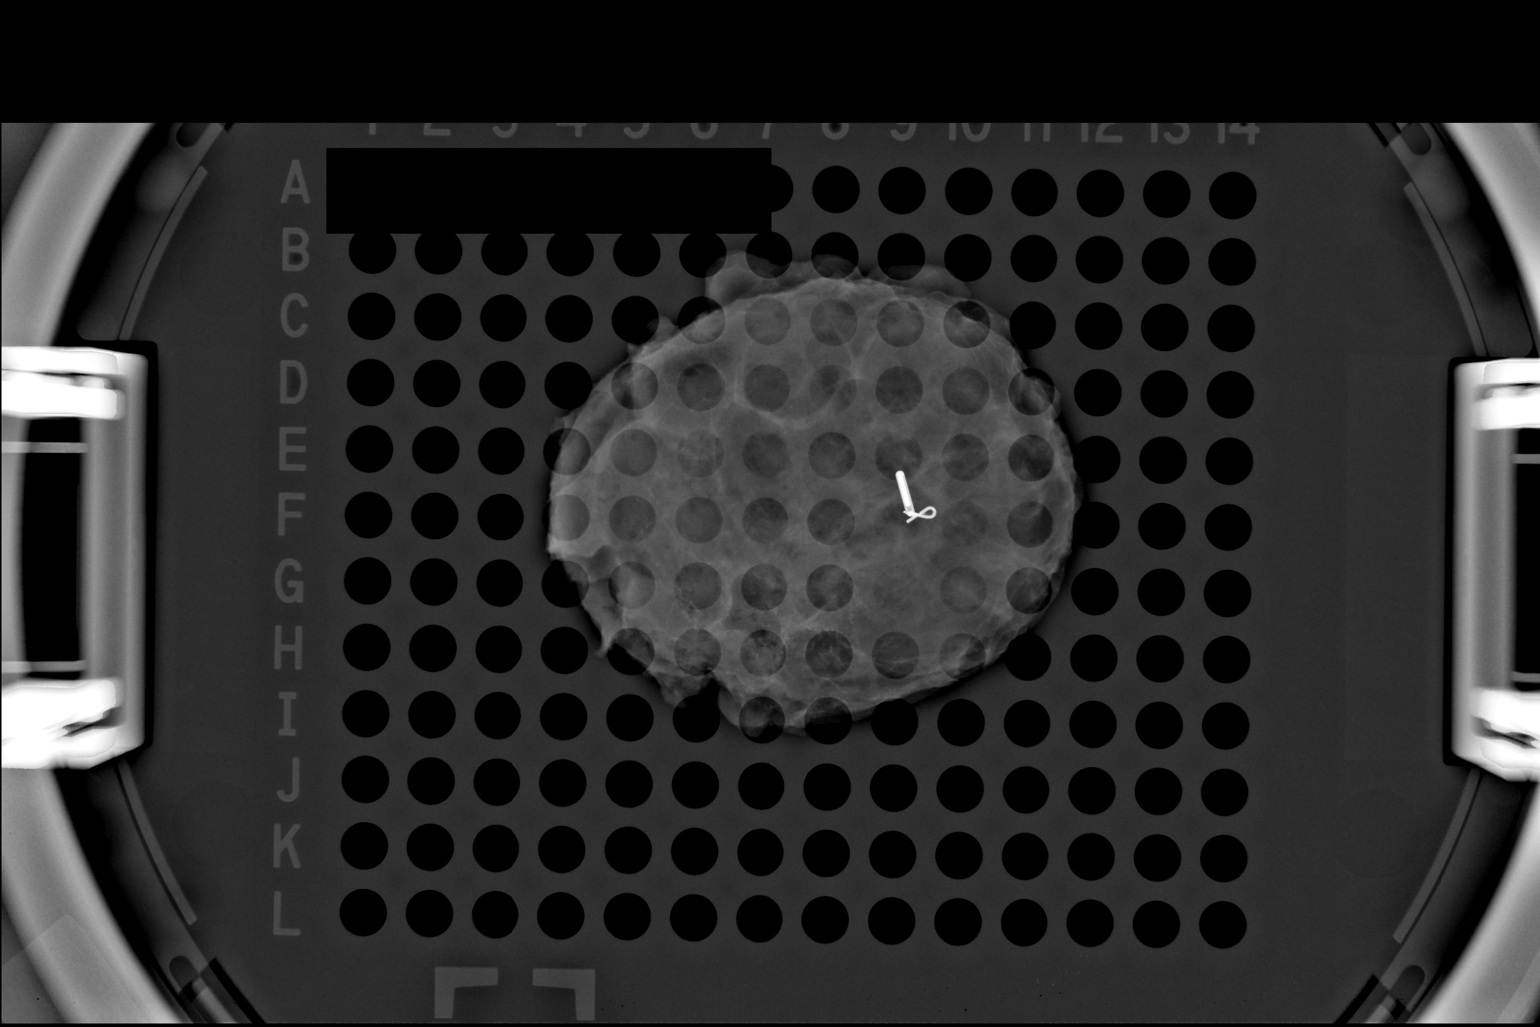

[1 of 1 positions shown; findings below may reference images not displayed]

FINDINGS: Status post excision of the right breast. The radioactive seed and
biopsy marker clip are present, completely intact, and were marked
for pathology.
IMPRESSION: Specimen radiograph of the right breast.

## 2016-08-27 SURGERY — BREAST LUMPECTOMY WITH RADIOACTIVE SEED AND SENTINEL LYMPH NODE BIOPSY
Anesthesia: General | Site: Breast | Laterality: Right

## 2016-08-27 MED ORDER — PROPOFOL 10 MG/ML IV BOLUS
INTRAVENOUS | Status: AC
  Filled 2016-08-27: qty 20

## 2016-08-27 MED ORDER — DEXTROSE 5 % IV SOLN
3.0000 g | INTRAVENOUS | Status: AC
  Administered 2016-08-27: 2 g via INTRAVENOUS

## 2016-08-27 MED ORDER — LIDOCAINE HCL (CARDIAC) 20 MG/ML IV SOLN
INTRAVENOUS | Status: DC | PRN
  Administered 2016-08-27: 30 mg via INTRAVENOUS

## 2016-08-27 MED ORDER — ONDANSETRON HCL 4 MG/2ML IJ SOLN: 4.0000 mg | Freq: Once | INTRAMUSCULAR | Status: DC | PRN

## 2016-08-27 MED ORDER — HEMOSTATIC AGENTS (NO CHARGE) OPTIME
TOPICAL | Status: DC | PRN
  Administered 2016-08-27: 1 via TOPICAL

## 2016-08-27 MED ORDER — FENTANYL CITRATE (PF) 100 MCG/2ML IJ SOLN
INTRAMUSCULAR | Status: AC
  Filled 2016-08-27: qty 2

## 2016-08-27 MED ORDER — LIDOCAINE 2% (20 MG/ML) 5 ML SYRINGE
INTRAMUSCULAR | Status: AC
  Filled 2016-08-27: qty 5

## 2016-08-27 MED ORDER — SCOPOLAMINE 1 MG/3DAYS TD PT72: 1.0000 | MEDICATED_PATCH | Freq: Once | TRANSDERMAL | Status: DC | PRN

## 2016-08-27 MED ORDER — CHLORHEXIDINE GLUCONATE CLOTH 2 % EX PADS: 6.0000 | MEDICATED_PAD | Freq: Once | CUTANEOUS | Status: DC

## 2016-08-27 MED ORDER — ACETAMINOPHEN 500 MG PO TABS
1000.0000 mg | ORAL_TABLET | ORAL | Status: AC
  Administered 2016-08-27: 1000 mg via ORAL

## 2016-08-27 MED ORDER — CELECOXIB 200 MG PO CAPS
ORAL_CAPSULE | ORAL | Status: AC
  Filled 2016-08-27: qty 2

## 2016-08-27 MED ORDER — MIDAZOLAM HCL 2 MG/2ML IJ SOLN
1.0000 mg | INTRAMUSCULAR | Status: DC | PRN
  Administered 2016-08-27: 1 mg via INTRAVENOUS
  Administered 2016-08-27: 2 mg via INTRAVENOUS

## 2016-08-27 MED ORDER — CEFAZOLIN SODIUM-DEXTROSE 2-4 GM/100ML-% IV SOLN
INTRAVENOUS | Status: AC
  Filled 2016-08-27: qty 100

## 2016-08-27 MED ORDER — BUPIVACAINE-EPINEPHRINE (PF) 0.5% -1:200000 IJ SOLN
INTRAMUSCULAR | Status: DC | PRN
  Administered 2016-08-27: 30 mL via PERINEURAL

## 2016-08-27 MED ORDER — DEXAMETHASONE SODIUM PHOSPHATE 10 MG/ML IJ SOLN
INTRAMUSCULAR | Status: AC
  Filled 2016-08-27: qty 1

## 2016-08-27 MED ORDER — DEXAMETHASONE SODIUM PHOSPHATE 4 MG/ML IJ SOLN
INTRAMUSCULAR | Status: DC | PRN
  Administered 2016-08-27: 10 mg via INTRAVENOUS

## 2016-08-27 MED ORDER — LACTATED RINGERS IV SOLN
INTRAVENOUS | Status: DC
  Administered 2016-08-27 (×2): via INTRAVENOUS

## 2016-08-27 MED ORDER — ONDANSETRON HCL 4 MG/2ML IJ SOLN
INTRAMUSCULAR | Status: DC | PRN
  Administered 2016-08-27: 4 mg via INTRAVENOUS

## 2016-08-27 MED ORDER — GABAPENTIN 300 MG PO CAPS
ORAL_CAPSULE | ORAL | Status: AC
  Filled 2016-08-27: qty 1

## 2016-08-27 MED ORDER — MEPERIDINE HCL 25 MG/ML IJ SOLN: 6.2500 mg | INTRAMUSCULAR | Status: DC | PRN

## 2016-08-27 MED ORDER — FENTANYL CITRATE (PF) 100 MCG/2ML IJ SOLN
50.0000 ug | INTRAMUSCULAR | Status: DC | PRN
  Administered 2016-08-27: 50 ug via INTRAVENOUS
  Administered 2016-08-27: 100 ug via INTRAVENOUS

## 2016-08-27 MED ORDER — HYDROMORPHONE HCL 1 MG/ML IJ SOLN
INTRAMUSCULAR | Status: AC
  Filled 2016-08-27: qty 1

## 2016-08-27 MED ORDER — MIDAZOLAM HCL 2 MG/2ML IJ SOLN
INTRAMUSCULAR | Status: AC
  Filled 2016-08-27: qty 2

## 2016-08-27 MED ORDER — PROPOFOL 10 MG/ML IV BOLUS
INTRAVENOUS | Status: DC | PRN
  Administered 2016-08-27: 200 mg via INTRAVENOUS

## 2016-08-27 MED ORDER — 0.9 % SODIUM CHLORIDE (POUR BTL) OPTIME
TOPICAL | Status: DC | PRN
  Administered 2016-08-27: 1000 mL

## 2016-08-27 MED ORDER — GABAPENTIN 300 MG PO CAPS
300.0000 mg | ORAL_CAPSULE | ORAL | Status: AC
  Administered 2016-08-27: 300 mg via ORAL

## 2016-08-27 MED ORDER — BUPIVACAINE HCL (PF) 0.25 % IJ SOLN
INTRAMUSCULAR | Status: DC | PRN
  Administered 2016-08-27: 11 mL

## 2016-08-27 MED ORDER — PHENYLEPHRINE HCL 10 MG/ML IJ SOLN
INTRAMUSCULAR | Status: DC | PRN
  Administered 2016-08-27: 80 ug via INTRAVENOUS

## 2016-08-27 MED ORDER — CELECOXIB 400 MG PO CAPS
400.0000 mg | ORAL_CAPSULE | ORAL | Status: AC
  Administered 2016-08-27: 400 mg via ORAL

## 2016-08-27 MED ORDER — TECHNETIUM TC 99M SULFUR COLLOID FILTERED
1.0000 | Freq: Once | INTRAVENOUS | Status: AC | PRN
  Administered 2016-08-27: 1 via INTRADERMAL

## 2016-08-27 MED ORDER — HYDROMORPHONE HCL 1 MG/ML IJ SOLN
0.2500 mg | INTRAMUSCULAR | Status: DC | PRN
  Administered 2016-08-27: 0.25 mg via INTRAVENOUS

## 2016-08-27 MED ORDER — HYDROCODONE-ACETAMINOPHEN 5-325 MG PO TABS: 1.0000 | ORAL_TABLET | Freq: Four times a day (QID) | ORAL | 0 refills | Status: DC | PRN

## 2016-08-27 MED ORDER — ONDANSETRON HCL 4 MG/2ML IJ SOLN
INTRAMUSCULAR | Status: AC
  Filled 2016-08-27: qty 2

## 2016-08-27 MED ORDER — FENTANYL CITRATE (PF) 100 MCG/2ML IJ SOLN
INTRAMUSCULAR | Status: DC | PRN
  Administered 2016-08-27: 100 ug via INTRAVENOUS
  Administered 2016-08-27: 25 ug via INTRAVENOUS

## 2016-08-27 MED ORDER — MIDAZOLAM HCL 5 MG/5ML IJ SOLN
INTRAMUSCULAR | Status: DC | PRN
  Administered 2016-08-27: 2 mg via INTRAVENOUS

## 2016-08-27 MED ORDER — ACETAMINOPHEN 500 MG PO TABS
ORAL_TABLET | ORAL | Status: AC
  Filled 2016-08-27: qty 2

## 2016-08-27 SURGICAL SUPPLY — 39 items
APPLIER CLIP 9.375 MED OPEN (MISCELLANEOUS) ×2
BINDER BREAST MEDIUM (GAUZE/BANDAGES/DRESSINGS) ×2 IMPLANT
BLADE SURG 15 STRL LF DISP TIS (BLADE) ×1 IMPLANT
BLADE SURG 15 STRL SS (BLADE) ×1
CANISTER SUC SOCK COL 7IN (MISCELLANEOUS) ×2 IMPLANT
CANISTER SUCT 1200ML W/VALVE (MISCELLANEOUS) ×2 IMPLANT
CHLORAPREP W/TINT 26ML (MISCELLANEOUS) ×2 IMPLANT
CLIP APPLIE 9.375 MED OPEN (MISCELLANEOUS) ×1 IMPLANT
COVER BACK TABLE 60X90IN (DRAPES) ×2 IMPLANT
COVER MAYO STAND STRL (DRAPES) ×2 IMPLANT
COVER PROBE W GEL 5X96 (DRAPES) ×2 IMPLANT
DERMABOND ADVANCED (GAUZE/BANDAGES/DRESSINGS) ×1
DERMABOND ADVANCED .7 DNX12 (GAUZE/BANDAGES/DRESSINGS) ×1 IMPLANT
DEVICE DUBIN W/COMP PLATE 8390 (MISCELLANEOUS) ×2 IMPLANT
DRAPE LAPAROSCOPIC ABDOMINAL (DRAPES) ×2 IMPLANT
DRAPE UTILITY XL STRL (DRAPES) ×2 IMPLANT
ELECT COATED BLADE 2.86 ST (ELECTRODE) ×2 IMPLANT
ELECT REM PT RETURN 9FT ADLT (ELECTROSURGICAL) ×2
ELECTRODE REM PT RTRN 9FT ADLT (ELECTROSURGICAL) ×1 IMPLANT
GLOVE BIOGEL PI IND STRL 8 (GLOVE) ×1 IMPLANT
GLOVE BIOGEL PI INDICATOR 8 (GLOVE) ×1
GLOVE ECLIPSE 8.0 STRL XLNG CF (GLOVE) ×2 IMPLANT
GOWN STRL REUS W/ TWL LRG LVL3 (GOWN DISPOSABLE) ×2 IMPLANT
GOWN STRL REUS W/TWL LRG LVL3 (GOWN DISPOSABLE) ×2
HEMOSTAT SNOW SURGICEL 2X4 (HEMOSTASIS) ×2 IMPLANT
KIT MARKER MARGIN INK (KITS) ×2 IMPLANT
NEEDLE HYPO 25X1 1.5 SAFETY (NEEDLE) ×2 IMPLANT
NS IRRIG 1000ML POUR BTL (IV SOLUTION) ×2 IMPLANT
PACK BASIN DAY SURGERY FS (CUSTOM PROCEDURE TRAY) ×2 IMPLANT
PENCIL BUTTON HOLSTER BLD 10FT (ELECTRODE) ×2 IMPLANT
SLEEVE SCD COMPRESS KNEE MED (MISCELLANEOUS) ×2 IMPLANT
SPONGE LAP 4X18 X RAY DECT (DISPOSABLE) ×2 IMPLANT
SUT MNCRL AB 4-0 PS2 18 (SUTURE) ×2 IMPLANT
SUT VICRYL 3-0 CR8 SH (SUTURE) ×2 IMPLANT
SYR CONTROL 10ML LL (SYRINGE) ×2 IMPLANT
TOWEL OR 17X24 6PK STRL BLUE (TOWEL DISPOSABLE) ×2 IMPLANT
TOWEL OR NON WOVEN STRL DISP B (DISPOSABLE) ×2 IMPLANT
TUBE CONNECTING 20X1/4 (TUBING) ×2 IMPLANT
YANKAUER SUCT BULB TIP NO VENT (SUCTIONS) ×2 IMPLANT

## 2016-08-27 NOTE — Transfer of Care (Signed)
Immediate Anesthesia Transfer of Care Note  Patient: [name]  Procedure(s) Performed: Procedure(s): RIGHT BREAST LUMPECTOMY WITH RADIOACTIVE SEED AND SENTINEL LYMPH NODE BIOPSY (Right)  Patient Location: PACU  Anesthesia Type:GA combined with regional for post-op pain  Level of Consciousness: drowsy  Airway & Oxygen Therapy: Patient Spontanous Breathing and Patient connected to face mask oxygen  Post-op Assessment: Report given to RN and Post -op Vital signs reviewed and stable  Post vital signs: Reviewed and stable  Last Vitals:  Vitals:   08/27/16 1401 08/27/16 1415  BP: 120/68 121/71  Pulse: 61 (!) 56  Resp: (!) 9 13  Temp: 36.4 C     Last Pain:  Vitals:   08/27/16 1435  PainSc: 0-No pain         Complications: No apparent anesthesia complications

## 2016-08-27 NOTE — Op Note (Addendum)
Preoperative diagnosis: Stage I right breast cancer upper inner quadrant  Postoperative diagnosis: Same  Procedure: Right breast seed localized partial mastectomy with right axillary sentinel deep lymph node mapping and biopsy  Surgeon: Erroll Luna M.D.  Anesthesia: LMA with regional block and local anesthetic  EBL: 10 mL  Specimen: Right breast mass with eating clip specimen with additional the margin and 3 right axillary sentinel nodes  Drains: None  Indications for procedure: The patient is a 43 female diagnosed with stage I right breast cancer. She options of breast conservation versus mastectomy with or without reconstruction were discussed. Additional treatments to include potentially radiation and chemotherapy were discussed. She opted for breast conservation.The procedure has been discussed with the patient. Alternatives to surgery have been discussed with the patient.  Risks of surgery include bleeding,  Infection,  Seroma formation, death,  and the need for further surgery.   The patient understands and wishes to proceed.Sentinel lymph node mapping and dissection has been discussed with the patient.  Risk of bleeding,  Infection,  Seroma formation,  Additional procedures,,  Shoulder weakness ,  Shoulder stiffness,  Nerve and blood vessel injury and reaction to the mapping dyes have been discussed.  Alternatives to surgery have been discussed with the patient.  The patient agrees to proceed.      Description of procedure: The patient was met in the holding area. The right breast was marked as the correct side. Regional block was placed by anesthesia. She underwent injection of technetium sulfur colloid the right breast for mapping. She was taken back to the operating room and placed upon the OR table. Right breast is prepped and draped in sterile fashion after general anesthesia was initiated. Neoprobe was used in the mass was localized in the upper right breast inner quadrant.  Transverse incision made over this dissection was carried down all tissue around the seating clip were excised. Margins were grossly negative. Given the radiograph, the medial margin look closer therefore took additional tissue. Hemostasis achieved. Clips placed to mark the cavity. Irrigation used and hemostat has stasis again achieved. Wound closed with 3-0 Vicryl for Monocryl.  Probed then switched to settings for sentinel lymph node mapping. Neoprobe was used and hot spot identified in right axilla. 4 cm incision made in the right axilla along the inferior hairline. Dissection carried down to the deepest compartment. There are 3 hot nodes identified and removed. Background counts approached 0. These were all level I lymph nodes. The long thoracic nerve was visualized and preserved. The thoracodorsal trunk and axillary vein were also visualized. Irrigation used. Surgicel snow placed. Hemostasis achieved with cautery and Surgicel. Wound closed with 3-0 Vicryl and 4-0 Monocryl. Liquid he's applied to both. Breast binder placed. All final counts are found to be correct. Patient was in awoke and taken recovery in satisfactory condition extubated.  The [place] narcotic database has been quieried and no conflicts identified.

## 2016-08-27 NOTE — Anesthesia Procedure Notes (Signed)
Procedure Name: LMA Insertion Date/Time: 08/27/2016 12:44 PM Performed by: Toula Moos L Pre-anesthesia Checklist: Patient identified, Emergency Drugs available, Suction available, Patient being monitored and Timeout performed Patient Re-evaluated:Patient Re-evaluated prior to inductionOxygen Delivery Method: Circle system utilized Preoxygenation: Pre-oxygenation with 100% oxygen Intubation Type: IV induction Ventilation: Mask ventilation without difficulty LMA: LMA inserted LMA Size: 4.0 Number of attempts: 1 Airway Equipment and Method: Bite block Placement Confirmation: positive ETCO2 Tube secured with: Tape Dental Injury: Teeth and Oropharynx as per pre-operative assessment

## 2016-08-27 NOTE — Transfer of Care (Signed)
Immediate Anesthesia Transfer of Care Note  Patient: [name]  Procedure(s) Performed: Procedure(s): RIGHT BREAST LUMPECTOMY WITH RADIOACTIVE SEED AND SENTINEL LYMPH NODE BIOPSY (Right)  Patient Location: PACU  Anesthesia Type:GA combined with regional for post-op pain  Level of Consciousness: drowsy  Airway & Oxygen Therapy: Patient Spontanous Breathing and Patient connected to face mask oxygen  Post-op Assessment: Report given to RN and Post -op Vital signs reviewed and stable  Post vital signs: Reviewed and stable  Last Vitals:  Vitals:   08/27/16 1220 08/27/16 1225  BP:    Pulse: 70 70  Resp: 16 18  Temp:      Last Pain: There were no vitals filed for this visit.       Complications: No apparent anesthesia complications

## 2016-08-27 NOTE — Progress Notes (Signed)
  Assisted Dr. Conrad Marietta with right, ultrasound guided, pectoralis block. Side rails up, monitors on throughout procedure. See vital signs in flow sheet. Tolerated Procedure well. Nuc Med injection done immediately following, pt tolerated well.

## 2016-08-27 NOTE — Anesthesia Procedure Notes (Addendum)
Anesthesia Regional Block: Pectoralis block   Pre-Anesthetic Checklist: ,, timeout performed, Correct Patient, Correct Site, Correct Laterality, Correct Procedure, Correct Position, site marked, Risks and benefits discussed,  Surgical consent,  Pre-op evaluation,  At surgeon's request and post-op pain management  Laterality: Right  Prep: chloraprep       Needles:  Injection technique: Single-shot     Needle Length: 9cm  Needle Gauge: 21     Additional Needles:   Procedures: ultrasound guided,,,,,,,,  Narrative:  Start time: 08/27/2016 11:57 AM End time: 08/27/2016 12:07 PM Injection made incrementally with aspirations every 5 mL.  Events: injection painful,,,,,,,,,,  Performed by: Personally  Anesthesiologist: Lillia Abed  Additional Notes: Monitors applied. Patient sedated. Sterile prep and drape,hand hygiene and sterile gloves were used. Relevant anatomy identified.Needle position confirmed.Local anesthetic injected incrementally after negative aspiration. Local anesthetic spread visualized. Vascular puncture avoided. No complications. Image printed for medical record.The patient tolerated the procedure well.

## 2016-08-27 NOTE — Discharge Instructions (Signed)
°Post Anesthesia Home Care Instructions ° °Activity: °Get plenty of rest for the remainder of the day. A responsible individual must stay with you for 24 hours following the procedure.  °For the next 24 hours, DO NOT: °-Drive a car °-Operate machinery °-Drink alcoholic beverages °-Take any medication unless instructed by your physician °-Make any legal decisions or sign important papers. ° °Meals: °Start with liquid foods such as gelatin or soup. Progress to regular foods as tolerated. Avoid greasy, spicy, heavy foods. If nausea and/or vomiting occur, drink only clear liquids until the nausea and/or vomiting subsides. Call your physician if vomiting continues. ° °Special Instructions/Symptoms: °Your throat may feel dry or sore from the anesthesia or the breathing tube placed in your throat during surgery. If this causes discomfort, gargle with warm salt water. The discomfort should disappear within 24 hours. ° °If you had a scopolamine patch placed behind your ear for the management of post- operative nausea and/or vomiting: ° °1. The medication in the patch is effective for 72 hours, after which it should be removed.  Wrap patch in a tissue and discard in the trash. Wash hands thoroughly with soap and water. °2. You may remove the patch earlier than 72 hours if you experience unpleasant side effects which may include dry mouth, dizziness or visual disturbances. °3. Avoid touching the patch. Wash your hands with soap and water after contact with the patch. °  ° ° ° ° °[facility] °Office Phone Number [phone] ° °BREAST BIOPSY/ PARTIAL MASTECTOMY: POST OP INSTRUCTIONS ° °Always review your discharge instruction sheet given to you by the facility where your surgery was performed. ° °IF YOU HAVE DISABILITY OR FAMILY LEAVE FORMS, YOU MUST BRING THEM TO THE OFFICE FOR PROCESSING.  DO NOT GIVE THEM TO YOUR DOCTOR. ° °1. A prescription for pain medication may be given to you upon discharge.  Take your  pain medication as prescribed, if needed.  If narcotic pain medicine is not needed, then you may take acetaminophen (Tylenol) or ibuprofen (Advil) as needed. °2. Take your usually prescribed medications unless otherwise directed °3. If you need a refill on your pain medication, please contact your pharmacy.  They will contact our office to request authorization.  Prescriptions will not be filled after 5pm or on week-ends. °4. You should eat very light the first 24 hours after surgery, such as soup, crackers, pudding, etc.  Resume your normal diet the day after surgery. °5. Most patients will experience some swelling and bruising in the breast.  Ice packs and a good support bra will help.  Swelling and bruising can take several days to resolve.  °6. It is common to experience some constipation if taking pain medication after surgery.  Increasing fluid intake and taking a stool softener will usually help or prevent this problem from occurring.  A mild laxative (Milk of Magnesia or Miralax) should be taken according to package directions if there are no bowel movements after 48 hours. °7. Unless discharge instructions indicate otherwise, you may remove your bandages 24-48 hours after surgery, and you may shower at that time.  You may have steri-strips (small skin tapes) in place directly over the incision.  These strips should be left on the skin for 7-10 days.  If your surgeon used skin glue on the incision, you may shower in 24 hours.  The glue will flake off over the next 2-3 weeks.  Any sutures or staples will be removed at the office during your follow-up visit. °  8. ACTIVITIES:  You may resume regular daily activities (gradually increasing) beginning the next day.  Wearing a good support bra or sports bra minimizes pain and swelling.  You may have sexual intercourse when it is comfortable. °a. You may drive when you no longer are taking prescription pain medication, you can comfortably wear a seatbelt, and you can  safely maneuver your car and apply brakes. °b. RETURN TO WORK:  ______________________________________________________________________________________ °9. You should see your doctor in the office for a follow-up appointment approximately two weeks after your surgery.  Your doctor’s nurse will typically make your follow-up appointment when she calls you with your pathology report.  Expect your pathology report 2-3 business days after your surgery.  You may call to check if you do not hear from us after three days. °10. OTHER INSTRUCTIONS: _______________________________________________________________________________________________ _____________________________________________________________________________________________________________________________________ °_____________________________________________________________________________________________________________________________________ °_____________________________________________________________________________________________________________________________________ ° °WHEN TO CALL YOUR DOCTOR: °1. Fever over 101.0 °2. Nausea and/or vomiting. °3. Extreme swelling or bruising. °4. Continued bleeding from incision. °5. Increased pain, redness, or drainage from the incision. ° °The clinic staff is available to answer your questions during regular business hours.  Please don’t hesitate to call and ask to speak to one of the nurses for clinical concerns.  If you have a medical emergency, go to the nearest emergency room or call 911.  A surgeon from [facility] is always on call at the hospital. ° °For further questions, please visit [email or web address]  °

## 2016-08-27 NOTE — Anesthesia Postprocedure Evaluation (Signed)
Anesthesia Post Note  Patient: [name]  Procedure(s) Performed: Procedure(s) (LRB): RIGHT BREAST LUMPECTOMY WITH RADIOACTIVE SEED AND SENTINEL LYMPH NODE BIOPSY (Right)  Patient location during evaluation: PACU Anesthesia Type: General Level of consciousness: patient remains intubated per anesthesia plan Pain management: pain level controlled Vital Signs Assessment: post-procedure vital signs reviewed and stable Respiratory status: spontaneous breathing, nonlabored ventilation, respiratory function stable and patient connected to nasal cannula oxygen Cardiovascular status: blood pressure returned to baseline and stable Postop Assessment: no signs of nausea or vomiting Anesthetic complications: no       Last Vitals:  Vitals:   08/27/16 1527 08/27/16 1537  BP: (!) 157/95   Pulse:  (!) 56  Resp:  18  Temp:  36.8 C    Last Pain:  Vitals:   08/27/16 1537  TempSrc: Oral  PainSc: 2                  Elwyn Lowden DAVID

## 2016-08-27 NOTE — Interval H&P Note (Signed)
History and Physical Interval Note:  08/27/2016 12:19 PM  [name]  has presented today for surgery, with the diagnosis of RIGHT BREAST CANCER  The various methods of treatment have been discussed with the patient and family. After consideration of risks, benefits and other options for treatment, the patient has consented to  Procedure(s): RIGHT BREAST LUMPECTOMY WITH RADIOACTIVE SEED AND SENTINEL LYMPH NODE BIOPSY (Right) as a surgical intervention .  The patient's history has been reviewed, patient examined, no change in status, stable for surgery.  I have reviewed the patient's chart and labs.  Questions were answered to the patient's satisfaction.     Billie Intriago A.

## 2016-08-27 NOTE — Anesthesia Preprocedure Evaluation (Signed)
Anesthesia Evaluation  Patient identified by MRN, date of birth, ID band Patient awake    Reviewed: Allergy & Precautions, NPO status , Patient's Chart, lab work & pertinent test results  Airway Mallampati: I  TM Distance: >3 FB Neck ROM: Full    Dental   Pulmonary    Pulmonary exam normal        Cardiovascular Normal cardiovascular exam     Neuro/Psych    GI/Hepatic   Endo/Other    Renal/GU      Musculoskeletal   Abdominal   Peds  Hematology   Anesthesia Other Findings   Reproductive/Obstetrics                             Anesthesia Physical Anesthesia Plan  ASA: II  Anesthesia Plan: General   Post-op Pain Management:  Regional for Post-op pain   Induction: Intravenous  Airway Management Planned: LMA  Additional Equipment:   Intra-op Plan:   Post-operative Plan: Extubation in OR  Informed Consent: I have reviewed the patients History and Physical, chart, labs and discussed the procedure including the risks, benefits and alternatives for the proposed anesthesia with the patient or authorized representative who has indicated his/her understanding and acceptance.     Plan Discussed with: CRNA and Surgeon  Anesthesia Plan Comments:         Anesthesia Quick Evaluation

## 2016-08-27 NOTE — H&P (View-Only) (Signed)
[name] 08/10/2016 9:10 AM Location: [facility] Patient #: [id number] DOB: 04/01/1971 Divorced / Language: English / Race: Refused to Report/Unreported Female  History of Present Illness (Camil Hausmann A. Lanna Labella MD; 08/10/2016 10:46 AM) Patient words: Patient sent at the request of Dr. David Williams for an abnormal mammogram. She underwent mammography this month had an area of distortion in her right breast upper outer quadrant. The area measured about 7 millimeters. 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 Patient denies history of breast lump, breast pain, nipple discharge or any change in her appearance or examination of either breast. There is no family history of breast cancer or other malignancy. She is healthy and exercises daily and drinks alcohol in a social setting. Denies tobacco use.                     ADDITIONAL INFORMATION: E-cadherin is negative supporting a lobular phenotype. JULIA MANNY MD Pathologist, Electronic Signature ( Signed 08/07/2016) FINAL DIAGNOSIS Diagnosis Breast, right, needle core biopsy, 12:30 o'clock - INVASIVE MAMMARY CARCINOMA, SEE COMMENT. - LOBULAR NEOPLASIA (ATYPICAL LOBULAR HYPERPLASIA). Microscopic Comment The carcinoma appears grade 2. E-cadherin will be ordered and reported in an addendum. Prognostic markers will be ordered. Dr. Kish has reviewed the case. The case was called to [facility] on 08/07/2016. JULIA MANNY MD Pathologist, Electronic Signature (Case signed 08/07/2016) Specimen Gross and Clinical Information Specimen Comment In formalin 8:20 am extracted less than 1 min; distortion/mass Specimen(s) Obtained: Breast, right, needle core biopsy, 12:30 o'clock Specimen Clinical Information Distortion/mass 1 of 2 FINAL for [name], [name] ([id number]) Gross The specimen  is received in formalin labeled with the patient's name and right breast 12:30, and consists of four cores of tan yellow fibroadipose tissue, ranging from 0.8 x 0.2 x 0.1 cm to 1.3 x 0.2 x 0.2 cm. The specimen is entirely submitted in one cassette. Time in formalin 8:20 a.m. on 08/06/16. CIT is less than 1 minute. (KL:kh 08-06-16) Stain(s) used in Diagnosis: The following stain(s) were used in diagnosing the case: E-CAD. The control(s) stained appropriately. Disclaimer Estrogen receptor (6F11), immunohistochemical stains are performed on formalin fixed, paraffin embedded tissue using a 3,3"-diaminobenzidine (DAB) chromogen and Leica Bond Autostainer System. The staining intensity of the nucleus is scored manually and is reported as the percentage of tumor cell nuclei demonstrating specific nuclear staining. Ki-67 (MM1), immunohistochemical stains are performed on formalin fixed, paraffin embedded tissue using a 3,3"-diaminobenzidine (DAB) chromogen and Leica Bond Autostainer System. The staining intensity of the nucleus is scored manually and is reported as the percentage of tumor cell nuclei demonstrating specific nuclear staining. HER2 IQFISH pharmDX (code K5731) is a direct fluorescence in-situ hybridization assay designed to quantitatively determine HER2 gene amplification in formalin-fixed, paraffin-embedded tissue specimens. It is performed at [facility] and is reported using ASCO/CAP scoring criteria published in 2013. Some of these immunohistochemical stains may have been developed and the performance characteristics determined by [facility]. Some may not have been cleared or approved by the U.S. Food and Drug Administration. The FDA has determined that such clearance or approval is not necessary. This test is used for clinical purposes. It should not be regarded as investigational or for research. This laboratory is certified under the Clinical Laboratory Improvement  Amendments of 1988 (CLIA-88) as qualified to perform high complexity clinical laboratory testing. PR progesterone receptor (16), immunohistochemical stains are performed on formalin fixed,   paraffin embedded tissue using a 3,3"-diaminobenzidine (DAB) chromogen and [place]. The staining intensity of the nucleus is scored manually and is reported as the percentage of tumor cell nuclei demonstrating specific nuclear staining. Report signed out from the following location(s) Technical Component was performed at [facility]. 75                 CLINICAL DATA: Callback from screening mammogram for possible right breast distortion  EXAM: 2D DIGITAL DIAGNOSTIC RIGHT MAMMOGRAM WITH CAD AND ADJUNCT TOMO  ULTRASOUND RIGHT BREAST  COMPARISON: Previous exam(s).  ACR Breast Density Category c: The breast tissue is heterogeneously dense, which may obscure small masses.  FINDINGS: MLO spot-compression and full lateral views of the right breast were performed with tomosynthesis. On the additional views, there is a small persistent area of distortion within the superior right breast. This appears to be within the slightly medial right breast, posterior depth on the CC view of the screening mammogram.  Mammographic images were processed with CAD.  Targeted ultrasound of the right breast was performed demonstrating an irregular hypoechoic mass at 12:30, 5 cm from the nipple measuring 6 x 4 x 7 mm which is thought to correspond to the area of distortion seen mammographically. Targeted ultrasound of the right axilla demonstrates no suspicious appearing axillary lymph nodes.  IMPRESSION: Indeterminate right breast mass at 12:30, 5 cm from the nipple.  RECOMMENDATION: Ultrasound-guided right breast biopsy.  I have discussed the findings and recommendations with the patient. Results were also provided in writing at the conclusion of the visit. If  applicable, a reminder letter will be sent to the patient regarding the next appointment.  BI-RADS CATEGORY 4: Suspicious.   Electronically Signed By: [name]. On: 08/05/2016 14:11.  The patient is a 46 year old female.   Diagnostic Studies History [name], [place]; 08/10/2016 9:10 AM) Colonoscopy never Mammogram within last year Pap Smear 1-5 years ago  Allergies [name], RMA; 08/10/2016 9:12 AM) Azithromycin *CHEMICALS* Vomiting.  Medication History [name], RMA; 08/10/2016 9:12 AM) Lo Loestrin Fe (1 MG-10 MCG /10 MCG Tablet, Oral) Active. Medications Reconciled  Social History [name], [place]; 08/10/2016 9:10 AM) Alcohol use Moderate alcohol use. Caffeine use Carbonated beverages, Coffee, Tea. No drug use Tobacco use Never smoker.  Family History [name], [place]; 08/10/2016 9:10 AM) Cerebrovascular Accident Father. Diabetes Mellitus Father. Kidney Disease Father.  Pregnancy / Birth History [name], [place]; 08/10/2016 9:10 AM) Age at menarche 22 years. Age of menopause 57-50 Contraceptive History Oral contraceptives. Gravida 2 Irregular periods Maternal age 36-40 Para 0  Other Problems [name], [place]; 08/10/2016 9:10 AM) Breast Cancer     Review of Systems [name] RMA; 08/10/2016 9:10 AM) General Not Present- Appetite Loss, Chills, Fatigue, Fever, Night Sweats, Weight Gain and Weight Loss. Skin Not Present- Change in Wart/Mole, Dryness, Hives, Jaundice, New Lesions, Non-Healing Wounds, Rash and Ulcer. HEENT Present- Seasonal Allergies. Not Present- Earache, Hearing Loss, Hoarseness, Nose Bleed, Oral Ulcers, Ringing in the Ears, Sinus Pain, Sore Throat, Visual Disturbances, Wears glasses/contact lenses and Yellow Eyes. Respiratory Not Present- Bloody sputum, Chronic Cough, Difficulty Breathing, Snoring and Wheezing. Breast Present- Breast Mass. Not Present- Breast Pain,  Nipple Discharge and Skin Changes. Cardiovascular Not Present- Chest Pain, Difficulty Breathing Lying Down, Leg Cramps, Palpitations, Rapid Heart Rate, Shortness of Breath and Swelling of Extremities. Gastrointestinal Not Present- Abdominal Pain, Bloating, Bloody Stool, Change in Bowel Habits, Chronic diarrhea, Constipation, Difficulty Swallowing, Excessive gas, Gets full quickly  at meals, Hemorrhoids, Indigestion, Nausea, Rectal Pain and Vomiting. Female Genitourinary Not Present- Frequency, Nocturia, Painful Urination, Pelvic Pain and Urgency. Musculoskeletal Not Present- Back Pain, Joint Pain, Joint Stiffness, Muscle Pain, Muscle Weakness and Swelling of Extremities. Neurological Not Present- Decreased Memory, Fainting, Headaches, Numbness, Seizures, Tingling, Tremor, Trouble walking and Weakness. Psychiatric Not Present- Anxiety, Bipolar, Change in Sleep Pattern, Depression, Fearful and Frequent crying. Endocrine Not Present- Cold Intolerance, Excessive Hunger, Hair Changes, Heat Intolerance, Hot flashes and New Diabetes. Hematology Not Present- Blood Thinners, Easy Bruising, Excessive bleeding, Gland problems, HIV and Persistent Infections.  Vitals [name] RMA; 08/10/2016 9:12 AM) 08/10/2016 9:12 AM Weight: 143.6 lb Height: 67in Body Surface Area: 1.76 m Body Mass Index: 22.49 kg/m  Temp.: 98.43F  Pulse: 85 (Regular)  BP: 130/80 (Sitting, Left Arm, Standard)      Physical Exam (Chike Farrington A. Jadden Yim MD; 08/10/2016 10:46 AM)  General Mental Status-Alert. General Appearance-Consistent with stated age. Hydration-Well hydrated. Voice-Normal.  Head and Neck Head-normocephalic, atraumatic with no lesions or palpable masses. Trachea-midline. Thyroid Gland Characteristics - normal size and consistency.  Eye Eyeball - Bilateral-Extraocular movements intact. Sclera/Conjunctiva - Bilateral-No scleral icterus.  Chest and Lung Exam Chest and lung exam  reveals -quiet, even and easy respiratory effort with no use of accessory muscles and on auscultation, normal breath sounds, no adventitious sounds and normal vocal resonance. Inspection Chest Wall - Normal. Back - normal.  Breast Breast - Left-Symmetric, Non Tender, No Biopsy scars, no Dimpling, No Inflammation, No Lumpectomy scars, No Mastectomy scars, No Peau Pope' Orange. Breast - Right-Symmetric, Non Tender, No Biopsy scars, no Dimpling, No Inflammation, No Lumpectomy scars, No Mastectomy scars, No Peau Pope' Orange. Breast Lump-No Palpable Breast Mass.  Cardiovascular Cardiovascular examination reveals -normal heart sounds, regular rate and rhythm with no murmurs and normal pedal pulses bilaterally.  Musculoskeletal Normal Exam - Left-Upper Extremity Strength Normal and Lower Extremity Strength Normal. Normal Exam - Right-Upper Extremity Strength Normal and Lower Extremity Strength Normal.  Lymphatic Head & Neck  General Head & Neck Lymphatics: Bilateral - Description - Normal. Axillary  General Axillary Region: Bilateral - Description - Normal. Tenderness - Non Tender.    Assessment & Plan (Plato Alspaugh A. Quorra Rosene MD; 08/10/2016 10:47 AM)  BREAST CANCER, RIGHT (C50.911) Impression: Discussed treatment options of breast conservation versus mastectomy. The pros and cons of long-term expectations of each type of surgery were discussed. Potential complications of each. Discussed breast reconstruction. She is opted for right breast needle localized partial mastectomy with right axillary sentinel lymph node mapping. Risk of lumpectomy include bleeding, infection, seroma, more surgery, use of seed/wire, wound care, cosmetic deformity and the need for other treatments, death , blood clots, death. Pt agrees to proceed. Risk of sentinel lymph node mapping include bleeding, infection, lymphedema, shoulder pain. stiffness, dye allergy. cosmetic deformity , blood clots, death, need for more  surgery. Pt agres to proceed.    Referral to medical and radiation oncology  She will need a breast MRI due to her lobular subtype.  Not sure she is a candidate for genetic counseling given no family history  Current Plans You are being scheduled for surgery- Our schedulers will call you.  You should hear from our office's scheduling department within 5 working days about the location, date, and time of surgery. We try to make accommodations for patient's preferences in scheduling surgery, but sometimes the OR schedule or the surgeon's schedule prevents [place] from making those accommodations.  If you have not heard from our office [phone])  in 5 working days, call the office and ask for your surgeon's nurse.  If you have other questions about your diagnosis, plan, or surgery, call the office and ask for your surgeon's nurse.  Pt Education - [facility] Breast Cancer Information Given - [employer] "Breast Journey" Package We discussed the staging and pathophysiology of breast cancer. We discussed all of the different options for treatment for breast cancer including surgery, chemotherapy, radiation therapy, Herceptin, and antiestrogen therapy. We discussed a sentinel lymph node biopsy as she does not appear to having lymph node involvement right now. We discussed the performance of that with injection of radioactive tracer and blue dye. We discussed that she would have an incision underneath her axillary hairline. We discussed that there is a bout a 10-20% chance of having a positive node with a sentinel lymph node biopsy and we will await the permanent pathology to make any other first further decisions in terms of her treatment. One of these options might be to return to the operating room to perform an axillary lymph node dissection. We discussed about a 1-2% risk lifetime of chronic shoulder pain as well as lymphedema associated with a sentinel lymph node biopsy. We discussed the options for  treatment of the breast cancer which included lumpectomy versus a mastectomy. We discussed the performance of the lumpectomy with a wire placement. We discussed a 10-20% chance of a positive margin requiring reexcision in the operating room. We also discussed that she may need radiation therapy or antiestrogen therapy or both if she undergoes lumpectomy. We discussed the mastectomy and the postoperative care for that as well. We discussed that there is no difference in her survival whether she undergoes lumpectomy with radiation therapy or antiestrogen therapy versus a mastectomy. There is a slight difference in the local recurrence rate being 3-5% with lumpectomy and about 1% with a mastectomy. We discussed the risks of operation including bleeding, infection, possible reoperation. She understands her further therapy will be based on what her stages at the time of her operation.  Pt Education - [facility] breast cancer surgery: discussed with patient and provided information. Pt Education - ABC (After Breast Cancer) Class Info: discussed with patient and provided information.

## 2016-08-28 ENCOUNTER — Encounter (HOSPITAL_BASED_OUTPATIENT_CLINIC_OR_DEPARTMENT_OTHER): Payer: Self-pay | Admitting: Surgery

## 2016-09-08 NOTE — Progress Notes (Signed)
[place]  [phone] Fax:([phone]) [phone]  Clinic New Consult Note   Patient Care Team: Jonathon Jordan, MD as PCP - General (Family Medicine) Erroll Luna, MD as Consulting Physician (General Surgery) Kyung Rudd, MD as Consulting Physician (Radiation Oncology) Truitt Merle, MD as Consulting Physician (Hematology) 09/10/2016  Referring physician: Dr. Brantley Stage  CHIEF COMPLAINTS/PURPOSE OF CONSULTATION:  Consultation of recently diagnosed Stage [id number] Right Breast Invasive Lobular Carcinoma, ER/PR Positive, Her2 Negative, Grade I; status post right lumpectomy  Oncology History   Cancer Staging Malignant neoplasm of upper-outer quadrant of right breast in female, estrogen receptor positive ([place]) Staging form: Breast, AJCC 8th Edition - Pathologic stage from 08/27/2016: Stage IA (pT1a(m), pN0, cM0, G1, ER: Positive, PR: Positive, HER2: Negative) - Signed by Truitt Merle, MD on 09/09/2016 - Pathologic: No stage assigned - Unsigned       Malignant neoplasm of upper-outer quadrant of right breast in female, estrogen receptor positive ([place])   08/05/2016 Mammogram    Targeted ultrasound of the right breast was performed demonstrating an irregular hypoechoic mass at 12:30, 5 cm from the nipple measuring 6 x 4 x 7 mm which is thought to correspond to the area of distortion seen mammographically. Targeted ultrasound of the right axilla demonstrates no suspicious appearing axillary lymph nodes.      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      08/24/2016 Initial Diagnosis    Malignant neoplasm of upper-outer quadrant of right breast in female, estrogen receptor positive ([place])     08/27/2016  Surgery    Right lumpectomy and SLN biopsy, Dr. Brantley Stage       08/27/2016 Pathology Results    Right lumpectomy showed [place] and LCIS, G1, final margins are negative, all 6 nodes are negative         HISTORY OF PRESENTING ILLNESS (09/10/16):  [name] 46 y.o. female is here because of a recent diagnosis of right breast cancer.  She presents to the clinic today and was referred by Dr. Brantley Stage. This was discovered by Screening mammogram. No abnormal feelings during the finding. She has been yearly to get mammogram. Patient feels the surgery went well. She reports a mild seroma that was drained earlier this week by Dr. Brantley Stage. She is having nerve pain on right arm. She is almost able to raise right arm all the way. Considering Physical Therapy for arm. Stopped Vicodin for pain and instead was using 3 Advil 3 gtimes a day. Have not taken Xanax since biopsy "only as emergency".   She reports she stopped taking birth control and she went Perimenopausal. She started taking Soy estrogen to alleviate  symptoms. She stopped taking this 3 weeks ago and so far no symptoms of Perimenopausal coming back. She feels she is handling things well.   Today 4/26 her BP was high but she reports it is normally in range at home. No history of cancer in family but has history of other chronic diseases. Has a job that is stressful but manages it well. She exercises several times a week. PCP Is Dr. Alessandra Grout. The patient reports she has not seen  her PCP recently for routine lab work. Has a gynecologist Dr. Julien Girt.   GYN HISTORY  Menarchal: Not reported LMP: a few years ago  Contraceptive: stopped and went perimenopausal HRT: No GP: G2P0, She had one miscarriage and one abortion due to no fetal heart beat.    MEDICAL HISTORY:  Past Medical History:  Diagnosis Date  . Breast cancer ([place]) 08/2016   right  . Dental crown present     SURGICAL HISTORY: Past Surgical History:  Procedure Laterality Date    . BREAST LUMPECTOMY WITH RADIOACTIVE SEED AND SENTINEL LYMPH NODE BIOPSY Right 08/27/2016   Procedure: RIGHT BREAST LUMPECTOMY WITH RADIOACTIVE SEED AND SENTINEL LYMPH NODE BIOPSY;  Surgeon: Erroll Luna, MD;  Location: [facility];  Service: General;  Laterality: Right;  . DILATION AND EVACUATION  04/08/2008; 08/01/2008    SOCIAL HISTORY: Social History   Social History  . Marital status: Divorced    Spouse name: N/A  . Number of children: N/A  . Years of education: N/A   Occupational History  . Not on file.   Social History Main Topics  . Smoking status: Never Smoker  . Smokeless tobacco: Never Used  . Alcohol use 0.6 - 1.2 oz/week    1 - 2 Glasses of wine per week     Comment: 3 x/week  . Drug use: No  . Sexual activity: Not on file   Other Topics Concern  . Not on file   Social History Narrative  . No narrative on file    FAMILY HISTORY: Family History  Problem Relation Age of Onset  . Diabetes Mellitus II Father     ALLERGIES:  is allergic to erythromycin.  MEDICATIONS:  Current Outpatient Prescriptions  Medication Sig Dispense Refill  . ALPRAZolam (XANAX) 0.25 MG tablet Take 0.25 mg by mouth at bedtime as needed for anxiety.    . fexofenadine (ALLEGRA) 180 MG tablet Take 180 mg by mouth daily.    Marland Kitchen FIBER ADULT GUMMIES PO Take by mouth daily.     Marland Kitchen ibuprofen (ADVIL,MOTRIN) 200 MG tablet Take 300 mg by mouth 3 (three) times daily.    . Melatonin 1 MG TABS Take 1 tablet by mouth daily as needed.    . Multiple Vitamin (MULTIVITAMIN) tablet Take 1 tablet by mouth daily.    . vitamin C (ASCORBIC ACID) 500 MG tablet Take 500 mg by mouth daily.    Marland Kitchen HYDROcodone-acetaminophen (NORCO/VICODIN) 5-325 MG tablet Take 1-2 tablets by mouth every 6 (six) hours as needed for moderate pain. (Patient not taking: Reported on 09/10/2016) 20 tablet 0   No current facility-administered medications for this visit.     REVIEW OF SYSTEMS:  Constitutional: Denies  fevers, chills or abnormal night sweats (+) swelling Eyes: Denies blurriness of vision, double vision or watery eyes Ears, nose, mouth, throat, and face: Denies mucositis or sore throat Respiratory: Denies cough, dyspnea or wheezes Cardiovascular: Denies palpitation, chest discomfort or lower extremity swelling Gastrointestinal:  Denies nausea, heartburn or change in bowel habits Skin: Denies abnormal skin rashes Lymphatics: Denies new lymphadenopathy or easy bruising Neurological:Denies numbness, tingling or new weaknesses Behavioral/Psych: Mood is stable, no new changes  Musculosketaton: (+) Upper Right arm pain  All other systems were reviewed with the patient and are negative.  PHYSICAL EXAMINATION:  ECOG PERFORMANCE STATUS: 0 - Asymptomatic  Vitals:   09/10/16 1138  BP: (!) 175/82  Pulse: 96  Resp: 17  Temp: 98.3 F (36.8 C)   Filed Weights  09/10/16 1138  Weight: 142 lb 3.2 oz (64.5 kg)    GENERAL:alert, no distress and comfortable SKIN: skin color, texture, turgor are normal, no rashes or significant lesions EYES: normal, conjunctiva are pink and non-injected, sclera clear OROPHARYNX:no exudate, no erythema and lips, buccal mucosa, and tongue normal  NECK: supple, thyroid normal size, non-tender, without nodularity LYMPH:  no palpable lymphadenopathy in the cervical, axillary or inguinal LUNGS: clear to auscultation and percussion with normal breathing effort HEART: regular rate & rhythm and no murmurs and no lower extremity edema ABDOMEN:abdomen soft, non-tender and normal bowel sounds Musculoskeletal:no cyanosis of digits and no clubbing  PSYCH: alert & oriented x 3 with fluent speech NEURO: no focal motor/sensory deficits Breast: surgical excision in right upper mid-breast is healed well with no erythema. In right axilla there is a 1.0 cm scar/seroma, slightly tender with no drainage.   LABORATORY DATA:  I have reviewed the data as listed CBC Latest Ref Rng &  Units 08/01/2008 04/08/2008  WBC 4.0 - 10.5 K/uL 8.5 12.7(H)  Hemoglobin 12.0 - 15.0 g/dL 14.2 12.0  Hematocrit 36.0 - 46.0 % 41.5 33.8(L)  Platelets 150 - 400 K/uL 212 223    Surgical Pathology  08/27/16 1. Breast, lumpectomy, Right - INVASIVE LOBULAR CARCINOMA, GRADE I/III, TWO FOCI SPANNING 0.5 CM AND A SEPARATE MICROSCOPIC FOCUS. - LOBULAR CARCINOMA IN SITU. - THE SURGICAL RESECTION MARGINS ARE NEGATIVE FOR INVASIVE CARCINOMA. - SEE ONCOLOGY TABLE BELOW. 2. Breast, excision, Right new medial margin - BENIGN BREAST PARENCHYMA. - THERE IS NO EVIDENCE OF MALIGNANCY. - SEE COMMENT. 3. Lymph node, sentinel, biopsy, Right axillary - THERE IS NO EVIDENCE OF CARCINOMA IN 1 OF 1 LYMPH NODE (0/1). - SEE COMMENT. 4. Lymph node, sentinel, biopsy, Right axillary - THERE IS NO EVIDENCE OF CARCINOMA IN 1 OF 1 LYMPH NODE (0/1). - SEE COMMENT. 5. Lymph node, sentinel, biopsy, Right axillary - THERE IS NO EVIDENCE OF CARCINOMA IN 1 OF 1 LYMPH NODE (0/1). - SEE COMMENT. 6. Lymph node, sentinel, biopsy, Right axillary - THERE IS NO EVIDENCE OF CARCINOMA IN 1 OF 1 LYMPH NODE (0/1). - SEE COMMENT. 7. Lymph node, sentinel, biopsy, Right axillary - THERE IS NO EVIDENCE OF CARCINOMA IN 1 OF 1 LYMPH NODE (0/1). - SEE COMMENT. 8. Lymph node, sentinel, biopsy, Right axillary - THERE IS NO EVIDENCE OF CARCINOMA IN 1 OF 1 LYMPH NODE (0/1). - SEE COMMENT.  08/06/16 Breast, right, needle core biopsy, 12:30 o'clock - INVASIVE MAMMARY CARCINOMA, SEE COMMENT. - LOBULAR NEOPLASIA (ATYPICAL LOBULAR HYPERPLASIA).  RADIOGRAPHIC STUDIES: I have personally reviewed the radiological images as listed and agreed with the findings in the report. Mr Breast Bilateral W Wo Contrast  Result Date: 08/19/2016 CLINICAL DATA:  Biopsy proven right breast invasive mammary carcinoma and atypical lobular carcinoma. LABS:  None obtained at the time of imaging. EXAM: BILATERAL BREAST MRI WITH AND WITHOUT CONTRAST TECHNIQUE:  Multiplanar, multisequence MR images of both breasts were obtained prior to and following the intravenous administration of 12 ml of MultiHance. THREE-DIMENSIONAL MR IMAGE RENDERING ON INDEPENDENT WORKSTATION: Three-dimensional MR images were rendered by post-processing of the original MR data on an independent workstation. The three-dimensional MR images were interpreted, and findings are reported in the following complete MRI report for this study. Three dimensional images were evaluated at the independent DynaCad workstation COMPARISON:  Previous exam(s). FINDINGS: Breast composition: d. Extreme fibroglandular tissue. Background parenchymal enhancement: Moderate. Right breast: There is a 7 x 6 x 7 mm enhancing mass in  the posterior third of the upper inner quadrant of the right breast. There is a signal void artifact in the mass from the biopsy clip. Left breast: No mass or abnormal enhancement. Lymph nodes: No abnormal appearing lymph nodes. Ancillary findings:  None. IMPRESSION: Solitary enhancing mass in the upper-inner quadrant of the right breast corresponding with the recently biopsied invasive mammary carcinoma. RECOMMENDATION: Treatment planning of the biopsy proven right breast invasive mammary carcinoma is recommended. BI-RADS CATEGORY  6: Known biopsy-proven malignancy. Electronically Signed   By: Lillia Mountain M.D.   On: 08/19/2016 09:38   Mm Breast Surgical Specimen  Result Date: 08/27/2016 CLINICAL DATA:  Right breast cancer status post surgical excision. EXAM: SPECIMEN RADIOGRAPH OF THE RIGHT BREAST COMPARISON:  Previous exam(s). FINDINGS: Status post excision of the right breast. The radioactive seed and biopsy marker clip are present, completely intact, and were marked for pathology. IMPRESSION: Specimen radiograph of the right breast. Electronically Signed   By: Abelardo Diesel M.D.   On: 08/27/2016 13:23   Mm Rt Radioactive Seed Loc Mammo Guide  Result Date: 08/26/2016 CLINICAL DATA:   Patient with recently diagnosed right breast cancer scheduled for lumpectomy requiring preoperative radioactive seed localization. EXAM: MAMMOGRAPHIC GUIDED RADIOACTIVE SEED LOCALIZATION OF THE RIGHT BREAST COMPARISON:  Previous exam(s). FINDINGS: Patient presents for radioactive seed localization prior to breast conservation surgery. I met with the patient and we discussed the procedure of seed localization including benefits and alternatives. We discussed the high likelihood of a successful procedure. We discussed the risks of the procedure including infection, bleeding, tissue injury and further surgery. We discussed the low dose of radioactivity involved in the procedure. Informed, written consent was given. The usual time-out protocol was performed immediately prior to the procedure. Using mammographic guidance, sterile technique, 1% lidocaine and an I-125 radioactive seed, ribbon shaped clip within the upper right breast was localized using a medial approach. The follow-up mammogram images confirm the seed in the expected location and were marked for Dr. Brantley Stage. Follow-up survey of the patient confirms presence of the radioactive seed. Order number of I-125 seed:  [id number]. Total activity:  9.977 millicuries  Reference Date: 07/16/2016 The patient tolerated the procedure well and was released from the [facility]. She was given instructions regarding seed removal. IMPRESSION: Radioactive seed localization right breast. No apparent complications. Electronically Signed   By: Franki Cabot M.D.   On: 08/26/2016 14:17    ASSESSMENT & PLAN: [name] is a 46 y.o. female with a recent diagnosis of Stage cT1bN0M0 invasive lobular carcinoma of the UOQ of right breast, ER/PR positive, Her2 negative, Ki67 10%, Grade I.    1. Malignant [place] of upper-outer quadrant of right breast, stage IA (pT1bN0M0), Invasive Lobular Carcinoma, ER/PR Positive, Her2 Negative, Grade I  -Her neoplasm was discovered  initially by routine screening mammography on 08/05/16. - We reviewed the surgical pathology from 08/27/16, as well as staging and biology of the tumor. Pathology revealed ER/PR positive, Her2 negative, Ki67 10%. Surgical margins were negative. 6 lymph nodes were all negative.  -We discussed the risk of cancer recurrence after her complete surgical resection. Given the very early stage disease, and low-grade tumor, her risk of recurrence is likely low. - In light of the patient's very early stage and low grade, she does not require adjuvant chemo treatment. I do not think she needs Oncotype Dx test. ---Giving her strongly ER and PR positivity of the tumor cells, I recommend adjuvant endocrine therapy with tamoxifen or  AI to reduce her risk of recurrence, especially distant recurrence. The potential side effects, which includes but not limited to, hot flash, skin and vaginal dryness, slightly increased risk of cardiovascular disease and cataract, muscular stiffness and arthralgia, small risk of thrombosis and endometrial cancer from Tamoxifen, were discussed with her in great details. Preventive strategies for thrombosis, such as being physically active, using compression stocks, avoid cigarette smoking, etc., were reviewed with her. She voiced good understanding, and agrees to proceed. Will start after she completes adjuvant breast radiation. -She has not had a menstrual period for use since she has been on oral contraceptives, which she stopped recently. She is likely still premenopausal given her age. I'll check her [place] and estradiol level to determine her menopausal status. -We also reviewed the breast cancer surveillance, including annual mammogram, and physical exam.  -She will proceed with breast irradiation next.   2. Upper Right Arm Pain associated with Surgery  -I suggest alternating use of Tylenol/Motrin to alleviate her discomfort. - She is not using prescribed Vicodin at this time, and reports  the pain is not severe enough to warrant it. - I will discuss PT with Dr. Brantley Stage; I believe this will alleviate some of the patient's discomfort.  PLAN: -Will run labs today 09/10/16 to test for menopausal state - I will copy Dr. Lisbeth Renshaw on my note today. I will also copy Dr. Brantley Stage to ask about referring the patient to PT. -Will start Tamoxifen/AI after radiation therapy. -will see her back after she completes adjuvant radiation   No orders of the defined types were placed in this encounter.   This document serves as a record of services personally performed by Truitt Merle, MD. It was created on her behalf by Maryla Morrow, a trained medical scribe. The creation of this record is based on the scribe's personal observations and the provider's statements to them. This document has been checked and approved by the attending provider.      Truitt Merle, MD 09/10/2016

## 2016-09-10 ENCOUNTER — Encounter: Payer: Self-pay | Admitting: Hematology

## 2016-09-10 ENCOUNTER — Encounter: Payer: Self-pay | Admitting: *Deleted

## 2016-09-10 ENCOUNTER — Ambulatory Visit (HOSPITAL_BASED_OUTPATIENT_CLINIC_OR_DEPARTMENT_OTHER): Payer: 59 | Admitting: Hematology

## 2016-09-10 VITALS — BP 175/82 | HR 96 | Temp 98.3°F | Resp 17 | Ht 67.0 in | Wt 142.2 lb

## 2016-09-10 DIAGNOSIS — Z170 Estrogen receptor positive status [ER+]: Secondary | ICD-10-CM

## 2016-09-10 DIAGNOSIS — C50411 Malignant neoplasm of upper-outer quadrant of right female breast: Secondary | ICD-10-CM | POA: Diagnosis not present

## 2016-09-10 DIAGNOSIS — M79621 Pain in right upper arm: Secondary | ICD-10-CM

## 2016-09-11 ENCOUNTER — Encounter: Payer: Self-pay | Admitting: Hematology

## 2016-09-12 ENCOUNTER — Encounter: Payer: Self-pay | Admitting: Hematology

## 2016-09-15 ENCOUNTER — Encounter: Payer: Self-pay | Admitting: Radiation Oncology

## 2016-09-21 NOTE — Progress Notes (Addendum)
Follow up New Consult Right Breast  Diagnosis : 08/27/2016: Dr. Erroll Luna, MD  And follow u p on 10/05/16 1. Breast, lumpectomy, Right - INVASIVE LOBULAR CARCINOMA, GRADE I/III, TWO FOCI SPANNING 0.5 CM AND A SEPARATE MICROSCOPIC FOCUS. - LOBULAR CARCINOMA IN SITU. - THE SURGICAL RESECTION MARGINS ARE NEGATIVE FOR INVASIVE CARCINOMA. - SEE ONCOLOGY TABLE BELOW. 2. Breast, excision, Right new medial margin - BENIGN BREAST PARENCHYMA. - THERE IS NO EVIDENCE OF MALIGNANCY. - SEE COMMENT. 3. Lymph node, sentinel, biopsy, Right axillary - THERE IS NO EVIDENCE OF CARCINOMA IN 1 OF 1 LYMPH NODE (0/1). - SEE COMMENT. 4. Lymph node, sentinel, biopsy, Right axillary - THERE IS NO EVIDENCE OF CARCINOMA IN 1 OF 1 LYMPH NODE (0/1). - SEE COMMENT. 5. Lymph node, sentinel, biopsy, Right axillary - THERE IS NO EVIDENCE OF CARCINOMA IN 1 OF 1 LYMPH NODE (0/1). - SEE COMMENT. 6. Lymph node, sentinel, biopsy, Right axillary - THERE IS NO EVIDENCE OF CARCINOMA IN 1 OF 1 LYMPH NODE (0/1). - SEE COMMENT. 7. Lymph node, sentinel, biopsy, Right axillary - THERE IS NO EVIDENCE OF CARCINOMA IN 1 OF1 LYMPH NODE (0/1). 8. Lymph node, sentinel, biopsy, Right axillary - THERE IS NO EVIDENCE OF CARCINOMA IN 1 OF 1 LYMPH NODE (0/1). ER100%+,PR=90%+ HER 2 neg, Ki67=10%  Saw Dr. Burr Medico 09/10/16:No chemotherapy Seroma drained 2 weeks ago Saw Dr. Marcello Moores surgeon yesterday for rash and redness under axilla and breast,  ,started Saturday, thought to be allergic reaction, patient was at the beach  This weekend past, new pods for washing clothes only new thing added Pain and soreness on breast and axilla 7:48 AM BP 111/85 (BP Location: Left Arm, Patient Position: Sitting, Cuff Size: Normal)   Pulse 67   Temp 97.9 F (36.6 C) (Oral)   Resp 18   Ht _0  (1.702 m)   Wt 144 lb (65.3 kg)   SpO2 100%   BMI 22.55 kg/m   Wt Readings from Last 3 Encounters:  09/22/16 144 lb (65.3 kg)  09/10/16 142 lb 3.2 oz (64.5 kg)   08/27/16 140 lb (63.5 kg)

## 2016-09-22 ENCOUNTER — Encounter: Payer: Self-pay | Admitting: Radiation Oncology

## 2016-09-22 ENCOUNTER — Ambulatory Visit
Admission: RE | Admit: 2016-09-22 | Discharge: 2016-09-22 | Disposition: A | Discharge status: Home / Self Care | Payer: 59 | Source: Ambulatory Visit | Attending: Radiation Oncology | Admitting: Radiation Oncology

## 2016-09-22 DIAGNOSIS — Z510 Encounter for antineoplastic radiation therapy: Secondary | ICD-10-CM | POA: Diagnosis not present

## 2016-09-22 DIAGNOSIS — C50411 Malignant neoplasm of upper-outer quadrant of right female breast: Secondary | ICD-10-CM | POA: Diagnosis not present

## 2016-09-22 DIAGNOSIS — L259 Unspecified contact dermatitis, unspecified cause: Secondary | ICD-10-CM | POA: Diagnosis not present

## 2016-09-22 DIAGNOSIS — Z170 Estrogen receptor positive status [ER+]: Secondary | ICD-10-CM

## 2016-09-22 NOTE — Progress Notes (Signed)
Please see the Nurse Progress Note in the MD Initial Consult Encounter for this patient. 

## 2016-09-22 NOTE — Progress Notes (Signed)
Radiation Oncology         [phone] ________________________________  Name: [name] MRN: [id number]  Date: 09/22/2016  DOB: February 13, 1971  CC:Jonathon Jordan, MD  Erroll Luna, MD     REFERRING PHYSICIAN: Erroll Luna, MD   DIAGNOSIS: The encounter diagnosis was Malignant neoplasm of upper-outer quadrant of right breast in female, estrogen receptor positive ([place]).   Stage IA, [id number] Grade 1, ER/PR positive invasive lobular carcinoma of the right breast  HISTORY OF PRESENT ILLNESS:[name] is a 46 y.o. female with a history of right breast cancer.  The patient presented with an abnormal right breast distortion on her screening mammogram. Diagnostic mammogram on 08/05/16 showed an indeterminate right breast mass at the 12:30 position, 5 cm from the nipple. A biopsy on 08/06/16, revealed invasive mammary carcinoma with lobular neoplasia (atypical lobular hyperplasia). Receptors studies were completed and indicate that the tumor is estrogen receptor 100%, progesterone receptor 90%, and Her-2/neu was negative. The Ki-67 staining was 10%.  Bilateral breast MRI on 08/19/16 revealed a 7 x 6 x 7 mm enhancing mass in the upper-inner quadrant of the right breast. No adenopathy was seen nor was there any addition disease noted. She has since undergone right lumpectomy with sentinel node resection on 08/27/16 which revealed invasive lobular carcinoma, grade 1, with 2 foci stating 5 mm in the separate microscopic focus with lobular carcinoma in situ. All surgical margins were negative for invasive carcinoma. An additional medial margin was also negative for malignancy. Of the 6 lymph nodes removed, none contained evidence of disease. Due to the small size of her tumor, oncotype was not pursued. The patient then proceeded with drainage of a seroma two weeks ago. The patient then saw Dr. Burr Medico on 09/10/16 who has performed [facility] and estradiol labs to determine the patient's menopausal status.  These labs are pending. She comes today now ready to consider the role of adjuvant radiotherapy.  PREVIOUS RADIATION THERAPY: No  Past Medical History:  Past Medical History:  Diagnosis Date  . Breast cancer ([place]) 08/2016   right  . Dental crown present     Past Surgical History: Past Surgical History:  Procedure Laterality Date  . BREAST LUMPECTOMY WITH RADIOACTIVE SEED AND SENTINEL LYMPH NODE BIOPSY Right 08/27/2016   Procedure: RIGHT BREAST LUMPECTOMY WITH RADIOACTIVE SEED AND SENTINEL LYMPH NODE BIOPSY;  Surgeon: Erroll Luna, MD;  Location: [place];  Service: General;  Laterality: Right;  . DILATION AND EVACUATION  04/08/2008; 08/01/2008    Social History:  Social History   Social History  . Marital status: Divorced    Spouse name: N/A  . Number of children: N/A  . Years of education: N/A   Occupational History  . Not on file.   Social History Main Topics  . Smoking status: Never Smoker  . Smokeless tobacco: Never Used  . Alcohol use 0.6 - 1.2 oz/week    1 - 2 Glasses of wine per week     Comment: 3 x/week  . Drug use: No  . Sexual activity: Not on file   Other Topics Concern  . Not on file   Social History Narrative  . No narrative on file    Family History: Family History  Problem Relation Age of Onset  . Diabetes Mellitus II Father     ALLERGIES: Erythromycin   MEDICATIONS:  Current Outpatient Prescriptions  Medication Sig Dispense Refill  . ALPRAZolam (XANAX) 0.25 MG tablet Take 0.25 mg by mouth at bedtime  as needed for anxiety.    . diphenhydrAMINE (BENADRYL) 25 MG tablet Take 25 mg by mouth every 8 (eight) hours as needed for itching or allergies.    . fexofenadine (ALLEGRA) 180 MG tablet Take 180 mg by mouth daily.    Marland Kitchen FIBER ADULT GUMMIES PO Take by mouth daily.     Marland Kitchen ibuprofen (ADVIL,MOTRIN) 200 MG tablet Take 300 mg by mouth 3 (three) times daily.    . Melatonin 1 MG TABS Take 1 tablet by mouth daily as needed.    .  Multiple Vitamin (MULTIVITAMIN) tablet Take 1 tablet by mouth daily.    . vitamin C (ASCORBIC ACID) 500 MG tablet Take 500 mg by mouth daily.     No current facility-administered medications for this encounter.      REVIEW OF SYSTEMS:  On review of systems, the patient reports that she is doing well overall. she denies any chest pain, shortness of breath, cough, fevers, chills, night sweats, unintended weight changes. She denies any bowel or bladder disturbances, and denies abdominal pain, nausea or vomiting. She denies any new musculoskeletal or joint aches or pains. She reports pain and soreness of the right breast and axilla. She saw Dr. Marcello Moores yesterday for a rash and redness under the right axilla and breast which [place] the beach. In the past weekend she started using new pods for washing clothes. Her rash is thought to be an allergic reaction. A complete review of systems is obtained and is otherwise negative.    PHYSICAL EXAM:  height is _0  (1.702 m) and weight is 144 lb (65.3 kg). Her oral temperature is 97.9 F (36.6 C). Her blood pressure is 111/85 and her pulse is 67. Her respiration is 18 and oxygen saturation is 100%.   In general this is a well appearing caucasian female in no acute distress. She's alert and oriented x4 and appropriate throughout the examination. Cardiopulmonary assessment is negative for acute distress and she exhibits normal effort. The right breast is examined in postsurgical changes are noted with a well healing incision site. No evidence of cellulitic streaking or bleeding is identified, though the right breast reveals erythema consistent with contact dermatitis. No edema of the chest wall or upper extremity on the right is noted. She's unable to lift her right arm to 90 degrees currently.  ECOG = 0  0 - Asymptomatic (Fully active, able to carry on all predisease activities without restriction)  1 - Symptomatic but completely ambulatory  (Restricted in physically strenuous activity but ambulatory and able to carry out work of a light or sedentary nature. For example, light housework, office work)  2 - Symptomatic, <50% in bed during the day (Ambulatory and capable of all self care but unable to carry out any work activities. Up and about more than 50% of waking hours)  3 - Symptomatic, >50% in bed, but not bedbound (Capable of only limited self-care, confined to bed or chair 50% or more of waking hours)  4 - Bedbound (Completely disabled. Cannot carry on any self-care. Totally confined to bed or chair)  5 - Death   Eustace Pen MM, Creech RH, Tormey DC, et al. [phone]). "Toxicity and response criteria of the [facility]". [place] Oncol. 5 (6): 649-55    LABORATORY DATA:  Lab Results  Component Value Date   WBC 8.5 08/01/2008   HGB 14.2 08/01/2008   HCT 41.5 08/01/2008   MCV 94.2 08/01/2008   PLT  212 08/01/2008   No results found for: NA, K, CL, CO2 No results found for: ALT, AST, GGT, ALKPHOS, BILITOT    RADIOGRAPHY: Mm Breast Surgical Specimen  Result Date: 08/27/2016 CLINICAL DATA:  Right breast cancer status post surgical excision. EXAM: SPECIMEN RADIOGRAPH OF THE RIGHT BREAST COMPARISON:  Previous exam(s). FINDINGS: Status post excision of the right breast. The radioactive seed and biopsy marker clip are present, completely intact, and were marked for pathology. IMPRESSION: Specimen radiograph of the right breast. Electronically Signed   By: Abelardo Diesel M.D.   On: 08/27/2016 13:23   Mm Rt Radioactive Seed Loc Mammo Guide  Result Date: 08/26/2016 CLINICAL DATA:  Patient with recently diagnosed right breast cancer scheduled for lumpectomy requiring preoperative radioactive seed localization. EXAM: MAMMOGRAPHIC GUIDED RADIOACTIVE SEED LOCALIZATION OF THE RIGHT BREAST COMPARISON:  Previous exam(s). FINDINGS: Patient presents for radioactive seed localization prior to breast conservation surgery. I  met with the patient and we discussed the procedure of seed localization including benefits and alternatives. We discussed the high likelihood of a successful procedure. We discussed the risks of the procedure including infection, bleeding, tissue injury and further surgery. We discussed the low dose of radioactivity involved in the procedure. Informed, written consent was given. The usual time-out protocol was performed immediately prior to the procedure. Using mammographic guidance, sterile technique, 1% lidocaine and an I-125 radioactive seed, ribbon shaped clip within the upper right breast was localized using a medial approach. The follow-up mammogram images confirm the seed in the expected location and were marked for Dr. Brantley Stage. Follow-up survey of the patient confirms presence of the radioactive seed. Order number of I-125 seed:  [id number]. Total activity:  4.098 millicuries  Reference Date: 07/16/2016 The patient tolerated the procedure well and was released from the [place] [facility]. She was given instructions regarding seed removal. IMPRESSION: Radioactive seed localization right breast. No apparent complications. Electronically Signed   By: Franki Cabot M.D.   On: 08/26/2016 14:17       IMPRESSION/PLAN:  1. Stage IA, [id number] Grade 1, ER/PR positive invasive lobular carcinoma of the right breast.Dr. Lisbeth Renshaw discussed the pathology findings and reviews the nature of lobular disease. He reviews her pathology results and discusses the recommendations at this time to proceed with adjuvant radiotherapy to the right breast. She will move forward with antiestrogen therapy with Dr. Burr Medico following radiotherapy. We discussed the risks, benefits, short, and long term effects of radiotherapy, and the patient is interested in proceeding. Dr. Lisbeth Renshaw discussed the delivery and logistics of radiotherapy, and confirms the recommendations he gave her during their first visit which is for 6 1/2 weeks of treatment.  Written consent was obtained and a copy of this is given to the patient, the original placed in the chart. She will be scheduled for simulation next week so she can work on range of motion. 2. Contact dermatitis. We have recommended topical hydrocortisone and oral benadryl prn itching. We will follow this at her simulation appointment as well.  3. Limited range of motion following sentinel node excision. The patient will meet with PT tomorrow and we will plan for simulation once she's had a few days to work on concentrated exercises.  In a visit lasting 25 minutes, greater than 50% of the time was spent face to face discussing her pathology and reviewing the rational for radiotherapy, and coordinating the patient's care.  The above documentation reflects my direct findings during this shared patient visit. Please see the separate note by Dr. Lisbeth Renshaw  on this date for the remainder of the patient's plan of care.     Carola Rhine, PAC  This document serves as a record of services personally performed by Shona Simpson, PA-C and Kyung Rudd, MD. It was created on their behalf by Darcus Austin, a trained medical scribe. The creation of this record is based on the scribe's personal observations and the providers' statements to them. This document has been checked and approved by the attending provider.

## 2016-09-23 ENCOUNTER — Ambulatory Visit: Payer: 59 | Attending: Hematology | Admitting: Physical Therapy

## 2016-09-23 DIAGNOSIS — M25611 Stiffness of right shoulder, not elsewhere classified: Secondary | ICD-10-CM

## 2016-09-23 DIAGNOSIS — Z483 Aftercare following surgery for neoplasm: Secondary | ICD-10-CM | POA: Insufficient documentation

## 2016-09-23 DIAGNOSIS — M25511 Pain in right shoulder: Secondary | ICD-10-CM | POA: Insufficient documentation

## 2016-09-23 NOTE — Patient Instructions (Signed)

## 2016-09-23 NOTE — Therapy (Signed)
[place], [name], [address] Phone: [phone]   Fax:  [phone]  Physical Therapy Evaluation  Patient Details  Name: [name] MRN: [id number] Date of Birth: 10/07/1970 Referring Provider: Dr. Burr Medico   Encounter Date: 09/23/2016      PT End of Session - 09/23/16 1231    Visit Number 1   Number of Visits 9   Date for PT Re-Evaluation 10/23/16   PT Start Time 0850   PT Stop Time 0930   PT Time Calculation (min) 40 min   Activity Tolerance Patient tolerated treatment well   Behavior During Therapy [facility] for tasks assessed/performed      Past Medical History:  Diagnosis Date  . Breast cancer ([place]) 08/2016   right  . Dental crown present     Past Surgical History:  Procedure Laterality Date  . BREAST LUMPECTOMY WITH RADIOACTIVE SEED AND SENTINEL LYMPH NODE BIOPSY Right 08/27/2016   Procedure: RIGHT BREAST LUMPECTOMY WITH RADIOACTIVE SEED AND SENTINEL LYMPH NODE BIOPSY;  Surgeon: Erroll Luna, MD;  Location: [facility];  Service: General;  Laterality: Right;  . DILATION AND EVACUATION  04/08/2008; 08/01/2008    There were no vitals filed for this visit.       Subjective Assessment - 09/23/16 0858    Subjective Pt just developed the cording in her axilla within the past few days  Prior to that she was moving her arm well with good range of motion    Pertinent History diagnosed in March 2108 . On April 12 right breast lumpectomy with 6 nodes removed complicated with seroma drained about 2 weeks ago  Will not have have to have chemotherapy  Will have to have radiation.  Developed a rash the past weekend with a "cord" in her armpit    Patient Stated Goals I just want to have my life back    Currently in Pain? --  only if she presses on her seroma she tries to lift    Pain Location Axilla   Pain Orientation Right   Pain Descriptors / Indicators Tightness   Pain Frequency  Intermittent            [facility] PT Assessment - 09/23/16 0001      Assessment   Medical Diagnosis right breast cancer    Referring Provider Dr. Burr Medico    Onset Date/Surgical Date 08/07/16   Hand Dominance Right     Precautions   Precautions None     Restrictions   Weight Bearing Restrictions No     Balance Screen   Has the patient fallen in the past 6 months Yes  just lost balance from leaning forward    How many times? 1   falls will not be addressed this session    Has the patient had a decrease in activity level because of a fear of falling?  No   Is the patient reluctant to leave their home because of a fear of falling?  No     Home Environment   Living Environment Private residence   Living Arrangements Spouse/significant other   Available Help at Discharge Available PRN/intermittently     Prior Function   Level of Independence Independent   Vocation Full time employment   Vocation Requirements computer and desk work   Leisure exerciser 5 days a week with Pilates, Peloton, kickboxing yoga and weight lifting      Cognition   Overall Cognitive Status Within Functional Limits  for tasks assessed     Observation/Other Assessments   Observations bright red rash across right breast and lateral chest that appears to be in the distribution of her sports bra.  Pt states it is itchy. Pt has had the rash looked at by MD. and feels it may be a contact dermatitis possibly from a detergent    Skin Integrity healing incisions at right breast and axilla    Quick DASH  36.36     Sensation   Additional Comments some numbness in right lateral chest     Coordination   Gross Motor Movements are Fluid and Coordinated No  limited by pain in right axilla      Posture/Postural Control   Posture/Postural Control No significant limitations     AROM   Overall AROM Comments right shoulder range of motion limited by pain    Right Shoulder Flexion 115 Degrees   Right Shoulder ABduction  80 Degrees   Right Shoulder Internal Rotation 70 Degrees   Right Shoulder External Rotation 90 Degrees   Left Shoulder Flexion 170 Degrees   Left Shoulder ABduction 170 Degrees   Left Shoulder Internal Rotation 70 Degrees   Left Shoulder External Rotation 90 Degrees     Strength   Overall Strength Comments right shoulder limited by pain   Right Shoulder Flexion 3-/5   Right Shoulder ABduction 3-/5   Left Shoulder Flexion 5/5   Left Shoulder ABduction 5/5     Palpation   Palpation comment tightness at right lateral chest and above incision. Decreased scar mobility at axillary scar. tight "guitar string" cord palpable deep in axilla that is painful.  It does not extend down into upper arm or antcubital fossa, but pt feels some pain relief with neural stretch            LYMPHEDEMA/ONCOLOGY QUESTIONNAIRE - 09/23/16 0930      Right Upper Extremity Lymphedema   10 cm Proximal to Olecranon Process 25 cm   Olecranon Process 23.5 cm   15 cm Proximal to Ulnar Styloid Process 22 cm   Just Proximal to Ulnar Styloid Process 14.5 cm   Across Hand at [employer] 19.5 cm   At [place] of 2nd Digit 5.6 cm     Left Upper Extremity Lymphedema   10 cm Proximal to Olecranon Process 26 cm   Olecranon Process 23.5 cm   15 cm Proximal to Ulnar Styloid Process 22 cm   Just Proximal to Ulnar Styloid Process 15 cm   Across Hand at [employer] 19.5 cm   At [place] of 2nd Digit 5.5 cm           Quick Dash - 09/23/16 0001    Open a tight or new jar Mild difficulty   Do heavy household chores (wash walls, wash floors) Severe difficulty   Carry a shopping bag or briefcase Moderate difficulty   Wash your back Moderate difficulty   Use a knife to cut food No difficulty   Recreational activities in which you take some force or impact through your arm, shoulder, or hand (golf, hammering, tennis) Mild difficulty   During the past week, to what extent has your arm, shoulder or hand problem interfered  with your normal social activities with family, friends, neighbors, or groups? Slightly   During the past week, to what extent has your arm, shoulder or hand problem limited your work or other regular daily activities Quite a bit   Arm, shoulder, or hand pain.  Severe   Tingling (pins and needles) in your arm, shoulder, or hand None   Difficulty Sleeping No difficulty   DASH Score 36.36 %             [facility] Adult PT Treatment/Exercise - 09/23/16 0001      Self-Care   Self-Care Other Self-Care Comments   Other Self-Care Comments  pt wearing a sports  bra that fits snugly.  straps on sports bra lengthened to decrease pressing into axilla and soft cotten folded to pad the top of bra at axilla to make it more comfortable      Shoulder Exercises: Supine   Other Supine Exercises instructed in supine dowel rod flexion and abduction  that she as able to perform.  limited in "radiation postition " exercise due to pain                 PT Education - 09/23/16 1230    Education provided Yes   Education Details supine dowel rod flexion and abduction    Person(s) Educated Patient   Methods Explanation;Demonstration;Handout   Comprehension Verbalized understanding;Returned demonstration                [facility] Goals - 09/23/16 1255      CC Long Term Goal  #1   Title Patient with verbalize an understanding of lymphedema risk reduction precautions   Time 4   Period Weeks   Status New     CC Long Term Goal  #2   Title Patient will improve right t shoulder abduction to 120 degrees so that she can achieve position needed for radiation therapy.   Time 4   Period Weeks   Status New     CC Long Term Goal  #3   Title Patient will be independent in a  home exercise program for shoulder range of motion and strength   Time 4   Period Weeks   Status New     CC Long Term Goal  #4   Title Patient will decrease the DASH score to < 19   to demonstrate increased functional  use of upper extremity   Baseline 36.36   Time 4   Period Weeks   Status New            Plan - 09/23/16 1235    Clinical Impression Statement 46 yo female s/p right lumpectomy with 6 nodes removed complicated by seroma that was aspirated, now with axillary cording and red rash over breast.  She has significant pain in right shoulder with decreased range oif motion and strength. She is at risk for developing lymphedema with 6 nodes removed and histroy of seroma and she appears overwhelmed with suddenness of diagnosis, surgery and treatment. Because of these factors this is a moderately complex eval    Rehab Potential Excellent   Clinical Impairments Affecting Rehab Potential 6 nodes removed. Seroma with aspiration    PT Frequency --  3 times a week for 2 weeks then decrease frequency as needed    PT Duration 4 weeks   PT Treatment/Interventions ADLs/Self Care Home Management;Patient/family education;Orthotic Fit/Training;Taping;Manual techniques;Manual lymph drainage;Therapeutic activities;Therapeutic exercise;Scar mobilization;Passive range of motion   PT Next Visit Plan Active and Passive range of motion to right shoulder with manual techniques for axillary cording including manual lymph drainage as needed to prepare for radiatin simulation next week. Assess use of sports bra. Make sure pt is signed up for ABC class.  Later teach Strengh ABC program  and exercise progression principles and assist pt with getting a prophylactic sleeve and gauntlet    Consulted and Agree with Plan of Care Patient      Patient will benefit from skilled therapeutic intervention in order to improve the following deficits and impairments:  Decreased skin integrity, Impaired sensation, Decreased scar mobility, Decreased knowledge of precautions, Decreased knowledge of use of DME, Increased fascial restricitons, Decreased strength, Impaired UE functional use, Pain, Decreased range of motion  Visit  Diagnosis: Aftercare following surgery for neoplasm  Acute pain of right shoulder  Stiffness of joint, shoulder region, right     Problem List Patient Active Problem List   Diagnosis Date Noted  . Malignant neoplasm of upper-outer quadrant of right breast in female, estrogen receptor positive ([place]) 08/24/2016   Donato Heinz. Owens Shark PT  Norwood Levo 09/23/2016, 12:59 PM  [place], [name], [address] Phone: [phone]   Fax:  [phone]  Name: KAYCEE HAYCRAFT MRN: [id number] Date of Birth: [birth year]

## 2016-09-24 ENCOUNTER — Ambulatory Visit: Payer: 59 | Admitting: Physical Therapy

## 2016-09-24 ENCOUNTER — Encounter: Payer: Self-pay | Admitting: Physical Therapy

## 2016-09-24 DIAGNOSIS — M25511 Pain in right shoulder: Secondary | ICD-10-CM

## 2016-09-24 DIAGNOSIS — M25611 Stiffness of right shoulder, not elsewhere classified: Secondary | ICD-10-CM

## 2016-09-24 DIAGNOSIS — Z483 Aftercare following surgery for neoplasm: Secondary | ICD-10-CM

## 2016-09-24 NOTE — Patient Instructions (Signed)
Abduction (Eccentric) - Active-Assist ([place])    With hands lightly holding pulley, raise affected arm out to side with other arm. Avoid hiking shoulder. Then slowly lower affected arm for 3-5 seconds. _20__ reps per set, __2_ sets per day, __7_ days per week.  http://ecce.exer.us/161   Copyright  VHI. All rights reserved.  Flexion (Eccentric) - Active-Assist ([place])    With hands lightly holding pulley, raise affected arm with other arm (or push outward during lift). Avoid hiking shoulder. Then slowly lower affected arm for 3-5 seconds. _20__ reps per set, _2__ sets per day, __7_ days per week.  http://ecce.exer.us/151   Copyright  VHI. All rights reserved.

## 2016-09-24 NOTE — Therapy (Signed)
[place], [name], [address] Phone: [phone]   Fax:  [phone]  Physical Therapy Treatment  Patient Details  Name: [name] MRN: [id number] Date of Birth: 08-16-1970 Referring Provider: Dr. Burr Medico   Encounter Date: 09/24/2016      PT End of Session - 09/24/16 1156    Visit Number 2   Number of Visits 9   Date for PT Re-Evaluation 10/23/16   PT Start Time 0844   PT Stop Time 0930   PT Time Calculation (min) 46 min   Activity Tolerance Patient tolerated treatment well   Behavior During Therapy [facility] for tasks assessed/performed      Past Medical History:  Diagnosis Date  . Breast cancer ([place]) 08/2016   right  . Dental crown present     Past Surgical History:  Procedure Laterality Date  . BREAST LUMPECTOMY WITH RADIOACTIVE SEED AND SENTINEL LYMPH NODE BIOPSY Right 08/27/2016   Procedure: RIGHT BREAST LUMPECTOMY WITH RADIOACTIVE SEED AND SENTINEL LYMPH NODE BIOPSY;  Surgeon: Erroll Luna, MD;  Location: [place];  Service: General;  Laterality: Right;  . DILATION AND EVACUATION  04/08/2008; 08/01/2008    There were no vitals filed for this visit.      Subjective Assessment - 09/24/16 0846    Subjective Did some cane exercises yesterday and I feel like I'm moving forward.   Pertinent History diagnosed in March 2108 . On April 12 right breast lumpectomy with 6 nodes removed complicated with seroma drained about 2 weeks ago  Will not have have to have chemotherapy  Will have to have radiation.  Developed a rash the past weekend with a "cord" in her armpit    Patient Stated Goals I just want to have my life back    Currently in Pain? No/denies  None at rest               LYMPHEDEMA/ONCOLOGY QUESTIONNAIRE - 09/23/16 0930      Right Upper Extremity Lymphedema   10 cm Proximal to Olecranon Process 25 cm   Olecranon Process 23.5 cm   15 cm Proximal to Ulnar  Styloid Process 22 cm   Just Proximal to Ulnar Styloid Process 14.5 cm   Across Hand at [employer] 19.5 cm   At [place] of 2nd Digit 5.6 cm     Left Upper Extremity Lymphedema   10 cm Proximal to Olecranon Process 26 cm   Olecranon Process 23.5 cm   15 cm Proximal to Ulnar Styloid Process 22 cm   Just Proximal to Ulnar Styloid Process 15 cm   Across Hand at [employer] 19.5 cm   At [place] of 2nd Digit 5.5 cm                  [facility] Adult PT Treatment/Exercise - 09/24/16 0001      Exercises   Exercises Shoulder     Shoulder Exercises: Pulleys   Flexion 3 minutes   Flexion Limitations PT verbal and tactile cues for technique   ABduction 2 minutes   ABduction Limitations PT verbal cues and tactile cues for proper technique     Manual Therapy   Manual Therapy Neural Stretch;Passive ROM;Myofascial release;Soft tissue mobilization   Manual therapy comments Scapular mobilizations to right scapula in left sidelying as tolerated.   Soft tissue mobilization To right lateral shoulder and tricep region in left sidelying with Biotone   Myofascial Release To right axillary region  Passive ROM PROM right shoulder and UE in supine to pt tolerance all planes focused on abduction and ER   Neural Stretch Passive neural stretching to right UE in supine to tolerance                PT Education - 09/24/16 0930    Education provided Yes   Education Details Pulleys - flexion and abduction   Person(s) Educated Patient   Methods Explanation;Demonstration;Handout   Comprehension Verbalized understanding;Returned demonstration                [facility] Goals - 09/23/16 1255      CC Long Term Goal  #1   Title Patient with verbalize an understanding of lymphedema risk reduction precautions   Time 4   Period Weeks   Status New     CC Long Term Goal  #2   Title Patient will improve right t shoulder abduction to 120 degrees so that she can achieve position  needed for radiation therapy.   Time 4   Period Weeks   Status New     CC Long Term Goal  #3   Title Patient will be independent in a  home exercise program for shoulder range of motion and strength   Time 4   Period Weeks   Status New     CC Long Term Goal  #4   Title Patient will decrease the DASH score to < 19   to demonstrate increased functional use of upper extremity   Baseline 36.36   Time 4   Period Weeks   Status New            Plan - 09/24/16 1156    Clinical Impression Statement Patient has significant cording present in her right axilla with sudden onset. She responded well to therapy today and demonstrated increased ROM afterwards. She has fairly significant pain with manual techniques but tolerated well. She will benefit from continued PT to reduce cording.   Rehab Potential Excellent   Clinical Impairments Affecting Rehab Potential 6 nodes removed. Seroma with aspiration    PT Duration 4 weeks   PT Treatment/Interventions ADLs/Self Care Home Management;Patient/family education;Orthotic Fit/Training;Taping;Manual techniques;Manual lymph drainage;Therapeutic activities;Therapeutic exercise;Scar mobilization;Passive range of motion   PT Next Visit Plan Active and Passive range of motion to right shoulder with manual techniques for axillary cording. Myofascial release as tolerated (very painful today); scapular mobs   PT Home Exercise Plan Pulleys (pt plans to buy shoulder pulleys today)   Consulted and Agree with Plan of Care Patient      Patient will benefit from skilled therapeutic intervention in order to improve the following deficits and impairments:  Decreased skin integrity, Impaired sensation, Decreased scar mobility, Decreased knowledge of precautions, Decreased knowledge of use of DME, Increased fascial restricitons, Decreased strength, Impaired UE functional use, Pain, Decreased range of motion  Visit Diagnosis: Aftercare following surgery for  neoplasm  Acute pain of right shoulder  Stiffness of joint, shoulder region, right     Problem List Patient Active Problem List   Diagnosis Date Noted  . Malignant neoplasm of upper-outer quadrant of right breast in female, estrogen receptor positive ([place]) 08/24/2016    Annia Friendly, PT 09/24/16 12:00 PM  [place], [name], [address] Phone: [phone]   Fax:  [phone]  Name: [name] MRN: [id number] Date of Birth: 03-Sep-1970

## 2016-09-28 ENCOUNTER — Ambulatory Visit: Payer: 59

## 2016-09-28 ENCOUNTER — Ambulatory Visit
Admission: RE | Admit: 2016-09-28 | Discharge: 2016-09-28 | Disposition: A | Discharge status: Home / Self Care | Payer: 59 | Source: Ambulatory Visit | Attending: Radiation Oncology | Admitting: Radiation Oncology

## 2016-09-28 DIAGNOSIS — M25611 Stiffness of right shoulder, not elsewhere classified: Secondary | ICD-10-CM

## 2016-09-28 DIAGNOSIS — Z510 Encounter for antineoplastic radiation therapy: Secondary | ICD-10-CM | POA: Diagnosis not present

## 2016-09-28 DIAGNOSIS — Z170 Estrogen receptor positive status [ER+]: Secondary | ICD-10-CM

## 2016-09-28 DIAGNOSIS — Z483 Aftercare following surgery for neoplasm: Secondary | ICD-10-CM

## 2016-09-28 DIAGNOSIS — C50411 Malignant neoplasm of upper-outer quadrant of right female breast: Secondary | ICD-10-CM

## 2016-09-28 DIAGNOSIS — M25511 Pain in right shoulder: Secondary | ICD-10-CM

## 2016-09-28 NOTE — Therapy (Signed)
[place], [name], [address] Phone: [phone]   Fax:  [phone]  Physical Therapy Treatment  Patient Details  Name: [name] MRN: [id number] Date of Birth: [birth year] Referring Provider: Dr. Burr Medico   Encounter Date: 09/28/2016      PT End of Session - 09/28/16 0848    Visit Number 3   Number of Visits 9   Date for PT Re-Evaluation 10/23/16   PT Start Time 0805   PT Stop Time 0848   PT Time Calculation (min) 43 min   Activity Tolerance Patient tolerated treatment well   Behavior During Therapy [facility] for tasks assessed/performed      Past Medical History:  Diagnosis Date  . Breast cancer ([place]) 08/2016   right  . Dental crown present     Past Surgical History:  Procedure Laterality Date  . BREAST LUMPECTOMY WITH RADIOACTIVE SEED AND SENTINEL LYMPH NODE BIOPSY Right 08/27/2016   Procedure: RIGHT BREAST LUMPECTOMY WITH RADIOACTIVE SEED AND SENTINEL LYMPH NODE BIOPSY;  Surgeon: Erroll Luna, MD;  Location: [place];  Service: General;  Laterality: Right;  . DILATION AND EVACUATION  04/08/2008; 08/01/2008    There were no vitals filed for this visit.      Subjective Assessment - 09/28/16 0808    Subjective I got the pulleys and I've been stretching every day. I feel like the cording has really improved over the weekend. The rash has gotten so much better but my skin around the incision is still fairly tender. I have my radiation simulation today.    Pertinent History diagnosed in March 2108 . On April 12 right breast lumpectomy with 6 nodes removed complicated with seroma drained about 2 weeks ago  Will not have have to have chemotherapy  Will have to have radiation.  Developed a rash the past weekend with a "cord" in her armpit    Patient Stated Goals I just want to have my life back    Currently in Pain? No/denies                         [facility] Adult PT  Treatment/Exercise - 09/28/16 0001      Shoulder Exercises: Therapy Ball   Flexion 10 reps  With forward lean into end of stetch   ABduction 5 reps  Rt UE with side lean into end of stretch     Manual Therapy   Manual Therapy Neural Stretch;Passive ROM;Myofascial release   Manual therapy comments --   Myofascial Release To right axillary region   Passive ROM PROM right shoulder and UE in supine to pt tolerance all planes focused on abduction and ER   Neural Stretch Passive neural stretching to right UE in supine to tolerance                        [facility] Goals - 09/23/16 1255      CC Long Term Goal  #1   Title Patient with verbalize an understanding of lymphedema risk reduction precautions   Time 4   Period Weeks   Status New     CC Long Term Goal  #2   Title Patient will improve right t shoulder abduction to 120 degrees so that she can achieve position needed for radiation therapy.   Time 4   Period Weeks   Status New     CC Long Term Goal  #  3   Title Patient will be independent in a  home exercise program for shoulder range of motion and strength   Time 4   Period Weeks   Status New     CC Long Term Goal  #4   Title Patient will decrease the DASH score to < 19   to demonstrate increased functional use of upper extremity   Baseline 36.36   Time 4   Period Weeks   Status New            Plan - 09/28/16 0849    Clinical Impression Statement Pts cording has significantly improved since last visit (per her report as this is first time this therapist has seen her) and continued to improve during stretching today. She reports her ROM much improved, though cording still present and tender in Rt axilla.  She got and has been using the pulleys at home over the weekend and reports this has helped with most improvement. Pt will benefit from continued therapy to help address the still present cording and end ROM defecits.    Rehab Potential Excellent    Clinical Impairments Affecting Rehab Potential 6 nodes removed. Seroma with aspiration    PT Frequency --  3x/wk for 2 weeks then decrease freq prn   PT Duration 4 weeks   PT Treatment/Interventions ADLs/Self Care Home Management;Patient/family education;Orthotic Fit/Training;Taping;Manual techniques;Manual lymph drainage;Therapeutic activities;Therapeutic exercise;Scar mobilization;Passive range of motion   PT Next Visit Plan Cont with ball and add doorway stretch. Active and Passive range of motion to right shoulder with manual techniques for axillary cording. Myofascial release as tolerated (very painful today); scapular mobs   Consulted and Agree with Plan of Care Patient      Patient will benefit from skilled therapeutic intervention in order to improve the following deficits and impairments:  Decreased skin integrity, Impaired sensation, Decreased scar mobility, Decreased knowledge of precautions, Decreased knowledge of use of DME, Increased fascial restricitons, Decreased strength, Impaired UE functional use, Pain, Decreased range of motion  Visit Diagnosis: Aftercare following surgery for neoplasm  Acute pain of right shoulder  Stiffness of joint, shoulder region, right     Problem List Patient Active Problem List   Diagnosis Date Noted  . Malignant neoplasm of upper-outer quadrant of right breast in female, estrogen receptor positive ([place]) 08/24/2016    [name], PTA 09/28/2016, 9:31 AM  [place], [name], [address] Phone: [phone]   Fax:  [phone]  Name: [name] MRN: [id number] Date of Birth: [birth year]

## 2016-09-30 ENCOUNTER — Ambulatory Visit: Payer: 59

## 2016-09-30 DIAGNOSIS — M25511 Pain in right shoulder: Secondary | ICD-10-CM

## 2016-09-30 DIAGNOSIS — M25611 Stiffness of right shoulder, not elsewhere classified: Secondary | ICD-10-CM

## 2016-09-30 DIAGNOSIS — Z483 Aftercare following surgery for neoplasm: Secondary | ICD-10-CM

## 2016-09-30 NOTE — Therapy (Signed)
[place], [name], [address] Phone: [phone]   Fax:  [phone]  Physical Therapy Treatment  Patient Details  Name: [name] MRN: [id number] Date of Birth: January 21, 1971 Referring Provider: Dr. Burr Medico   Encounter Date: 09/30/2016      PT End of Session - 09/30/16 1014    Visit Number 4   Number of Visits 9   Date for PT Re-Evaluation 10/23/16   PT Start Time 0846   PT Stop Time 0931   PT Time Calculation (min) 45 min   Activity Tolerance Patient tolerated treatment well   Behavior During Therapy [facility] for tasks assessed/performed      Past Medical History:  Diagnosis Date  . Breast cancer ([place]) 08/2016   right  . Dental crown present     Past Surgical History:  Procedure Laterality Date  . BREAST LUMPECTOMY WITH RADIOACTIVE SEED AND SENTINEL LYMPH NODE BIOPSY Right 08/27/2016   Procedure: RIGHT BREAST LUMPECTOMY WITH RADIOACTIVE SEED AND SENTINEL LYMPH NODE BIOPSY;  Surgeon: Erroll Luna, MD;  Location: [place];  Service: General;  Laterality: Right;  . DILATION AND EVACUATION  04/08/2008; 08/01/2008    There were no vitals filed for this visit.      Subjective Assessment - 09/30/16 0848    Subjective Monday night I had what looked like a little blister pop up right above my incision. I started putting some polysporin on it and it's gone down some with a little drainage which hasbeen clear. My rash continues to improve as well. and I can tell a huge improvement in my ROM every day. I have a regular check up with my surgeon on Monday.  My radiation simulation went well Monday.    Pertinent History diagnosed in March 2108 . On April 12 right breast lumpectomy with 6 nodes removed complicated with seroma drained about 2 weeks ago  Will not have have to have chemotherapy  Will have to have radiation.  Developed a rash the past weekend with a "cord" in her armpit    Patient  Stated Goals I just want to have my life back    Currently in Pain? No/denies            [facility] PT Assessment - 09/30/16 0001      AROM   Right Shoulder Flexion 138 Degrees   Right Shoulder ABduction 121 Degrees                     [facility] Adult PT Treatment/Exercise - 09/30/16 0001      Shoulder Exercises: Therapy Ball   Flexion 10 reps  With forward lean into end of stretch   ABduction 10 reps  Rt UE with side lean into end of stretch     Shoulder Exercises: Stretch   Corner Stretch 3 reps;10 seconds  In doorway     Manual Therapy   Manual Therapy Neural Stretch;Passive ROM;Myofascial release   Myofascial Release To right axillary region   Passive ROM PROM right shoulder and UE in supine to pt tolerance all planes focused on abduction and ER   Neural Stretch Passive neural stretching to right UE in supine to tolerance                        [facility] Goals - 09/23/16 1255      CC Long Term Goal  #1   Title Patient with verbalize  an understanding of lymphedema risk reduction precautions   Time 4   Period Weeks   Status New     CC Long Term Goal  #2   Title Patient will improve right t shoulder abduction to 120 degrees so that she can achieve position needed for radiation therapy.   Time 4   Period Weeks   Status New     CC Long Term Goal  #3   Title Patient will be independent in a  home exercise program for shoulder range of motion and strength   Time 4   Period Weeks   Status New     CC Long Term Goal  #4   Title Patient will decrease the DASH score to < 19   to demonstrate increased functional use of upper extremity   Baseline 36.36   Time 4   Period Weeks   Status New            Plan - 09/30/16 1014    Clinical Impression Statement Assessed blister pt c/o and it is red and has min pus-like drainage oozing from area so instructed pt to call doctor about this today. She reports it is already improved though from  Monday night when she first noticed it. Pt continues with excellent gains since initial visit last week. Her A/ROM has significantly improved and she reports noticing improvements as well,like being able to shampoo back o fher head without trouble now. Pt is progrssing well and will benefit from continued therapy to help her regain full ROM and decrease tightness from cording, which is improved also but still present/palpable.    Rehab Potential Excellent   Clinical Impairments Affecting Rehab Potential 6 nodes removed. Seroma with aspiration    PT Frequency --  3x/wk for 2 weeks then decrease freq prn   PT Duration 4 weeks   PT Next Visit Plan Cont with ball and doorway stretch (add to HEP). Active and Passive range of motion to right shoulder with manual techniques for axillary cording. Myofascial release as tolerated (very painful today); scapular mobs   Consulted and Agree with Plan of Care Patient      Patient will benefit from skilled therapeutic intervention in order to improve the following deficits and impairments:  Decreased skin integrity, Impaired sensation, Decreased scar mobility, Decreased knowledge of precautions, Decreased knowledge of use of DME, Increased fascial restricitons, Decreased strength, Impaired UE functional use, Pain, Decreased range of motion  Visit Diagnosis: Aftercare following surgery for neoplasm  Acute pain of right shoulder  Stiffness of joint, shoulder region, right     Problem List Patient Active Problem List   Diagnosis Date Noted  . Malignant neoplasm of upper-outer quadrant of right breast in female, estrogen receptor positive ([place]) 08/24/2016    [name], PTA 09/30/2016, 10:19 AM  [place], [name], [address] Phone: [phone]   Fax:  [phone]  Name: [name] MRN: [id number] Date of Birth: 05-Mar-1971

## 2016-10-01 DIAGNOSIS — Z510 Encounter for antineoplastic radiation therapy: Secondary | ICD-10-CM | POA: Diagnosis not present

## 2016-10-01 DIAGNOSIS — C50411 Malignant neoplasm of upper-outer quadrant of right female breast: Secondary | ICD-10-CM | POA: Diagnosis not present

## 2016-10-01 NOTE — Progress Notes (Signed)
  Radiation Oncology         [phone] ________________________________  Name: [name] MRN: [id number]  Date: 09/28/2016  DOB: 1971-03-25  DIAGNOSIS:     ICD-9-CM ICD-10-CM   1. Malignant neoplasm of upper-outer quadrant of right breast in female, estrogen receptor positive ([place]) 174.4 C50.411    V86.0 Z17.0      SIMULATION AND TREATMENT PLANNING NOTE  The patient presented for simulation prior to beginning her course of radiation treatment for her diagnosis of right-sided breast cancer. The patient was placed in a supine position on a breast board. A customized vac-lock bag was constructed and this complex treatment device will be used on a daily basis during her treatment. In this fashion, a CT scan was obtained through the chest area and an isocenter was placed near the chest wall within the breast.  The patient will be planned to receive a course of radiation initially to a dose of 50.4 Gy. This will consist of a whole breast radiotherapy technique. To accomplish this, 2 customized blocks have been designed which will correspond to medial and lateral whole breast tangent fields. This treatment will be accomplished at 1.8 Gy per fraction. A forward planning technique will also be evaluated to determine if this approach improves the plan. It is anticipated that the patient will then receive a 10 Gy boost to the seroma cavity which has been contoured. This will be accomplished at 2 Gy per fraction.   This initial treatment will consist of a 3-D conformal technique. The seroma has been contoured as the primary target structure. Additionally, dose volume histograms of both this target as well as the lungs and heart will also be evaluated. Such an approach is necessary to ensure that the target area is adequately covered while the nearby critical  normal structures are adequately spared.  Plan:  The final anticipated total dose therefore will correspond to 60.4  Gy.    _______________________________   Jodelle Gross, MD, PhD

## 2016-10-02 ENCOUNTER — Encounter: Payer: 59 | Admitting: Physical Therapy

## 2016-10-05 ENCOUNTER — Ambulatory Visit
Admission: RE | Admit: 2016-10-05 | Discharge: 2016-10-05 | Disposition: A | Discharge status: Home / Self Care | Payer: 59 | Source: Ambulatory Visit | Attending: Radiation Oncology | Admitting: Radiation Oncology

## 2016-10-05 ENCOUNTER — Ambulatory Visit: Payer: 59

## 2016-10-05 DIAGNOSIS — C50411 Malignant neoplasm of upper-outer quadrant of right female breast: Secondary | ICD-10-CM | POA: Diagnosis not present

## 2016-10-05 DIAGNOSIS — Z510 Encounter for antineoplastic radiation therapy: Secondary | ICD-10-CM | POA: Diagnosis not present

## 2016-10-06 ENCOUNTER — Ambulatory Visit
Admission: RE | Admit: 2016-10-06 | Discharge: 2016-10-06 | Disposition: A | Discharge status: Home / Self Care | Payer: 59 | Source: Ambulatory Visit | Attending: Radiation Oncology | Admitting: Radiation Oncology

## 2016-10-06 ENCOUNTER — Ambulatory Visit: Payer: 59

## 2016-10-06 DIAGNOSIS — Z483 Aftercare following surgery for neoplasm: Secondary | ICD-10-CM | POA: Diagnosis not present

## 2016-10-06 DIAGNOSIS — M25611 Stiffness of right shoulder, not elsewhere classified: Secondary | ICD-10-CM

## 2016-10-06 DIAGNOSIS — M25511 Pain in right shoulder: Secondary | ICD-10-CM

## 2016-10-06 DIAGNOSIS — Z510 Encounter for antineoplastic radiation therapy: Secondary | ICD-10-CM | POA: Diagnosis not present

## 2016-10-06 NOTE — Therapy (Signed)
[place], [name], [address] Phone: [phone]   Fax:  [phone]  Physical Therapy Treatment  Patient Details  Name: [name] MRN: [id number] Date of Birth: Dec 02, 1970 Referring Provider: Dr. Burr Medico   Encounter Date: 10/06/2016      PT End of Session - 10/06/16 0956    Visit Number 5   Number of Visits 9   Date for PT Re-Evaluation 10/23/16   PT Start Time 0850   PT Stop Time 0945   PT Time Calculation (min) 55 min   Activity Tolerance Patient tolerated treatment well   Behavior During Therapy [facility] for tasks assessed/performed      Past Medical History:  Diagnosis Date  . Breast cancer ([place]) 08/2016   right  . Dental crown present     Past Surgical History:  Procedure Laterality Date  . BREAST LUMPECTOMY WITH RADIOACTIVE SEED AND SENTINEL LYMPH NODE BIOPSY Right 08/27/2016   Procedure: RIGHT BREAST LUMPECTOMY WITH RADIOACTIVE SEED AND SENTINEL LYMPH NODE BIOPSY;  Surgeon: Erroll Luna, MD;  Location: [place];  Service: General;  Laterality: Right;  . DILATION AND EVACUATION  04/08/2008; 08/01/2008    There were no vitals filed for this visit.      Subjective Assessment - 10/06/16 0856    Subjective I saw my surgeon since I was here last and he wasn't worried about the blister and it looks so much better now from last week. He also cleared me for all activity and I don't need to see him anymore! I had my radiation simulation last week, which went really well as far as getting into position, and final xrays yesterday so I start that today.    Pertinent History diagnosed in March 2108 . On April 12 right breast lumpectomy with 6 nodes removed complicated with seroma drained about 2 weeks ago  Will not have have to have chemotherapy  Will have to have radiation.  Developed a rash the past weekend with a "cord" in her armpit    Patient Stated Goals I just want to have my  life back    Currently in Pain? No/denies                         [facility] Adult PT Treatment/Exercise - 10/06/16 0001      Self-Care   Other Self-Care Comments  Instructed pt in lymphedema risk reductions and how to progress weights/exercises safely i regards to lymphedema risk. She verbalized understanding all and asked appropriate questions throughout. Also instructed her where she can get a compression sleeve.      Shoulder Exercises: Standing   Other Standing Exercises 3 way raises with 2 lbs 10 times each into flexion, scaption, abduction to 90 degrees with head/shoulders against wall and core engaged.      Shoulder Exercises: Therapy Ball   Flexion 10 reps  With forward lean into end of stretch   ABduction 10 reps  Rt UE with side lean into end of stretch     Manual Therapy   Manual Therapy Neural Stretch;Passive ROM;Myofascial release   Myofascial Release To right axillary region   Passive ROM PROM right shoulder and UE in supine to pt tolerance all planes focused on abduction and ER   Neural Stretch Passive neural stretching to right UE in supine to tolerance                PT Education -  10/06/16 0955    Education provided Yes   Education Details 3 way raises with 2 lbs and how to safely progress weights with keeping risk of lymphedema low; also where to get compression sleeve   Person(s) Educated Patient   Methods Explanation;Demonstration;Handout   Comprehension Verbalized understanding;Returned demonstration                [facility] Goals - 09/23/16 1255      CC Long Term Goal  #1   Title Patient with verbalize an understanding of lymphedema risk reduction precautions   Time 4   Period Weeks   Status New     CC Long Term Goal  #2   Title Patient will improve right t shoulder abduction to 120 degrees so that she can achieve position needed for radiation therapy.   Time 4   Period Weeks   Status New     CC Long Term Goal   #3   Title Patient will be independent in a  home exercise program for shoulder range of motion and strength   Time 4   Period Weeks   Status New     CC Long Term Goal  #4   Title Patient will decrease the DASH score to < 19   to demonstrate increased functional use of upper extremity   Baseline 36.36   Time 4   Period Weeks   Status New            Plan - 10/06/16 7425    Clinical Impression Statement Pts blister has healed well since last visit and surgeon also checked  it and reported it okay and healing well. He also released her and cleared her for all activity. So spent time today instructing pt on lymphedema risk reductions, how to progress exercises/weights safely, and where she can obtain a compression sleeve as she expressed interest in this. Though still limited in end ROM pt is progressing very well towards increasing ROM and decreasing cording symptoms which, though also still present, where much improved today.    Rehab Potential Excellent   Clinical Impairments Affecting Rehab Potential 6 nodes removed. Seroma with aspiration    PT Frequency --  3x/wk for 2 weeks then decrease frequency prn   PT Duration 4 weeks   PT Treatment/Interventions ADLs/Self Care Home Management;Patient/family education;Orthotic Fit/Training;Taping;Manual techniques;Manual lymph drainage;Therapeutic activities;Therapeutic exercise;Scar mobilization;Passive range of motion   PT Next Visit Plan Measure A/ROM/assess goals next. Issue handout for lymphedema risk reduction practices (went over most of it with pt this visit, can ask if she has any further questions)Cont with ball and doorway stretch (add to HEP). Add scapular mobs in Lt S/L; Cont Active and Passive range of motion to right shoulder with manual techniques for axillary cording. Myofascial release as tolerated.   PT Home Exercise Plan Added 3 way raises   Consulted and Agree with Plan of Care Patient      Patient will benefit from  skilled therapeutic intervention in order to improve the following deficits and impairments:  Decreased skin integrity, Impaired sensation, Decreased scar mobility, Decreased knowledge of precautions, Decreased knowledge of use of DME, Increased fascial restricitons, Decreased strength, Impaired UE functional use, Pain, Decreased range of motion  Visit Diagnosis: Aftercare following surgery for neoplasm  Acute pain of right shoulder  Stiffness of joint, shoulder region, right     Problem List Patient Active Problem List   Diagnosis Date Noted  . Malignant neoplasm of upper-outer quadrant of right  breast in female, estrogen receptor positive ([place]) 08/24/2016    [name], PTA 10/06/2016, 10:11 AM  [place], [name], [address] Phone: [phone]   Fax:  [phone]  Name: [name] MRN: [id number] Date of Birth: [birth year]

## 2016-10-06 NOTE — Patient Instructions (Signed)

## 2016-10-07 ENCOUNTER — Ambulatory Visit: Payer: 59 | Admitting: Physical Therapy

## 2016-10-07 ENCOUNTER — Ambulatory Visit
Admission: RE | Admit: 2016-10-07 | Discharge: 2016-10-07 | Disposition: A | Discharge status: Home / Self Care | Payer: 59 | Source: Ambulatory Visit | Attending: Radiation Oncology | Admitting: Radiation Oncology

## 2016-10-07 DIAGNOSIS — Z483 Aftercare following surgery for neoplasm: Secondary | ICD-10-CM

## 2016-10-07 DIAGNOSIS — M25511 Pain in right shoulder: Secondary | ICD-10-CM

## 2016-10-07 DIAGNOSIS — M25611 Stiffness of right shoulder, not elsewhere classified: Secondary | ICD-10-CM

## 2016-10-07 DIAGNOSIS — Z510 Encounter for antineoplastic radiation therapy: Secondary | ICD-10-CM | POA: Diagnosis not present

## 2016-10-07 NOTE — Patient Instructions (Signed)
[email or web address] Courses Online Strength After Breast Cancer Look at the right of the page for Lymphedema Education Session  

## 2016-10-07 NOTE — Therapy (Signed)
[place], [name], [address] Phone: [phone]   Fax:  [phone]  Physical Therapy Treatment  Patient Details  Name: [name] MRN: [id number] Date of Birth: 02-May-1971 Referring Provider: Dr. Burr Medico   Encounter Date: 10/07/2016      PT End of Session - 10/07/16 1054    Visit Number 6   Number of Visits 9   Date for PT Re-Evaluation 10/23/16   PT Start Time 0850   PT Stop Time 0930   PT Time Calculation (min) 40 min   Activity Tolerance Patient tolerated treatment well   Behavior During Therapy [facility] for tasks assessed/performed      Past Medical History:  Diagnosis Date  . Breast cancer ([place]) 08/2016   right  . Dental crown present     Past Surgical History:  Procedure Laterality Date  . BREAST LUMPECTOMY WITH RADIOACTIVE SEED AND SENTINEL LYMPH NODE BIOPSY Right 08/27/2016   Procedure: RIGHT BREAST LUMPECTOMY WITH RADIOACTIVE SEED AND SENTINEL LYMPH NODE BIOPSY;  Surgeon: Erroll Luna, MD;  Location: [place];  Service: General;  Laterality: Right;  . DILATION AND EVACUATION  04/08/2008; 08/01/2008    There were no vitals filed for this visit.      Subjective Assessment - 10/07/16 0855    Subjective "It's so much better than it was"    Pertinent History diagnosed in March 2108 . On April 12 right breast lumpectomy with 6 nodes removed complicated with seroma drained about 2 weeks ago  Will not have have to have chemotherapy  Will have to have radiation.  Developed a rash the past weekend with a "cord" in her armpit    Patient Stated Goals I just want to have my life back    Currently in Pain? No/denies            [facility] PT Assessment - 10/07/16 0001      Observation/Other Assessments   Observations guitar string axillary cording visible and palpabe in right axilla and pt feels it down into forearm  small amount of fullness at right lateral and posterior  chest at scapular area. Pt will wear her binder at night      AROM   Right Shoulder Flexion 156 Degrees   Right Shoulder ABduction 180 Degrees                     [facility] Adult PT Treatment/Exercise - 10/07/16 0001      Self-Care   Self-Care Other Self-Care Comments   Other Self-Care Comments  Reviewed all lymphedema risk reduction and issued handout from ABC class.  Pt voiced understanding.  Also issued piece of small tg soft for pt to wear in evenings for support to lymphatics to see if it will help decrease axillary cording.      Shoulder Exercises: Pulleys   Flexion 3 minutes   ABduction 3 minutes     Shoulder Exercises: ROM/Strengthening   Ball on Wall yellow ball up the wall x 10 reps with stretch up at the top.    Other ROM/Strengthening Exercises verbally reviewed all exercises in Strength ABC program.  Pt has been an exerciser for a long time and is familiar with them and how to progress    Other ROM/Strengthening Exercises modified downward dog for neural stretch along arm as well as stretch to lateral chest                 PT  Education - 10/07/16 1053    Education provided Yes   Education Details lyphedema risk reduction with ABC handout and [employer] weblink, strength ABC packet    Person(s) Educated Patient   Methods Explanation;Handout   Comprehension Verbalized understanding                [facility] Goals - 10/07/16 [phone]      CC Long Term Goal  #1   Title Patient with verbalize an understanding of lymphedema risk reduction precautions   Status Achieved     CC Long Term Goal  #2   Title Patient will improve right t shoulder abduction to 120 degrees so that she can achieve position needed for radiation therapy.   Baseline 180  on 10/07/2016   Status Achieved     CC Long Term Goal  #3   Title Patient will be independent in a  home exercise program for shoulder range of motion and strength   Status Achieved             Plan - 10/07/16 1054    Clinical Impression Statement Pt is progressing well.  She still has axillary cording with pulling down into forearm and limitation in end range of shoulder flexion.  She is independent in home exercise and will continue to work on stretching and strengthening and use compression at home. Will check back next week but anticipate she will be ready to discharge that visit.    Rehab Potential Excellent   Clinical Impairments Affecting Rehab Potential 6 nodes removed. Seroma with aspiration    PT Frequency 2x / week   PT Duration 4 weeks   PT Treatment/Interventions ADLs/Self Care Home Management;Patient/family education;Orthotic Fit/Training;Taping;Manual techniques;Manual lymph drainage;Therapeutic activities;Therapeutic exercise;Scar mobilization;Passive range of motion   PT Next Visit Plan Reassess cording and shoudler range of motion.  if pt is continuing to prgress, discharge .  Issue Quick DASH for final goal achievement.    Consulted and Agree with Plan of Care Patient      Patient will benefit from skilled therapeutic intervention in order to improve the following deficits and impairments:  Decreased skin integrity, Impaired sensation, Decreased scar mobility, Decreased knowledge of precautions, Decreased knowledge of use of DME, Increased fascial restricitons, Decreased strength, Impaired UE functional use, Pain, Decreased range of motion  Visit Diagnosis: Aftercare following surgery for neoplasm  Acute pain of right shoulder  Stiffness of joint, shoulder region, right     Problem List Patient Active Problem List   Diagnosis Date Noted  . Malignant neoplasm of upper-outer quadrant of right breast in female, estrogen receptor positive ([place]) 08/24/2016   Donato Heinz. Owens Shark PT  Norwood Levo 10/07/2016, 10:58 AM  [place], [name], [address] Phone: [phone]   Fax:   [phone]  Name: [name] MRN: [id number] Date of Birth: 1970-11-19

## 2016-10-08 ENCOUNTER — Ambulatory Visit
Admission: RE | Admit: 2016-10-08 | Discharge: 2016-10-08 | Disposition: A | Discharge status: Home / Self Care | Payer: 59 | Source: Ambulatory Visit | Attending: Radiation Oncology | Admitting: Radiation Oncology

## 2016-10-08 DIAGNOSIS — C50411 Malignant neoplasm of upper-outer quadrant of right female breast: Secondary | ICD-10-CM | POA: Diagnosis not present

## 2016-10-08 DIAGNOSIS — Z510 Encounter for antineoplastic radiation therapy: Secondary | ICD-10-CM | POA: Diagnosis not present

## 2016-10-09 ENCOUNTER — Ambulatory Visit
Admission: RE | Admit: 2016-10-09 | Discharge: 2016-10-09 | Disposition: A | Discharge status: Home / Self Care | Payer: 59 | Source: Ambulatory Visit | Attending: Radiation Oncology | Admitting: Radiation Oncology

## 2016-10-09 ENCOUNTER — Other Ambulatory Visit (HOSPITAL_BASED_OUTPATIENT_CLINIC_OR_DEPARTMENT_OTHER): Payer: 59

## 2016-10-09 ENCOUNTER — Encounter: Payer: 59 | Admitting: Physical Therapy

## 2016-10-09 DIAGNOSIS — Z483 Aftercare following surgery for neoplasm: Secondary | ICD-10-CM | POA: Diagnosis not present

## 2016-10-09 DIAGNOSIS — Z170 Estrogen receptor positive status [ER+]: Secondary | ICD-10-CM

## 2016-10-09 DIAGNOSIS — C50411 Malignant neoplasm of upper-outer quadrant of right female breast: Secondary | ICD-10-CM | POA: Diagnosis not present

## 2016-10-09 DIAGNOSIS — Z510 Encounter for antineoplastic radiation therapy: Secondary | ICD-10-CM | POA: Diagnosis not present

## 2016-10-09 LAB — CBC WITH DIFFERENTIAL/PLATELET
BASO%: 1.1 % (ref 0.0–2.0)
BASOS ABS: 0.1 10*3/uL (ref 0.0–0.1)
EOS ABS: 0.1 10*3/uL (ref 0.0–0.5)
EOS%: 1.8 % (ref 0.0–7.0)
HCT: 41.1 % (ref 34.8–46.6)
HGB: 14.3 g/dL (ref 11.6–15.9)
LYMPH%: 26.5 % (ref 14.0–49.7)
MCH: 33.2 pg (ref 25.1–34.0)
MCHC: 34.7 g/dL (ref 31.5–36.0)
MCV: 95.6 fL (ref 79.5–101.0)
MONO#: 0.6 10*3/uL (ref 0.1–0.9)
MONO%: 8.1 % (ref 0.0–14.0)
NEUT#: 4.4 10*3/uL (ref 1.5–6.5)
NEUT%: 62.5 % (ref 38.4–76.8)
Platelets: 248 10*3/uL (ref 145–400)
RBC: 4.3 10*6/uL (ref 3.70–5.45)
RDW: 12.8 % (ref 11.2–14.5)
WBC: 7 10*3/uL (ref 3.9–10.3)
lymph#: 1.9 10*3/uL (ref 0.9–3.3)

## 2016-10-09 LAB — COMPREHENSIVE METABOLIC PANEL
ALT: 12 U/L (ref 0–55)
ANION GAP: 7 meq/L (ref 3–11)
AST: 18 U/L (ref 5–34)
Albumin: 3.9 g/dL (ref 3.5–5.0)
Alkaline Phosphatase: 56 U/L (ref 40–150)
BILIRUBIN TOTAL: 1.15 mg/dL (ref 0.20–1.20)
BUN: 13.5 mg/dL (ref 7.0–26.0)
CO2: 27 meq/L (ref 22–29)
CREATININE: 0.9 mg/dL (ref 0.6–1.1)
Calcium: 9.5 mg/dL (ref 8.4–10.4)
Chloride: 106 mEq/L (ref 98–109)
EGFR: 80 mL/min/{1.73_m2} — AB (ref >90)
GLUCOSE: 54 mg/dL — AB (ref 70–140)
POTASSIUM: 4.5 meq/L (ref 3.5–5.1)
SODIUM: 140 meq/L (ref 136–145)
Total Protein: 6.8 g/dL (ref 6.4–8.3)

## 2016-10-09 MED ORDER — RADIAPLEXRX EX GEL
Freq: Two times a day (BID) | CUTANEOUS | Status: DC
  Administered 2016-10-09: 15:00:00 via TOPICAL

## 2016-10-09 MED ORDER — ALRA NON-METALLIC DEODORANT (RAD-ONC)
1.0000 "application " | Freq: Once | TOPICAL | Status: AC
  Administered 2016-10-09: 1 via TOPICAL

## 2016-10-10 LAB — FOLLICLE STIMULATING HORMONE: FSH: 7.8 m[IU]/mL

## 2016-10-10 LAB — ESTRADIOL: Estradiol: 27.9 pg/mL

## 2016-10-13 ENCOUNTER — Ambulatory Visit
Admission: RE | Admit: 2016-10-13 | Discharge: 2016-10-13 | Disposition: A | Discharge status: Home / Self Care | Payer: 59 | Source: Ambulatory Visit | Attending: Radiation Oncology | Admitting: Radiation Oncology

## 2016-10-13 DIAGNOSIS — C50411 Malignant neoplasm of upper-outer quadrant of right female breast: Secondary | ICD-10-CM | POA: Diagnosis not present

## 2016-10-13 DIAGNOSIS — Z510 Encounter for antineoplastic radiation therapy: Secondary | ICD-10-CM | POA: Diagnosis not present

## 2016-10-14 ENCOUNTER — Ambulatory Visit
Admission: RE | Admit: 2016-10-14 | Discharge: 2016-10-14 | Disposition: A | Discharge status: Home / Self Care | Payer: 59 | Source: Ambulatory Visit | Attending: Radiation Oncology | Admitting: Radiation Oncology

## 2016-10-14 ENCOUNTER — Ambulatory Visit: Payer: 59 | Admitting: Physical Therapy

## 2016-10-14 DIAGNOSIS — M25511 Pain in right shoulder: Secondary | ICD-10-CM

## 2016-10-14 DIAGNOSIS — C50411 Malignant neoplasm of upper-outer quadrant of right female breast: Secondary | ICD-10-CM | POA: Diagnosis not present

## 2016-10-14 DIAGNOSIS — Z483 Aftercare following surgery for neoplasm: Secondary | ICD-10-CM

## 2016-10-14 DIAGNOSIS — M25611 Stiffness of right shoulder, not elsewhere classified: Secondary | ICD-10-CM

## 2016-10-14 DIAGNOSIS — Z510 Encounter for antineoplastic radiation therapy: Secondary | ICD-10-CM | POA: Diagnosis not present

## 2016-10-14 NOTE — Therapy (Signed)
[place], [name], [address] Phone: [phone]   Fax:  [phone]  Physical Therapy Treatment  Patient Details  Name: [name] MRN: [id number] Date of Birth: [birth year] Referring Provider: Dr. Burr Medico   Encounter Date: 10/14/2016      PT End of Session - 10/14/16 1250    Visit Number 7   Number of Visits 9   Date for PT Re-Evaluation 10/23/16   PT Start Time 2458   PT Stop Time 0930   PT Time Calculation (min) 43 min      Past Medical History:  Diagnosis Date  . Breast cancer ([place]) 08/2016   right  . Dental crown present     Past Surgical History:  Procedure Laterality Date  . BREAST LUMPECTOMY WITH RADIOACTIVE SEED AND SENTINEL LYMPH NODE BIOPSY Right 08/27/2016   Procedure: RIGHT BREAST LUMPECTOMY WITH RADIOACTIVE SEED AND SENTINEL LYMPH NODE BIOPSY;  Surgeon: Erroll Luna, MD;  Location: [place];  Service: General;  Laterality: Right;  . DILATION AND EVACUATION  04/08/2008; 08/01/2008    There were no vitals filed for this visit.      Subjective Assessment - 10/14/16 1245    Subjective Pt reports she continues to improve but still has the cording and pain  when she lifts her arm up a certain way    Pertinent History diagnosed in March 2108 . On April 12 right breast lumpectomy with 6 nodes removed complicated with seroma drained about 2 weeks ago  Will not have have to have chemotherapy  Will have to have radiation.  Developed a rash the past weekend with a "cord" in her armpit    Patient Stated Goals I just want to have my life back    Currently in Pain? No/denies  except when she moves her arm in elevation and abduction with external rotation  which stretches cording    Pain Location Axilla   Pain Orientation Right   Pain Descriptors / Indicators --  pulling    Pain Frequency Intermittent                         [facility] Adult PT  Treatment/Exercise - 10/14/16 0001      Self-Care   Other Self-Care Comments  medium tg soft to upper arm with small tg soft to lower arm with coverage at antecubital fossa to support lymphatic system      Manual Therapy   Manual Therapy Manual Lymphatic Drainage (MLD)   Myofascial Release gently myofascial release to cording in right axilla    Manual Lymphatic Drainage (MLD) short neck diphragmatic breathing, shoulder collectors upper arm to antecubital fossa to forearm with return along pathways    Neural Stretch Passive neural stretching to right UE in supine to tolerance                        [facility] Goals - 10/07/16 0998      CC Long Term Goal  #1   Title Patient with verbalize an understanding of lymphedema risk reduction precautions   Status Achieved     CC Long Term Goal  #2   Title Patient will improve right t shoulder abduction to 120 degrees so that she can achieve position needed for radiation therapy.   Baseline 180  on 10/07/2016   Status Achieved     CC Long Term Goal  #3  Title Patient will be independent in a  home exercise program for shoulder range of motion and strength   Status Achieved            Plan - 10/14/16 1250    Clinical Impression Statement Pt is doing well with radiation and is continuing to do stretches and exercise at home. She did not tolerate small tg soft so upgraded to size medium to see if it will help with cording.  Guitar strings remain in axilla.  Provided encouragement and support that these will likely resolve as she completes radition and continues to heal.  Checked on prescription as it has not been returned from dr office will continue to follow up with pt on weekly basis    Rehab Potential Excellent   Clinical Impairments Affecting Rehab Potential 6 nodes removed. Seroma with aspiration    PT Frequency 2x / week   PT Duration 4 weeks   PT Treatment/Interventions ADLs/Self Care Home  Management;Patient/family education;Orthotic Fit/Training;Taping;Manual techniques;Manual lymph drainage;Therapeutic activities;Therapeutic exercise;Scar mobilization;Passive range of motion   PT Next Visit Plan Reassess cording and shoudler range of motion and treat as indicted.  Follow up to see if prescription has been returned .  Issue Quick DASH for final goal achievement.    Consulted and Agree with Plan of Care Patient      Patient will benefit from skilled therapeutic intervention in order to improve the following deficits and impairments:  Decreased skin integrity, Impaired sensation, Decreased scar mobility, Decreased knowledge of precautions, Decreased knowledge of use of DME, Increased fascial restricitons, Decreased strength, Impaired UE functional use, Pain, Decreased range of motion  Visit Diagnosis: Aftercare following surgery for neoplasm  Acute pain of right shoulder  Stiffness of joint, shoulder region, right     Problem List Patient Active Problem List   Diagnosis Date Noted  . Malignant neoplasm of upper-outer quadrant of right breast in female, estrogen receptor positive ([place]) 08/24/2016   Donato Heinz. Owens Shark PT  Norwood Levo 10/14/2016, 12:54 PM  [place], [name], [address] Phone: [phone]   Fax:  [phone]  Name: [name] MRN: [id number] Date of Birth: [birth year]

## 2016-10-15 ENCOUNTER — Ambulatory Visit
Admission: RE | Admit: 2016-10-15 | Discharge: 2016-10-15 | Disposition: A | Discharge status: Home / Self Care | Payer: 59 | Source: Ambulatory Visit | Attending: Radiation Oncology | Admitting: Radiation Oncology

## 2016-10-15 DIAGNOSIS — C50411 Malignant neoplasm of upper-outer quadrant of right female breast: Secondary | ICD-10-CM | POA: Diagnosis not present

## 2016-10-15 DIAGNOSIS — Z510 Encounter for antineoplastic radiation therapy: Secondary | ICD-10-CM | POA: Diagnosis not present

## 2016-10-16 ENCOUNTER — Encounter: Payer: 59 | Admitting: Physical Therapy

## 2016-10-16 ENCOUNTER — Ambulatory Visit
Admission: RE | Admit: 2016-10-16 | Discharge: 2016-10-16 | Disposition: A | Discharge status: Home / Self Care | Payer: 59 | Source: Ambulatory Visit | Attending: Radiation Oncology | Admitting: Radiation Oncology

## 2016-10-16 DIAGNOSIS — C50411 Malignant neoplasm of upper-outer quadrant of right female breast: Secondary | ICD-10-CM | POA: Diagnosis not present

## 2016-10-16 DIAGNOSIS — Z510 Encounter for antineoplastic radiation therapy: Secondary | ICD-10-CM | POA: Diagnosis not present

## 2016-10-19 ENCOUNTER — Ambulatory Visit
Admission: RE | Admit: 2016-10-19 | Discharge: 2016-10-19 | Disposition: A | Discharge status: Home / Self Care | Payer: 59 | Source: Ambulatory Visit | Attending: Radiation Oncology | Admitting: Radiation Oncology

## 2016-10-19 DIAGNOSIS — C50411 Malignant neoplasm of upper-outer quadrant of right female breast: Secondary | ICD-10-CM | POA: Diagnosis not present

## 2016-10-19 DIAGNOSIS — M25511 Pain in right shoulder: Secondary | ICD-10-CM | POA: Diagnosis not present

## 2016-10-19 DIAGNOSIS — Z510 Encounter for antineoplastic radiation therapy: Secondary | ICD-10-CM | POA: Diagnosis not present

## 2016-10-19 DIAGNOSIS — M25611 Stiffness of right shoulder, not elsewhere classified: Secondary | ICD-10-CM | POA: Diagnosis not present

## 2016-10-19 DIAGNOSIS — Z483 Aftercare following surgery for neoplasm: Secondary | ICD-10-CM | POA: Diagnosis not present

## 2016-10-20 ENCOUNTER — Ambulatory Visit
Admission: RE | Admit: 2016-10-20 | Discharge: 2016-10-20 | Disposition: A | Discharge status: Home / Self Care | Payer: 59 | Source: Ambulatory Visit | Attending: Radiation Oncology | Admitting: Radiation Oncology

## 2016-10-20 DIAGNOSIS — C50411 Malignant neoplasm of upper-outer quadrant of right female breast: Secondary | ICD-10-CM | POA: Diagnosis not present

## 2016-10-20 DIAGNOSIS — Z510 Encounter for antineoplastic radiation therapy: Secondary | ICD-10-CM | POA: Diagnosis not present

## 2016-10-21 ENCOUNTER — Ambulatory Visit: Payer: 59 | Attending: Hematology

## 2016-10-21 ENCOUNTER — Ambulatory Visit
Admission: RE | Admit: 2016-10-21 | Discharge: 2016-10-21 | Disposition: A | Discharge status: Home / Self Care | Payer: 59 | Source: Ambulatory Visit | Attending: Radiation Oncology | Admitting: Radiation Oncology

## 2016-10-21 DIAGNOSIS — M25511 Pain in right shoulder: Secondary | ICD-10-CM | POA: Diagnosis present

## 2016-10-21 DIAGNOSIS — M25611 Stiffness of right shoulder, not elsewhere classified: Secondary | ICD-10-CM | POA: Diagnosis present

## 2016-10-21 DIAGNOSIS — Z510 Encounter for antineoplastic radiation therapy: Secondary | ICD-10-CM | POA: Diagnosis not present

## 2016-10-21 DIAGNOSIS — Z483 Aftercare following surgery for neoplasm: Secondary | ICD-10-CM | POA: Insufficient documentation

## 2016-10-21 DIAGNOSIS — C50411 Malignant neoplasm of upper-outer quadrant of right female breast: Secondary | ICD-10-CM | POA: Diagnosis not present

## 2016-10-21 NOTE — Therapy (Signed)
[name], [name], [address] Phone: [phone]   Fax:  [phone]  Physical Therapy Treatment  Patient Details  Name: [name] MRN: [id number] Date of Birth: 05/26/1970 Referring Provider: Dr. Burr Medico   Encounter Date: 10/21/2016      PT End of Session - 10/21/16 0933    Visit Number 8   Number of Visits 9   Date for PT Re-Evaluation 10/23/16   PT Start Time 0846   PT Stop Time 0933   PT Time Calculation (min) 47 min   Activity Tolerance Patient tolerated treatment well   Behavior During Therapy [facility] for tasks assessed/performed      Past Medical History:  Diagnosis Date  . Breast cancer ([place]) 08/2016   right  . Dental crown present     Past Surgical History:  Procedure Laterality Date  . BREAST LUMPECTOMY WITH RADIOACTIVE SEED AND SENTINEL LYMPH NODE BIOPSY Right 08/27/2016   Procedure: RIGHT BREAST LUMPECTOMY WITH RADIOACTIVE SEED AND SENTINEL LYMPH NODE BIOPSY;  Surgeon: Erroll Luna, MD;  Location: [place];  Service: General;  Laterality: Right;  . DILATION AND EVACUATION  04/08/2008; 08/01/2008    There were no vitals filed for this visit.      Subjective Assessment - 10/21/16 0849    Subjective The cord does better after therapy and I felt one "pop" and my ROM was slightly improved after that. But the rest of cording is still palpable and frustrating.    Pertinent History diagnosed in March 2108 . On April 12 right breast lumpectomy with 6 nodes removed complicated with seroma drained about 2 weeks ago  Will not have have to have chemotherapy  Will have to have radiation.  Developed a rash the past weekend with a "cord" in her armpit    Patient Stated Goals I just want to have my life back    Currently in Pain? No/denies            [facility] PT Assessment - 10/21/16 0001      AROM   Right Shoulder Flexion 157 Degrees  161 degrees after stretching   Right  Shoulder ABduction 180 Degrees                     [facility] Adult PT Treatment/Exercise - 10/21/16 0001      Manual Therapy   Manual Therapy Manual Lymphatic Drainage (MLD);Myofascial release;Neural Stretch   Myofascial Release gently myofascial release to cording in right axilla; felt and heard pop today of cord during stretching   Manual Lymphatic Drainage (MLD) short neck, diphragmatic breathing, shoulder collectors, Rt inguinal node and Rt axillo-inguinal anastomosis, upper arm to antecubital fossa to forearm with return along pathways    Neural Stretch Passive neural stretching to right UE in supine to tolerance                        [facility] Goals - 10/07/16 4315      CC Long Term Goal  #1   Title Patient with verbalize an understanding of lymphedema risk reduction precautions   Status Achieved     CC Long Term Goal  #2   Title Patient will improve right t shoulder abduction to 120 degrees so that she can achieve position needed for radiation therapy.   Baseline 180  on 10/07/2016   Status Achieved     CC Long Term Goal  #3  Title Patient will be independent in a  home exercise program for shoulder range of motion and strength   Status Achieved            Plan - 10/21/16 0934    Clinical Impression Statement Pt is continuing to do well during radiation with her skin tolerance and ROM. Her abduction and flexion both have slightly improved since last week, thouh she feels her cording issue comes and goes. We both heard and felt pop today during stretching and she had noticeable increased P/ROM after and less pain/tightness with end motions. Pt will benefit from continuing once a week to decrease cording symptoms and help pt maintain, if not gain, ROM throughout radiation.    Rehab Potential Excellent   Clinical Impairments Affecting Rehab Potential 6 nodes removed. Seroma with aspiration    PT Frequency 2x / week   PT Duration 4 weeks    PT Treatment/Interventions ADLs/Self Care Home Management;Patient/family education;Orthotic Fit/Training;Taping;Manual techniques;Manual lymph drainage;Therapeutic activities;Therapeutic exercise;Scar mobilization;Passive range of motion   PT Next Visit Plan Renewal next visit for once a week. Reassess cording and shoulder range of motion and treat as indicted.  Follow up to see if prescription has been returned .  Issue Quick DASH for final goal achievement.    Consulted and Agree with Plan of Care Patient      Patient will benefit from skilled therapeutic intervention in order to improve the following deficits and impairments:  Decreased skin integrity, Impaired sensation, Decreased scar mobility, Decreased knowledge of precautions, Decreased knowledge of use of DME, Increased fascial restricitons, Decreased strength, Impaired UE functional use, Pain, Decreased range of motion  Visit Diagnosis: Aftercare following surgery for neoplasm  Acute pain of right shoulder  Stiffness of joint, shoulder region, right     Problem List Patient Active Problem List   Diagnosis Date Noted  . Malignant neoplasm of upper-outer quadrant of right breast in female, estrogen receptor positive ([place]) 08/24/2016    [name], PTA 10/21/2016, 9:37 AM  [place], [name], [address] Phone: [phone]   Fax:  [phone]  Name: [name] MRN: [id number] Date of Birth: 05/30/1970

## 2016-10-22 ENCOUNTER — Ambulatory Visit
Admission: RE | Admit: 2016-10-22 | Discharge: 2016-10-22 | Disposition: A | Discharge status: Home / Self Care | Payer: 59 | Source: Ambulatory Visit | Attending: Radiation Oncology | Admitting: Radiation Oncology

## 2016-10-22 DIAGNOSIS — Z483 Aftercare following surgery for neoplasm: Secondary | ICD-10-CM | POA: Diagnosis not present

## 2016-10-22 DIAGNOSIS — Z510 Encounter for antineoplastic radiation therapy: Secondary | ICD-10-CM | POA: Diagnosis not present

## 2016-10-22 DIAGNOSIS — C50411 Malignant neoplasm of upper-outer quadrant of right female breast: Secondary | ICD-10-CM | POA: Diagnosis not present

## 2016-10-23 ENCOUNTER — Ambulatory Visit: Payer: 59

## 2016-10-26 ENCOUNTER — Ambulatory Visit
Admission: RE | Admit: 2016-10-26 | Discharge: 2016-10-26 | Disposition: A | Discharge status: Home / Self Care | Payer: 59 | Source: Ambulatory Visit | Attending: Radiation Oncology | Admitting: Radiation Oncology

## 2016-10-26 DIAGNOSIS — C50411 Malignant neoplasm of upper-outer quadrant of right female breast: Secondary | ICD-10-CM | POA: Diagnosis not present

## 2016-10-26 DIAGNOSIS — Z510 Encounter for antineoplastic radiation therapy: Secondary | ICD-10-CM | POA: Diagnosis not present

## 2016-10-27 ENCOUNTER — Ambulatory Visit
Admission: RE | Admit: 2016-10-27 | Discharge: 2016-10-27 | Disposition: A | Discharge status: Home / Self Care | Payer: 59 | Source: Ambulatory Visit | Attending: Radiation Oncology | Admitting: Radiation Oncology

## 2016-10-27 DIAGNOSIS — Z510 Encounter for antineoplastic radiation therapy: Secondary | ICD-10-CM | POA: Diagnosis not present

## 2016-10-27 DIAGNOSIS — C50411 Malignant neoplasm of upper-outer quadrant of right female breast: Secondary | ICD-10-CM | POA: Diagnosis not present

## 2016-10-28 ENCOUNTER — Ambulatory Visit: Payer: 59

## 2016-10-28 ENCOUNTER — Ambulatory Visit
Admission: RE | Admit: 2016-10-28 | Discharge: 2016-10-28 | Disposition: A | Discharge status: Home / Self Care | Payer: 59 | Source: Ambulatory Visit | Attending: Radiation Oncology | Admitting: Radiation Oncology

## 2016-10-28 DIAGNOSIS — Z170 Estrogen receptor positive status [ER+]: Secondary | ICD-10-CM

## 2016-10-28 DIAGNOSIS — C50411 Malignant neoplasm of upper-outer quadrant of right female breast: Secondary | ICD-10-CM

## 2016-10-28 DIAGNOSIS — Z510 Encounter for antineoplastic radiation therapy: Secondary | ICD-10-CM | POA: Diagnosis not present

## 2016-10-28 DIAGNOSIS — Z483 Aftercare following surgery for neoplasm: Secondary | ICD-10-CM | POA: Diagnosis not present

## 2016-10-28 DIAGNOSIS — M25511 Pain in right shoulder: Secondary | ICD-10-CM

## 2016-10-28 DIAGNOSIS — M25611 Stiffness of right shoulder, not elsewhere classified: Secondary | ICD-10-CM

## 2016-10-28 MED ORDER — BIAFINE EX EMUL
Freq: Once | CUTANEOUS | Status: AC
  Administered 2016-10-28: 10:00:00 via TOPICAL

## 2016-10-28 NOTE — Therapy (Signed)
[place], [name], [address] Phone: [phone]   Fax:  [phone]  Physical Therapy Treatment  Patient Details  Name: [name] MRN: [id number] Date of Birth: [birth year] Referring Provider: Dr. Burr Medico   Encounter Date: 10/28/2016      PT End of Session - 10/28/16 1045    Visit Number 9   Number of Visits 13   Date for PT Re-Evaluation 11/25/16   PT Start Time 0843   PT Stop Time 0931   PT Time Calculation (min) 48 min   Activity Tolerance Patient tolerated treatment well   Behavior During Therapy [facility] for tasks assessed/performed      Past Medical History:  Diagnosis Date  . Breast cancer ([place]) 08/2016   right  . Dental crown present     Past Surgical History:  Procedure Laterality Date  . BREAST LUMPECTOMY WITH RADIOACTIVE SEED AND SENTINEL LYMPH NODE BIOPSY Right 08/27/2016   Procedure: RIGHT BREAST LUMPECTOMY WITH RADIOACTIVE SEED AND SENTINEL LYMPH NODE BIOPSY;  Surgeon: Erroll Luna, MD;  Location: [facility];  Service: General;  Laterality: Right;  . DILATION AND EVACUATION  04/08/2008; 08/01/2008    There were no vitals filed for this visit.      Subjective Assessment - 10/28/16 0847    Subjective I wore my sleeve when I flew over the weekend and my arm did fine. I also took a cycling class and owre my sleeve and that also did fine. The only time I felt the cord again was last night after having had radiation again for the last 2 days so I think you're right, it's definietely just the radiation flaring it up. After I stretched it felt better.    Pertinent History diagnosed in March 2108 . On April 12 right breast lumpectomy with 6 nodes removed complicated with seroma drained about 2 weeks ago  Will not have have to have chemotherapy  Will have to have radiation.  Developed a rash the past weekend with a "cord" in her armpit    Patient Stated Goals I just want to  have my life back    Currently in Pain? No/denies            [facility] PT Assessment - 10/28/16 0001      AROM   Right Shoulder Flexion 165 Degrees   Right Shoulder ABduction 183 Degrees              Quick Dash - 10/28/16 0001    Open a tight or new jar Mild difficulty   Do heavy household chores (wash walls, wash floors) Mild difficulty   Carry a shopping bag or briefcase No difficulty   Wash your back No difficulty   Use a knife to cut food No difficulty   Recreational activities in which you take some force or impact through your arm, shoulder, or hand (golf, hammering, tennis) No difficulty   During the past week, to what extent has your arm, shoulder or hand problem interfered with your normal social activities with family, friends, neighbors, or groups? Not at all   During the past week, to what extent has your arm, shoulder or hand problem limited your work or other regular daily activities Not at all   Arm, shoulder, or hand pain. Mild   Tingling (pins and needles) in your arm, shoulder, or hand None   Difficulty Sleeping No difficulty   DASH Score 6.82 %  [facility] Adult PT Treatment/Exercise - 10/28/16 0001      Manual Therapy   Manual Therapy Manual Lymphatic Drainage (MLD);Myofascial release;Neural Stretch   Myofascial Release gently myofascial release to cording in right axilla; felt and heard pop today of cord during stretching   Manual Lymphatic Drainage (MLD) short neck, diphragmatic breathing, shoulder collectors, Rt inguinal node and Rt axillo-inguinal anastomosis, upper arm to antecubital fossa to forearm with return along pathways    Neural Stretch Passive neural stretching to right UE in supine to tolerance                        Long Term Clinic Goals - 10/28/16 1051      CC Long Term Goal  #1   Title Patient with verbalize an understanding of lymphedema risk reduction precautions   Status Achieved     CC Long Term  Goal  #2   Title Patient will improve right t shoulder abduction to 120 degrees so that she can achieve position needed for radiation therapy.   Status Achieved     CC Long Term Goal  #3   Title Patient will be independent in a  home exercise program for shoulder range of motion and strength   Status Achieved     CC Long Term Goal  #4   Title Patient will decrease the DASH score to < 19   to demonstrate increased functional use of upper extremity   Baseline 36.36; 6.86-6/13/18   Status Achieved            Plan - 10/28/16 1045    Clinical Impression Statement Pts A/ROM though still slightly limited at end ROM, has improved since last week. Pt would like to cont but decrease to 1x/week so we can further help her to regain end ROM while still undergoing radiation.     Rehab Potential Excellent   Clinical Impairments Affecting Rehab Potential 6 nodes removed. Seroma with aspiration    PT Frequency 1x / week   PT Duration 4 weeks   PT Treatment/Interventions ADLs/Self Care Home Management;Patient/family education;Orthotic Fit/Training;Taping;Manual techniques;Manual lymph drainage;Therapeutic activities;Therapeutic exercise;Scar mobilization;Passive range of motion   PT Next Visit Plan Renewal done for 1x/wk for 4 weeks. Cont manual therapy to focus on decreased cording and increased end ROM of Rt UE. Follow up to see if prescription has been returned.   Consulted and Agree with Plan of Care Patient      Patient will benefit from skilled therapeutic intervention in order to improve the following deficits and impairments:  Decreased skin integrity, Impaired sensation, Decreased scar mobility, Decreased knowledge of precautions, Decreased knowledge of use of DME, Increased fascial restricitons, Decreased strength, Impaired UE functional use, Pain, Decreased range of motion  Visit Diagnosis: Aftercare following surgery for neoplasm  Acute pain of right shoulder  Stiffness of joint,  shoulder region, right     Problem List Patient Active Problem List   Diagnosis Date Noted  . Malignant neoplasm of upper-outer quadrant of right breast in female, estrogen receptor positive ([place]) 08/24/2016    [name], PTA 10/28/2016, 11:03 AM  [place], [name], [address] Phone: [phone]   Fax:  [phone]  Name: [name] MRN: [id number] Date of Birth: 1971-01-15

## 2016-10-29 ENCOUNTER — Ambulatory Visit
Admission: RE | Admit: 2016-10-29 | Discharge: 2016-10-29 | Disposition: A | Discharge status: Home / Self Care | Payer: 59 | Source: Ambulatory Visit | Attending: Radiation Oncology | Admitting: Radiation Oncology

## 2016-10-29 DIAGNOSIS — C50411 Malignant neoplasm of upper-outer quadrant of right female breast: Secondary | ICD-10-CM | POA: Diagnosis not present

## 2016-10-29 DIAGNOSIS — Z510 Encounter for antineoplastic radiation therapy: Secondary | ICD-10-CM | POA: Diagnosis not present

## 2016-10-30 ENCOUNTER — Ambulatory Visit
Admission: RE | Admit: 2016-10-30 | Discharge: 2016-10-30 | Disposition: A | Discharge status: Home / Self Care | Payer: 59 | Source: Ambulatory Visit | Attending: Radiation Oncology | Admitting: Radiation Oncology

## 2016-10-30 DIAGNOSIS — C50411 Malignant neoplasm of upper-outer quadrant of right female breast: Secondary | ICD-10-CM | POA: Diagnosis not present

## 2016-10-30 DIAGNOSIS — Z510 Encounter for antineoplastic radiation therapy: Secondary | ICD-10-CM | POA: Diagnosis not present

## 2016-11-02 ENCOUNTER — Ambulatory Visit
Admission: RE | Admit: 2016-11-02 | Discharge: 2016-11-02 | Disposition: A | Discharge status: Home / Self Care | Payer: 59 | Source: Ambulatory Visit | Attending: Radiation Oncology | Admitting: Radiation Oncology

## 2016-11-02 DIAGNOSIS — C50411 Malignant neoplasm of upper-outer quadrant of right female breast: Secondary | ICD-10-CM | POA: Diagnosis not present

## 2016-11-02 DIAGNOSIS — Z510 Encounter for antineoplastic radiation therapy: Secondary | ICD-10-CM | POA: Diagnosis not present

## 2016-11-03 ENCOUNTER — Ambulatory Visit
Admission: RE | Admit: 2016-11-03 | Discharge: 2016-11-03 | Disposition: A | Discharge status: Home / Self Care | Payer: 59 | Source: Ambulatory Visit | Attending: Radiation Oncology | Admitting: Radiation Oncology

## 2016-11-03 DIAGNOSIS — C50411 Malignant neoplasm of upper-outer quadrant of right female breast: Secondary | ICD-10-CM | POA: Diagnosis not present

## 2016-11-03 DIAGNOSIS — Z510 Encounter for antineoplastic radiation therapy: Secondary | ICD-10-CM | POA: Diagnosis not present

## 2016-11-04 ENCOUNTER — Ambulatory Visit: Payer: 59

## 2016-11-04 ENCOUNTER — Ambulatory Visit
Admission: RE | Admit: 2016-11-04 | Discharge: 2016-11-04 | Disposition: A | Discharge status: Home / Self Care | Payer: 59 | Source: Ambulatory Visit | Attending: Radiation Oncology | Admitting: Radiation Oncology

## 2016-11-04 DIAGNOSIS — Z510 Encounter for antineoplastic radiation therapy: Secondary | ICD-10-CM | POA: Diagnosis not present

## 2016-11-04 DIAGNOSIS — Z483 Aftercare following surgery for neoplasm: Secondary | ICD-10-CM | POA: Diagnosis not present

## 2016-11-04 DIAGNOSIS — M25511 Pain in right shoulder: Secondary | ICD-10-CM

## 2016-11-04 DIAGNOSIS — C50411 Malignant neoplasm of upper-outer quadrant of right female breast: Secondary | ICD-10-CM | POA: Diagnosis not present

## 2016-11-04 DIAGNOSIS — Z170 Estrogen receptor positive status [ER+]: Secondary | ICD-10-CM | POA: Diagnosis not present

## 2016-11-04 DIAGNOSIS — M25611 Stiffness of right shoulder, not elsewhere classified: Secondary | ICD-10-CM

## 2016-11-04 NOTE — Therapy (Addendum)
[place], [name], [address] Phone: [phone]   Fax:  [phone]  Physical Therapy Treatment  Patient Details  Name: [name] MRN: [id number] Date of Birth: 1971-02-17 Referring Provider: Dr. Burr Medico   Encounter Date: 11/04/2016      PT End of Session - 11/04/16 0930    Visit Number 10   Number of Visits 13   Date for PT Re-Evaluation 11/25/16   PT Start Time 0846   PT Stop Time 0930   PT Time Calculation (min) 44 min   Activity Tolerance Patient tolerated treatment well   Behavior During Therapy [facility] for tasks assessed/performed      Past Medical History:  Diagnosis Date  . Breast cancer ([place]) 08/2016   right  . Dental crown present     Past Surgical History:  Procedure Laterality Date  . BREAST LUMPECTOMY WITH RADIOACTIVE SEED AND SENTINEL LYMPH NODE BIOPSY Right 08/27/2016   Procedure: RIGHT BREAST LUMPECTOMY WITH RADIOACTIVE SEED AND SENTINEL LYMPH NODE BIOPSY;  Surgeon: Erroll Luna, MD;  Location: [facility];  Service: General;  Laterality: Right;  . DILATION AND EVACUATION  04/08/2008; 08/01/2008    There were no vitals filed for this visit.      Subjective Assessment - 11/04/16 0850    Subjective I feel like today will be my last day. I'm just doing so well and I owe it all to you guys! My skin has started to get a little itchy, but other than that I'm doing well.    Pertinent History diagnosed in March 2108 . On April 12 right breast lumpectomy with 6 nodes removed complicated with seroma drained about 2 weeks ago  Will not have have to have chemotherapy  Will have to have radiation.  Developed a rash the past weekend with a "cord" in her armpit    Patient Stated Goals I just want to have my life back    Currently in Pain? No/denies            [facility] PT Assessment - 11/04/16 0001      AROM   Right Shoulder Flexion 170 Degrees   Right Shoulder  ABduction 184 Degrees   Right Shoulder Internal Rotation 87 Degrees                     [facility] Adult PT Treatment/Exercise - 11/04/16 0001      Manual Therapy   Manual Therapy Manual Lymphatic Drainage (MLD);Myofascial release;Neural Stretch   Myofascial Release Myofascial release to cording in right axilla   Passive ROM PROM right shoulder in supine to pt tolerance all planes focused on abduction and D2   Neural Stretch Passive neural stretching to right UE in supine to tolerance                        Long Term Clinic Goals - 11/04/16 0933      CC Long Term Goal  #1   Title Patient with verbalize an understanding of lymphedema risk reduction precautions   Status Achieved     CC Long Term Goal  #2   Title Patient will improve right shoulder abduction to 120 degrees so that she can achieve position needed for radiation therapy.   Baseline 180  on 10/07/2016   Status Achieved     CC Long Term Goal  #3   Title Patient will be independent in a  home exercise  program for shoulder range of motion and strength   Status Achieved     CC Long Term Goal  #4   Title Patient will decrease the DASH score to < 19   to demonstrate increased functional use of upper extremity   Baseline 36.36; 6.86-6/13/18   Status Achieved     CC Long Term Goal  #5   Title Pt will have AROM of left shoulder flexion to 170 degrees with no reports of cording tightness so that she can return all prior activities    Baseline 170 degrees attained today after stretching-11/04/16   Status Achieved            Plan - 11/04/16 0931    Clinical Impression Statement Pt has done excellent and cording is very minimally palpated at this time with end ROM stretching. Pt is very pleased with her progress at this time and all goals have been met so will D/C today.    Rehab Potential Excellent   Clinical Impairments Affecting Rehab Potential 6 nodes removed. Seroma with aspiration    PT  Frequency 1x / week   PT Duration 4 weeks   PT Treatment/Interventions ADLs/Self Care Home Management;Patient/family education;Orthotic Fit/Training;Taping;Manual techniques;Manual lymph drainage;Therapeutic activities;Therapeutic exercise;Scar mobilization;Passive range of motion   PT Next Visit Plan D/C this visit.   Consulted and Agree with Plan of Care Patient      Patient will benefit from skilled therapeutic intervention in order to improve the following deficits and impairments:  Decreased skin integrity, Impaired sensation, Decreased scar mobility, Decreased knowledge of precautions, Decreased knowledge of use of DME, Increased fascial restricitons, Decreased strength, Impaired UE functional use, Pain, Decreased range of motion  Visit Diagnosis: Aftercare following surgery for neoplasm  Acute pain of right shoulder  Stiffness of joint, shoulder region, right     Problem List Patient Active Problem List   Diagnosis Date Noted  . Malignant neoplasm of upper-outer quadrant of right breast in female, estrogen receptor positive ([place]) 08/24/2016    [name], PTA 11/04/2016, 9:34 AM   PHYSICAL THERAPY DISCHARGE SUMMARY  Visits from Start of Care: 10  Current functional level related to goals / functional outcomes: indpendent   Remaining deficits: none   Education / Equipment: Lymphedema risk reduction, home exercise Plan: Patient agrees to discharge.  Patient goals were met. Patient is being discharged due to being pleased with the current functional level.  ?????    [address], [place], [name], [address] Phone: [phone]   Fax:  [phone]  Name: [name] MRN: [id number] Date of Birth: 01-30-1971

## 2016-11-05 ENCOUNTER — Ambulatory Visit
Admission: RE | Admit: 2016-11-05 | Discharge: 2016-11-05 | Disposition: A | Discharge status: Home / Self Care | Payer: 59 | Source: Ambulatory Visit | Attending: Radiation Oncology | Admitting: Radiation Oncology

## 2016-11-05 DIAGNOSIS — Z510 Encounter for antineoplastic radiation therapy: Secondary | ICD-10-CM | POA: Diagnosis not present

## 2016-11-05 DIAGNOSIS — C50411 Malignant neoplasm of upper-outer quadrant of right female breast: Secondary | ICD-10-CM | POA: Diagnosis not present

## 2016-11-06 ENCOUNTER — Ambulatory Visit
Admission: RE | Admit: 2016-11-06 | Discharge: 2016-11-06 | Disposition: A | Discharge status: Home / Self Care | Payer: 59 | Source: Ambulatory Visit | Attending: Radiation Oncology | Admitting: Radiation Oncology

## 2016-11-06 DIAGNOSIS — C50411 Malignant neoplasm of upper-outer quadrant of right female breast: Secondary | ICD-10-CM | POA: Diagnosis not present

## 2016-11-06 DIAGNOSIS — Z510 Encounter for antineoplastic radiation therapy: Secondary | ICD-10-CM | POA: Diagnosis not present

## 2016-11-09 ENCOUNTER — Ambulatory Visit
Admission: RE | Admit: 2016-11-09 | Discharge: 2016-11-09 | Disposition: A | Discharge status: Home / Self Care | Payer: 59 | Source: Ambulatory Visit | Attending: Radiation Oncology | Admitting: Radiation Oncology

## 2016-11-09 DIAGNOSIS — Z510 Encounter for antineoplastic radiation therapy: Secondary | ICD-10-CM | POA: Diagnosis not present

## 2016-11-09 DIAGNOSIS — C50411 Malignant neoplasm of upper-outer quadrant of right female breast: Secondary | ICD-10-CM | POA: Diagnosis not present

## 2016-11-10 ENCOUNTER — Ambulatory Visit
Admission: RE | Admit: 2016-11-10 | Discharge: 2016-11-10 | Disposition: A | Discharge status: Home / Self Care | Payer: 59 | Source: Ambulatory Visit | Attending: Radiation Oncology | Admitting: Radiation Oncology

## 2016-11-10 DIAGNOSIS — Z510 Encounter for antineoplastic radiation therapy: Secondary | ICD-10-CM | POA: Diagnosis not present

## 2016-11-10 DIAGNOSIS — C50411 Malignant neoplasm of upper-outer quadrant of right female breast: Secondary | ICD-10-CM | POA: Diagnosis not present

## 2016-11-11 ENCOUNTER — Ambulatory Visit
Admission: RE | Admit: 2016-11-11 | Discharge: 2016-11-11 | Disposition: A | Discharge status: Home / Self Care | Payer: 59 | Source: Ambulatory Visit | Attending: Radiation Oncology | Admitting: Radiation Oncology

## 2016-11-11 DIAGNOSIS — C50411 Malignant neoplasm of upper-outer quadrant of right female breast: Secondary | ICD-10-CM | POA: Diagnosis not present

## 2016-11-11 DIAGNOSIS — Z510 Encounter for antineoplastic radiation therapy: Secondary | ICD-10-CM | POA: Diagnosis not present

## 2016-11-12 ENCOUNTER — Ambulatory Visit
Admission: RE | Admit: 2016-11-12 | Discharge: 2016-11-12 | Disposition: A | Discharge status: Home / Self Care | Payer: 59 | Source: Ambulatory Visit | Attending: Radiation Oncology | Admitting: Radiation Oncology

## 2016-11-12 DIAGNOSIS — Z510 Encounter for antineoplastic radiation therapy: Secondary | ICD-10-CM | POA: Diagnosis not present

## 2016-11-12 DIAGNOSIS — C50411 Malignant neoplasm of upper-outer quadrant of right female breast: Secondary | ICD-10-CM | POA: Diagnosis not present

## 2016-11-13 ENCOUNTER — Ambulatory Visit
Admission: RE | Admit: 2016-11-13 | Discharge: 2016-11-13 | Disposition: A | Discharge status: Home / Self Care | Payer: 59 | Source: Ambulatory Visit | Attending: Radiation Oncology | Admitting: Radiation Oncology

## 2016-11-13 DIAGNOSIS — Z510 Encounter for antineoplastic radiation therapy: Secondary | ICD-10-CM | POA: Diagnosis not present

## 2016-11-13 DIAGNOSIS — C50411 Malignant neoplasm of upper-outer quadrant of right female breast: Secondary | ICD-10-CM | POA: Diagnosis not present

## 2016-11-15 DIAGNOSIS — Z510 Encounter for antineoplastic radiation therapy: Secondary | ICD-10-CM | POA: Diagnosis not present

## 2016-11-15 DIAGNOSIS — C50411 Malignant neoplasm of upper-outer quadrant of right female breast: Secondary | ICD-10-CM | POA: Diagnosis not present

## 2016-11-15 DIAGNOSIS — Z170 Estrogen receptor positive status [ER+]: Secondary | ICD-10-CM | POA: Diagnosis not present

## 2016-11-16 ENCOUNTER — Ambulatory Visit: Payer: 59

## 2016-11-16 ENCOUNTER — Ambulatory Visit: Payer: 59 | Admitting: Radiation Oncology

## 2016-11-16 ENCOUNTER — Ambulatory Visit
Admission: RE | Admit: 2016-11-16 | Discharge: 2016-11-16 | Disposition: A | Discharge status: Home / Self Care | Payer: 59 | Source: Ambulatory Visit | Attending: Radiation Oncology | Admitting: Radiation Oncology

## 2016-11-16 DIAGNOSIS — Z510 Encounter for antineoplastic radiation therapy: Secondary | ICD-10-CM | POA: Diagnosis not present

## 2016-11-16 DIAGNOSIS — C50411 Malignant neoplasm of upper-outer quadrant of right female breast: Secondary | ICD-10-CM | POA: Diagnosis not present

## 2016-11-17 ENCOUNTER — Ambulatory Visit
Admission: RE | Admit: 2016-11-17 | Discharge: 2016-11-17 | Disposition: A | Discharge status: Home / Self Care | Payer: 59 | Source: Ambulatory Visit | Attending: Radiation Oncology | Admitting: Radiation Oncology

## 2016-11-17 ENCOUNTER — Ambulatory Visit: Payer: 59

## 2016-11-17 DIAGNOSIS — Z510 Encounter for antineoplastic radiation therapy: Secondary | ICD-10-CM | POA: Diagnosis not present

## 2016-11-17 DIAGNOSIS — C50411 Malignant neoplasm of upper-outer quadrant of right female breast: Secondary | ICD-10-CM | POA: Diagnosis not present

## 2016-11-19 ENCOUNTER — Ambulatory Visit
Admission: RE | Admit: 2016-11-19 | Discharge: 2016-11-19 | Disposition: A | Discharge status: Home / Self Care | Payer: 59 | Source: Ambulatory Visit | Attending: Radiation Oncology | Admitting: Radiation Oncology

## 2016-11-19 DIAGNOSIS — Z510 Encounter for antineoplastic radiation therapy: Secondary | ICD-10-CM | POA: Diagnosis not present

## 2016-11-19 DIAGNOSIS — C50411 Malignant neoplasm of upper-outer quadrant of right female breast: Secondary | ICD-10-CM | POA: Diagnosis not present

## 2016-11-19 NOTE — Progress Notes (Signed)
[name]  [phone]) [phone] [phone]  Clinic Follow Up Note   Patient Care Team: Mila Palmer, MD as PCP - General (Family Medicine) Harriette Bouillon, MD as Consulting Physician (General Surgery) Dorothy Puffer, MD as Consulting Physician (Radiation Oncology) Malachy Mood, MD as Consulting Physician (Hematology) 11/23/2016    CHIEF COMPLAINTS:  Follow up Right Breast Invasive Lobular Carcinoma, ER/PR Positive, Her2 Negative, stage IA  Oncology History   Cancer Staging Malignant neoplasm of upper-outer quadrant of right breast in female, estrogen receptor positive ([name]) Staging form: Breast, AJCC 8th Edition - Pathologic stage from 08/27/2016: Stage IA (pT1a(m), pN0, cM0, G1, ER: Positive, PR: Positive, HER2: Negative) - Signed by Malachy Mood, MD on 09/09/2016 - Pathologic: No stage assigned - Unsigned       Malignant neoplasm of upper-outer quadrant of right breast in female, estrogen receptor positive ([name])   08/05/2016 Mammogram    Targeted ultrasound of the right breast was performed demonstrating an irregular hypoechoic mass at 12:30, 5 cm from the nipple measuring 6 x 4 x 7 mm which is thought to correspond to the area of distortion seen mammographically. Targeted ultrasound of the right axilla demonstrates no suspicious appearing axillary lymph nodes.      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      08/24/2016 Initial Diagnosis    Malignant neoplasm of upper-outer quadrant of right breast in female, estrogen receptor positive ([name])     08/27/2016 Surgery    Right lumpectomy and SLN biopsy, Dr. Luisa Hart       08/27/2016 Pathology Results    Right lumpectomy showed ILC  and LCIS, G1, final margins are negative, all 6 nodes are negative       10/06/2016 -  Radiation Therapy    With Dr.Moody. Final treatment 11/24/2016        HISTORY OF PRESENTING ILLNESS (09/10/16):  [name] 46 y.o. female is here because of a recent diagnosis of right breast cancer.  She presents to the clinic today and was referred by Dr. Luisa Hart. This was discovered by Screening mammogram. No abnormal feelings during the finding. She has been yearly to get mammogram. Patient feels the surgery went well. She reports a mild seroma that was drained earlier this week by Dr. Luisa Hart. She is having nerve pain on right arm. She is almost able to raise right arm all the way. Considering Physical Therapy for arm. Stopped Vicodin for pain and instead was using 3 Advil 3 gtimes a day. Have not taken Xanax since biopsy "only as emergency".   She reports she stopped taking birth control and she went Perimenopausal. She started taking Soy estrogen to alleviate  symptoms. She stopped taking this 3 weeks ago and so far no symptoms of Perimenopausal coming back. She feels she is handling things well.   On 4/26 her BP was high but she reports it is normally in range at home. No history of cancer in family but has history of other chronic diseases. Has a job that is stressful but manages it well. She exercises several times a week. PCP Is Dr. Johnn Hai. The patient  reports she has not seen her PCP recently for routine lab work. Has a gynecologist Dr. Julien Girt.   GYN HISTORY  Menarchal: Not reported LMP: a few years ago  Contraceptive: stopped and went perimenopausal HRT: No GP: G2P0, She had one miscarriage and one abortion due to no fetal heart beat.    CURRENT TREATMENT: Starting Tamoxifen in 2-3 weeks  INTERIM HISTORY: Today, she presents to the clinic for follow up. Her final radiation treatment is tomorrow. She notices a change in her breast texture. She denies any problems with radiation  other than little breast tenderness. She has been concerned about an area on her back. She has anxiety about it possibly being cancerous.     MEDICAL HISTORY:  Past Medical History:  Diagnosis Date  . Breast cancer ([name]) 08/2016   right  . Dental crown present     SURGICAL HISTORY: Past Surgical History:  Procedure Laterality Date  . BREAST LUMPECTOMY WITH RADIOACTIVE SEED AND SENTINEL LYMPH NODE BIOPSY Right 08/27/2016   Procedure: RIGHT BREAST LUMPECTOMY WITH RADIOACTIVE SEED AND SENTINEL LYMPH NODE BIOPSY;  Surgeon: Erroll Luna, MD;  Location: [facility];  Service: General;  Laterality: Right;  . DILATION AND EVACUATION  04/08/2008; 08/01/2008    SOCIAL HISTORY: Social History   Social History  . Marital status: Divorced    Spouse name: N/A  . Number of children: N/A  . Years of education: N/A   Occupational History  . Not on file.   Social History Main Topics  . Smoking status: Never Smoker  . Smokeless tobacco: Never Used  . Alcohol use 0.6 - 1.2 oz/week    1 - 2 Glasses of wine per week     Comment: 3 x/week  . Drug use: No  . Sexual activity: Not on file   Other Topics Concern  . Not on file   Social History Narrative  . No narrative on file    FAMILY HISTORY: Family History  Problem Relation Age of Onset  . Diabetes Mellitus II Father     ALLERGIES:  is allergic to erythromycin.  MEDICATIONS:  Current Outpatient Prescriptions  Medication Sig Dispense Refill  . ALPRAZolam (XANAX) 0.25 MG tablet Take 0.25 mg by mouth at bedtime as needed for anxiety.    . Emollient (AVEENO ACTIVE NAT SKIN RELIEF) CREA Apply topically.    Marland Kitchen emollient (BIAFINE) cream Apply 1 application topically 2 (two) times daily.    Marland Kitchen FIBER ADULT GUMMIES PO Take by mouth daily.     . Melatonin 1 MG TABS Take 1 tablet by mouth daily as needed.    . Multiple Vitamin (MULTIVITAMIN) tablet Take 1 tablet by mouth daily.    . non-metallic deodorant Jethro Poling) MISC Apply 1  application topically daily.    . vitamin C (ASCORBIC ACID) 500 MG tablet Take 500 mg by mouth daily.    . diphenhydrAMINE (BENADRYL) 25 MG tablet Take 25 mg by mouth every 8 (eight) hours as needed for itching or allergies.    . fexofenadine (ALLEGRA) 180 MG tablet Take 180 mg by mouth daily.    . hyaluronate sodium (RADIAPLEXRX) GEL Apply 1 application topically 2 (two) times daily.    Marland Kitchen ibuprofen (ADVIL,MOTRIN) 200 MG tablet Take 300 mg by mouth 3 (three) times daily.    . tamoxifen (NOLVADEX) 20 MG tablet Take 1 tablet (20 mg total) by mouth daily. 30 tablet 2   No current facility-administered medications for this visit.     REVIEW  OF SYSTEMS:  Constitutional: Denies fevers, chills or abnormal night sweats (+) swelling Eyes: Denies blurriness of vision, double vision or watery eyes Ears, nose, mouth, throat, and face: Denies mucositis or sore throat Respiratory: Denies cough, dyspnea or wheezes Cardiovascular: Denies palpitation, chest discomfort or lower extremity swelling Gastrointestinal:  Denies nausea, heartburn or change in bowel habits Skin: Denies abnormal skin rashes Lymphatics: Denies new lymphadenopathy or easy bruising Neurological:Denies numbness, tingling or new weaknesses Behavioral/Psych: Mood is stable, no new changes  Musculosketaton:  All other systems were reviewed with the patient and are negative.  PHYSICAL EXAMINATION:  ECOG PERFORMANCE STATUS: 0 - Asymptomatic  Vitals:   11/23/16 0848  BP: 128/61  Pulse: 63  Resp: 18  Temp: 98.8 F (37.1 C)   Filed Weights   11/23/16 0848  Weight: 144 lb 1.6 oz (65.4 kg)    GENERAL:alert, no distress and comfortable SKIN: skin color, texture, turgor are normal, no rashes or significant lesions EYES: normal, conjunctiva are pink and non-injected, sclera clear OROPHARYNX:no exudate, no erythema and lips, buccal mucosa, and tongue normal  NECK: supple, thyroid normal size, non-tender, without nodularity LYMPH:   no palpable lymphadenopathy in the cervical, axillary or inguinal LUNGS: clear to auscultation and percussion with normal breathing effort HEART: regular rate & rhythm and no murmurs and no lower extremity edema ABDOMEN:abdomen soft, non-tender and normal bowel sounds Musculoskeletal:no cyanosis of digits and no clubbing  PSYCH: alert & oriented x 3 with fluent speech NEURO: no focal motor/sensory deficits Breast: surgical excision in right upper mid-breast is healed well, diffuse skin erythema of the right breast secondary to radiation, no blistering or skin ulcers. Palpitation of the post breast and axilla showed no mass or adenopathy.  LABORATORY DATA:  I have reviewed the data as listed CBC Latest Ref Rng & Units 10/09/2016 08/01/2008 04/08/2008  WBC 3.9 - 10.3 10e3/uL 7.0 8.5 12.7(H)  Hemoglobin 11.6 - 15.9 g/dL 14.3 14.2 12.0  Hematocrit 34.8 - 46.6 % 41.1 41.5 33.8(L)  Platelets 145 - 400 10e3/uL 248 212 223   CMP Latest Ref Rng & Units 10/09/2016  Glucose 70 - 140 mg/dl 54(L)  BUN 7.0 - 26.0 mg/dL 13.5  Creatinine 0.6 - 1.1 mg/dL 0.9  Sodium 136 - 145 mEq/L 140  Potassium 3.5 - 5.1 mEq/L 4.5  CO2 22 - 29 mEq/L 27  Calcium 8.4 - 10.4 mg/dL 9.5  Total Protein 6.4 - 8.3 g/dL 6.8  Total Bilirubin 0.20 - 1.20 mg/dL 1.15  Alkaline Phos 40 - 150 U/L 56  AST 5 - 34 U/L 18  ALT 0 - 55 U/L 12    Surgical Pathology  08/27/16 1. Breast, lumpectomy, Right - INVASIVE LOBULAR CARCINOMA, GRADE I/III, TWO FOCI SPANNING 0.5 CM AND A SEPARATE MICROSCOPIC FOCUS. - LOBULAR CARCINOMA IN SITU. - THE SURGICAL RESECTION MARGINS ARE NEGATIVE FOR INVASIVE CARCINOMA. - SEE ONCOLOGY TABLE BELOW. 2. Breast, excision, Right new medial margin - BENIGN BREAST PARENCHYMA. - THERE IS NO EVIDENCE OF MALIGNANCY. - SEE COMMENT. 3. Lymph node, sentinel, biopsy, Right axillary - THERE IS NO EVIDENCE OF CARCINOMA IN 1 OF 1 LYMPH NODE (0/1). - SEE COMMENT. 4. Lymph node, sentinel, biopsy, Right axillary -  THERE IS NO EVIDENCE OF CARCINOMA IN 1 OF 1 LYMPH NODE (0/1). - SEE COMMENT. 5. Lymph node, sentinel, biopsy, Right axillary - THERE IS NO EVIDENCE OF CARCINOMA IN 1 OF 1 LYMPH NODE (0/1). - SEE COMMENT. 6. Lymph node, sentinel, biopsy, Right axillary - THERE IS NO EVIDENCE  OF CARCINOMA IN 1 OF 1 LYMPH NODE (0/1). - SEE COMMENT. 7. Lymph node, sentinel, biopsy, Right axillary - THERE IS NO EVIDENCE OF CARCINOMA IN 1 OF 1 LYMPH NODE (0/1). - SEE COMMENT. 8. Lymph node, sentinel, biopsy, Right axillary - THERE IS NO EVIDENCE OF CARCINOMA IN 1 OF 1 LYMPH NODE (0/1). - SEE COMMENT.  08/06/16 Breast, right, needle core biopsy, 12:30 o'clock - INVASIVE MAMMARY CARCINOMA, SEE COMMENT. - LOBULAR NEOPLASIA (ATYPICAL LOBULAR HYPERPLASIA).  RADIOGRAPHIC STUDIES: I have personally reviewed the radiological images as listed and agreed with the findings in the report. No results found.  ASSESSMENT & PLAN: [name] is a 46 y.o. premenopausal female with a recent diagnosis of Stage cT1bN0M0 invasive lobular carcinoma of the UOQ of right breast, ER/PR positive, Her2 negative, Ki67 10%, Grade I.    1. Malignant new pleasant of upper-outer quadrant of right breast, stage IA (pT1bN0M0), Invasive Lobular Carcinoma, ER/PR Positive, Her2 Negative, Grade I  -Her neoplasm was discovered initially by routine screening mammography on 08/05/16. - We reviewed the surgical pathology from 08/27/16, as well as staging and biology of the tumor. Pathology revealed ER/PR positive, Her2 negative, Ki67 10%. Surgical margins were negative. 6 lymph nodes were all negative.  -We discussed the risk of cancer recurrence after her complete surgical resection. Given the very early stage disease, and low-grade tumor, her risk of recurrence is likely low. - In light of the patient's very early stage and low grade, she does not require adjuvant chemo treatment. I do not think she needs Oncotype Dx test. ---Giving her strongly  ER and PR positivity of the tumor cells, and premenopausal status, I previously adjuvant endocrine therapy with tamoxifen to reduce her risk of recurrence, especially distant recurrence. The potential side effects, which includes but not limited to, hot flash, skin and vaginal dryness, slightly increased risk of cardiovascular disease and cataract, muscular stiffness and arthralgia, small risk of thrombosis and endometrial cancer from Tamoxifen, were discussed with her in great details. Preventive strategies for thrombosis, such as being physically active, using compression stocks, avoid cigarette smoking, etc., were reviewed with her. She voiced good understanding, and agrees to proceed.  -She is finishing her radiation tomorrow, we'll start tamoxifen in 2-3 weeks. -Continue breast cancer surveillance. I encouraged her to do self exam, and we'll see her routinely for follow-up and exam. She'll continue mammogram once a year.  2. Upper Right Arm Pain associated with Surgery  -Much improved  3. Back muscular pain  -She has noticed right-sided mid back pain, exam was unremarkable, I think is likely muscular pain. - I encouraged her to use a heating pad and take Tylenol or Ibruprofen - if it does not resolve in the next 2-3 weeks, she will give me a call  PLAN: - start tamoxifen in 2-3 weeks - survivorship clinic in 2 months with Ria Comment - f/u with lab in 4 months    No orders of the defined types were placed in this encounter.  I spent 30 minutes for this visit, more than 50% face-to-face counseling.  This document serves as a record of services personally performed by Truitt Merle, MD. It was created on her behalf by Maryla Morrow, a trained medical scribe. The creation of this record is based on the scribe's personal observations and the provider's statements to them. This document has been checked and approved by the attending provider.      Truitt Merle, MD 11/23/2016

## 2016-11-20 ENCOUNTER — Ambulatory Visit: Payer: 59

## 2016-11-20 ENCOUNTER — Ambulatory Visit
Admission: RE | Admit: 2016-11-20 | Discharge: 2016-11-20 | Disposition: A | Discharge status: Home / Self Care | Payer: 59 | Source: Ambulatory Visit | Attending: Radiation Oncology | Admitting: Radiation Oncology

## 2016-11-20 DIAGNOSIS — Z510 Encounter for antineoplastic radiation therapy: Secondary | ICD-10-CM | POA: Diagnosis not present

## 2016-11-23 ENCOUNTER — Telehealth: Payer: Self-pay | Admitting: Hematology

## 2016-11-23 ENCOUNTER — Ambulatory Visit: Payer: 59

## 2016-11-23 ENCOUNTER — Ambulatory Visit (HOSPITAL_BASED_OUTPATIENT_CLINIC_OR_DEPARTMENT_OTHER): Payer: 59 | Admitting: Hematology

## 2016-11-23 ENCOUNTER — Ambulatory Visit
Admission: RE | Admit: 2016-11-23 | Discharge: 2016-11-23 | Disposition: A | Discharge status: Home / Self Care | Payer: 59 | Source: Ambulatory Visit | Attending: Radiation Oncology | Admitting: Radiation Oncology

## 2016-11-23 ENCOUNTER — Encounter: Payer: Self-pay | Admitting: Hematology

## 2016-11-23 VITALS — BP 128/61 | HR 63 | Temp 98.8°F | Resp 18 | Ht 67.0 in | Wt 144.1 lb

## 2016-11-23 DIAGNOSIS — Z79899 Other long term (current) drug therapy: Secondary | ICD-10-CM | POA: Insufficient documentation

## 2016-11-23 DIAGNOSIS — C50411 Malignant neoplasm of upper-outer quadrant of right female breast: Secondary | ICD-10-CM

## 2016-11-23 DIAGNOSIS — C50911 Malignant neoplasm of unspecified site of right female breast: Secondary | ICD-10-CM | POA: Insufficient documentation

## 2016-11-23 DIAGNOSIS — Z510 Encounter for antineoplastic radiation therapy: Secondary | ICD-10-CM | POA: Insufficient documentation

## 2016-11-23 DIAGNOSIS — Z888 Allergy status to other drugs, medicaments and biological substances status: Secondary | ICD-10-CM | POA: Insufficient documentation

## 2016-11-23 DIAGNOSIS — Z170 Estrogen receptor positive status [ER+]: Secondary | ICD-10-CM

## 2016-11-23 MED ORDER — TAMOXIFEN CITRATE 20 MG PO TABS: 20.0000 mg | ORAL_TABLET | Freq: Every day | ORAL | 2 refills | Status: DC

## 2016-11-23 NOTE — Telephone Encounter (Signed)
Scheduled appt per 7/9 los - patient is aware - sent reminder letter in the mail with appt date and time.

## 2016-11-24 ENCOUNTER — Ambulatory Visit
Admission: RE | Admit: 2016-11-24 | Discharge: 2016-11-24 | Disposition: A | Discharge status: Home / Self Care | Payer: 59 | Source: Ambulatory Visit | Attending: Radiation Oncology | Admitting: Radiation Oncology

## 2016-11-24 ENCOUNTER — Encounter: Payer: Self-pay | Admitting: Radiation Oncology

## 2016-11-24 DIAGNOSIS — Z510 Encounter for antineoplastic radiation therapy: Secondary | ICD-10-CM | POA: Diagnosis not present

## 2016-11-24 DIAGNOSIS — C50411 Malignant neoplasm of upper-outer quadrant of right female breast: Secondary | ICD-10-CM | POA: Diagnosis not present

## 2016-12-09 NOTE — Progress Notes (Signed)
°  Radiation Oncology         [phone] ________________________________  Name: [name] MRN: [id number]  Date: 11/24/2016  DOB: 1970/09/07  End of Treatment Note  Diagnosis:   Stage IA, mpT1aN0Mx Grade 1, ER/PR positive invasive lobular carcinoma of the right breast.     Indication for treatment:  Curative       Radiation treatment dates:   10/06/2016 to 11/24/2016  Site/dose:    1. The Right breast was treated to 50.4 Gy in 28 fractions at 1.8 Gy per fraction. 2. The Right breast was boosted to 10 Gy in 5 fractions at 2 Gy per fraction.   Beams/energy:    1. 3D // 6X 2. En face electron // 9E  Narrative: The patient tolerated radiation treatment relatively well.   She developed diffuse dermatitis in the treatment area. Using biafine and cortisone cream.   Plan: The patient has completed radiation treatment. The patient will return to radiation oncology clinic for routine followup in one month. I advised them to call or return sooner if they have any questions or concerns related to their recovery or treatment.  ------------------------------------------------  Jodelle Gross, MD, PhD  This document serves as a record of services personally performed by Kyung Rudd, MD. It was created on his behalf by Arlyce Harman, a trained medical scribe. The creation of this record is based on the scribe's personal observations and the provider's statements to them. This document has been checked and approved by the attending provider.

## 2017-01-13 ENCOUNTER — Encounter: Payer: Self-pay | Admitting: Radiation Oncology

## 2017-01-13 ENCOUNTER — Ambulatory Visit
Admission: RE | Admit: 2017-01-13 | Discharge: 2017-01-13 | Disposition: A | Discharge status: Home / Self Care | Payer: 59 | Source: Ambulatory Visit | Attending: Radiation Oncology | Admitting: Radiation Oncology

## 2017-01-13 VITALS — BP 111/75 | HR 63 | Temp 98.9°F | Resp 20 | Wt 140.4 lb

## 2017-01-13 DIAGNOSIS — Z510 Encounter for antineoplastic radiation therapy: Secondary | ICD-10-CM | POA: Diagnosis not present

## 2017-01-13 DIAGNOSIS — C50411 Malignant neoplasm of upper-outer quadrant of right female breast: Secondary | ICD-10-CM

## 2017-01-13 DIAGNOSIS — Z170 Estrogen receptor positive status [ER+]: Secondary | ICD-10-CM

## 2017-01-13 NOTE — Progress Notes (Signed)
Radiation Oncology         [phone] ________________________________  Name: [name] MRN: [id number]  Date of Service: 01/13/2017  DOB: March 10, 1971  Post Treatment Note  CC: Jonathon Jordan, MD  Truitt Merle, MD  Diagnosis:  Stage IA, pT1aN0Mx Grade 1, ER/PR positive invasive lobular carcinoma of the right breast.   Interval Since Last Radiation:  7 weeks   10/06/2016 to 11/24/2016: 1. The Right breast was treated to 50.4 Gy in 28 fractions at 1.8 Gy per fraction. 2. The Right breast was boosted to 10 Gy in 5 fractions at 2 Gy per fraction.   Narrative:  The patient returns today for routine follow-up. During treatment she did very well with radiotherapy and did not have significant desquamation.                             On review of systems, the patient states she's doing well. She continues to work on her range of motion. She's continuing to be very active and exercises regularly. She reports occasional itching of her right breast and uses hydrocortisone containing cream. She reports that she does not more heat in the breast.   ALLERGIES:  is allergic to erythromycin.  Meds: Current Outpatient Prescriptions  Medication Sig Dispense Refill  . diphenhydrAMINE (BENADRYL) 25 MG tablet Take 25 mg by mouth every 8 (eight) hours as needed for itching or allergies.    . Emollient (AVEENO ACTIVE NAT SKIN RELIEF) CREA Apply topically.    . fexofenadine (ALLEGRA) 180 MG tablet Take 180 mg by mouth daily.    Marland Kitchen FIBER ADULT GUMMIES PO Take by mouth daily.     . Melatonin 1 MG TABS Take 1 tablet by mouth daily as needed.    . Multiple Vitamin (MULTIVITAMIN) tablet Take 1 tablet by mouth daily.    . tamoxifen (NOLVADEX) 20 MG tablet Take 1 tablet (20 mg total) by mouth daily. 30 tablet 2  . vitamin C (ASCORBIC ACID) 500 MG tablet Take 500 mg by mouth daily.     No current facility-administered medications for this encounter.     Physical Findings:  weight is 140 lb 6 oz (63.7 kg).  Her oral temperature is 98.9 F (37.2 C). Her blood pressure is 111/75 and her pulse is 63. Her respiration is 20 and oxygen saturation is 100%.  Pain Assessment Pain Score: 0-No pain/10 In general this is a well appearing caucasian female in no acute distress. She's alert and oriented x4 and appropriate throughout the examination. Cardiopulmonary assessment is negative for acute distress and she exhibits normal effort. The right breast was examined and reveals mild hyperpigmentation with minimal nipple crusting. No desquamation is otherwise noted.   Lab Findings: Lab Results  Component Value Date   WBC 7.0 10/09/2016   HGB 14.3 10/09/2016   HCT 41.1 10/09/2016   MCV 95.6 10/09/2016   PLT 248 10/09/2016     Radiographic Findings: No results found.  Impression/Plan: 1. Stage IA, pT1aN0Mx Grade 1, ER/PR positive invasive lobular carcinoma of the right breast. The patient has been doing well since completion of radiotherapy. We discussed that we would be happy to continue to follow her as needed, but she will also continue to follow up with Dr. Burr Medico in medical oncology. She was counseled on skin care as well as measures to avoid sun exposure to this area.  2. Survivorship. The patient will meet with survivorship clinic next month  and was encouraged to continue moving toward this process.      Carola Rhine, PAC

## 2017-01-25 ENCOUNTER — Encounter: Payer: 59 | Admitting: Adult Health

## 2017-02-10 ENCOUNTER — Telehealth: Payer: Self-pay

## 2017-02-10 NOTE — Telephone Encounter (Signed)
Called patient to confirm SCP visit appt on 02/12/17.  Patient states she will be here.

## 2017-02-12 ENCOUNTER — Encounter: Payer: Self-pay | Admitting: Adult Health

## 2017-02-12 ENCOUNTER — Ambulatory Visit (HOSPITAL_BASED_OUTPATIENT_CLINIC_OR_DEPARTMENT_OTHER): Payer: 59 | Admitting: Adult Health

## 2017-02-12 VITALS — BP 140/78 | HR 52 | Temp 98.1°F | Resp 18 | Ht 67.0 in | Wt 136.0 lb

## 2017-02-12 DIAGNOSIS — Z79810 Long term (current) use of selective estrogen receptor modulators (SERMs): Secondary | ICD-10-CM | POA: Diagnosis not present

## 2017-02-12 DIAGNOSIS — C50411 Malignant neoplasm of upper-outer quadrant of right female breast: Secondary | ICD-10-CM

## 2017-02-12 DIAGNOSIS — Z170 Estrogen receptor positive status [ER+]: Secondary | ICD-10-CM

## 2017-02-12 NOTE — Progress Notes (Signed)
CLINIC:  Survivorship   REASON FOR VISIT:  Routine follow-up post-treatment for a recent history of breast cancer.  BRIEF ONCOLOGIC HISTORY:  Oncology History   Cancer Staging Malignant neoplasm of upper-outer quadrant of right breast in female, estrogen receptor positive ([place]) Staging form: Breast, AJCC 8th Edition - Pathologic stage from 08/27/2016: Stage IA (pT1a(m), pN0, cM0, G1, ER: Positive, PR: Positive, HER2: Negative) - Signed by Truitt Merle, MD on 09/09/2016 - Pathologic: No stage assigned - Unsigned       Malignant neoplasm of upper-outer quadrant of right breast in female, estrogen receptor positive ([place])   08/05/2016 Mammogram    Targeted ultrasound of the right breast was performed demonstrating an irregular hypoechoic mass at 12:30, 5 cm from the nipple measuring 6 x 4 x 7 mm which is thought to correspond to the area of distortion seen mammographically. Targeted ultrasound of the right axilla demonstrates no suspicious appearing axillary lymph nodes.      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      08/24/2016 Initial Diagnosis    Malignant neoplasm of upper-outer quadrant of right breast in female, estrogen receptor positive ([place])     08/27/2016 Surgery    Right lumpectomy and SLN biopsy, Dr. Brantley Stage       08/27/2016 Pathology Results    Right lumpectomy showed Starr and LCIS, G1, final margins are negative, all 6 nodes are negative       10/06/2016 - 11/24/2016 Radiation Therapy    With Dr.Moody. 1. The Right breast was treated to 50.4 Gy in 28 fractions at 1.8 Gy per fraction. 2. The Right breast was boosted to 10 Gy in 5 fractions at 2 Gy per fraction.       12/2016 -  Anti-estrogen oral  therapy    Tamoxifen daily       INTERVAL HISTORY:  [name] presents to the [facility] today for our initial meeting to review her survivorship care plan detailing her treatment course for breast cancer, as well as monitoring long-term side effects of that treatment, education regarding health maintenance, screening, and overall wellness and health promotion.     Overall, [name] reports feeling quite well today.  She is taking Tamoxifen daily.  She is tolerating it moderately well.  She doe shave some hot flashes.  She takes xanax and melatonin at night and is able to sleep well.  She is stressed also due to a recent move.      REVIEW OF SYSTEMS:  Review of Systems  Constitutional: Negative for appetite change, chills, fatigue, fever and unexpected weight change.  HENT:   Negative for hearing loss and lump/mass.   Eyes: Negative for eye problems and icterus.  Respiratory: Negative for chest tightness, cough and shortness of breath.   Cardiovascular: Negative for chest pain, leg swelling and palpitations.  Gastrointestinal: Negative for abdominal distention, abdominal pain, constipation, diarrhea, nausea and vomiting.  Endocrine: Positive for hot flashes.  Musculoskeletal: Negative for arthralgias.  Skin: Negative for itching and rash.  Neurological: Negative for dizziness, extremity weakness, headaches and numbness.  Psychiatric/Behavioral: Negative for depression and sleep disturbance. The  patient is nervous/anxious.    Breast: Denies any new nodularity, masses, tenderness, nipple changes, or nipple discharge.      ONCOLOGY TREATMENT TEAM:  1. Surgeon:  Dr. Brantley Stage at [facility] 2. Medical Oncologist: Dr. Burr Medico  3. Radiation Oncologist: Dr. Lisbeth Renshaw    PAST MEDICAL/SURGICAL HISTORY:  Past Medical History:  Diagnosis Date  . Breast cancer ([place]) 08/2016   right  . Dental crown present    Past Surgical History:  Procedure Laterality Date  .  BREAST LUMPECTOMY WITH RADIOACTIVE SEED AND SENTINEL LYMPH NODE BIOPSY Right 08/27/2016   Procedure: RIGHT BREAST LUMPECTOMY WITH RADIOACTIVE SEED AND SENTINEL LYMPH NODE BIOPSY;  Surgeon: Erroll Luna, MD;  Location: [facility];  Service: General;  Laterality: Right;  . DILATION AND EVACUATION  04/08/2008; 08/01/2008     ALLERGIES:  Allergies  Allergen Reactions  . Erythromycin Nausea And Vomiting     CURRENT MEDICATIONS:  Outpatient Encounter Prescriptions as of 02/12/2017  Medication Sig  . ALPRAZolam (XANAX) 0.5 MG tablet Take 0.5 mg by mouth at bedtime as needed for anxiety.  . Cyanocobalamin (B-12 PO) Take 1 Dose by mouth daily.  . Emollient (AVEENO ACTIVE NAT SKIN RELIEF) CREA Apply topically.  . fexofenadine (ALLEGRA) 180 MG tablet Take 180 mg by mouth daily.  Marland Kitchen FIBER ADULT GUMMIES PO Take by mouth daily.   . Melatonin 3 MG TABS Take 1 tablet by mouth daily as needed.  . Multiple Vitamin (MULTIVITAMIN) tablet Take 1 tablet by mouth daily.  . tamoxifen (NOLVADEX) 20 MG tablet Take 1 tablet (20 mg total) by mouth daily.  . vitamin C (ASCORBIC ACID) 500 MG tablet Take 500 mg by mouth daily.  . [DISCONTINUED] diphenhydrAMINE (BENADRYL) 25 MG tablet Take 25 mg by mouth every 8 (eight) hours as needed for itching or allergies.   No facility-administered encounter medications on file as of 02/12/2017.      ONCOLOGIC FAMILY HISTORY:  Family History  Problem Relation Age of Onset  . Diabetes Mellitus II Father      GENETIC COUNSELING/TESTING: Not indicated at this time  SOCIAL HISTORY:  [name] is single and lives with her significant other in [place], [place].  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  She denies any current or history of tobacco, alcohol, or illicit drug use.     PHYSICAL EXAMINATION:  Vital Signs:   Vitals:   02/12/17 1343  BP: 140/78    Pulse: (!) 52  Resp: 18  Temp: 98.1 F (36.7 C)  SpO2: 100%   Filed Weights   02/12/17 1343  Weight: 136 lb (61.7 kg)   General: Well-nourished, well-appearing female in no acute distress.  She is unaccompanied today.   HEENT: Head is normocephalic.  Pupils equal and reactive to light. Conjunctivae clear without exudate.  Sclerae anicteric. Oral mucosa is pink, moist.  Oropharynx is pink without lesions or erythema.  Lymph: No cervical, supraclavicular, or infraclavicular lymphadenopathy noted on palpation.  Cardiovascular: Regular rate and rhythm.Marland Kitchen Respiratory: Clear to auscultation bilaterally. Chest expansion symmetric; breathing non-labored.  GI: Abdomen soft and round; non-tender, non-distended. Bowel sounds normoactive.  GU: Deferred.  Neuro: No focal deficits. Steady gait.  Psych: Mood and affect normal and appropriate for situation.  Extremities: No edema. MSK: No focal spinal tenderness to palpation.  Full range of motion in bilateral upper extremities Skin: Warm and dry.  LABORATORY DATA:  None for this visit.  DIAGNOSTIC IMAGING:  None for this visit.      ASSESSMENT AND PLAN:  [name] is a pleasant 46 y.o. female with Stage IA right breast invasive ductal carcinoma, ER+/PR+/HER2-, diagnosed in 07/2016, treated with lumpectomy, adjuvant radiation therapy, and anti-estrogen therapy with Tamoxifen beginning in August, 2018.  She presents to the Survivorship Clinic for our initial meeting and routine follow-up post-completion of treatment for breast cancer.    1. Stage IA right breast cancer:  [name] is continuing to recover from definitive treatment for breast cancer. She will follow-up with her medical oncologist, Dr. Burr Medico in 03/2017 with history and physical exam per surveillance protocol.  She will continue her anti-estrogen therapy with Tamoxifen. Thus far, she is tolerating the Tamoxifen well, with minimal side effects (see #2). She was instructed to make Dr.  Burr Medico or myself aware if she begins to experience any worsening side effects of the medication and I could see her back in clinic to help manage those side effects, as needed. Today, a comprehensive survivorship care plan and treatment summary was reviewed with the patient today detailing her breast cancer diagnosis, treatment course, potential late/long-term effects of treatment, appropriate follow-up care with recommendations for the future, and patient education resources.  A copy of this summary, along with a letter will be sent to the patient's primary care provider via mail/fax/In Basket message after today's visit.    2. Hot flashes: I reviewed non pharmacologic interventions such as avoiding caffeine, hot/spicy foods, ETOH, and use of a cooling pillow at night to help alleviate her hot flashes.  Should they become more severe, we can always prescribe Gabapentin or Effexor.  She is not at that point yet.    3. Bone health:  Given [name]'s history of breast cancer she is at slight risk for bone demineralization.  She was given education on specific activities to promote bone health.  4. Cancer screening:  Due to [name]'s history and her age, she should receive screening for skin cancers, colon cancer, and gynecologic cancers.  The information and recommendations are listed on the patient's comprehensive care plan/treatment summary and were reviewed in detail with the patient.    5. Health maintenance and wellness promotion: [name] was encouraged to consume 5-7 servings of fruits and vegetables per day. We reviewed the "Nutrition Rainbow" handout, as well as the handout "Take Control of Your Health and Reduce Your Cancer Risk" from the [place].  She was also encouraged to engage in moderate to vigorous exercise for 30 minutes per day most days of the week. We discussed the [employer] fitness program, which is designed for cancer survivors to help them become more  physically fit after cancer treatments.  She was instructed to limit her alcohol consumption and continue to abstain from tobacco use.     6. Support services/counseling: It is not uncommon for this period of the patient's cancer care trajectory to be one of many emotions and stressors.  We discussed an opportunity for her to participate in the next session of [facility] ("Finding Your New Normal") support group series designed for patients after they have completed treatment.   [name] was encouraged to take advantage of our many other support services programs, support groups, and/or counseling in coping with her new life as a cancer survivor after completing anti-cancer treatment.  She was offered support today through active listening and expressive supportive counseling.  She was given information regarding  our available services and encouraged to contact me with any questions or for help enrolling in any of our support group/programs.    Dispo:   -Return to cancer center in 03/2017 for follow up with Dr. Burr Medico  -Mammogram due in 07/2017, orders placed today -Follow up with Dr. Brantley Stage at [facility] in 04/2017. -She is welcome to return back to the Survivorship Clinic at any time; no additional follow-up needed at this time.  -Consider referral back to survivorship as a long-term survivor for continued surveillance  A total of (30) minutes of face-to-face time was spent with this patient with greater than 50% of that time in counseling and care-coordination.   Janice Phlegm, NP Survivorship Program [place] [phone]   Note: PRIMARY CARE PROVIDER Jonathon Jordan, [place] [phone]

## 2017-02-24 ENCOUNTER — Ambulatory Visit (INDEPENDENT_AMBULATORY_CARE_PROVIDER_SITE_OTHER): Payer: 59 | Admitting: Family Medicine

## 2017-02-24 ENCOUNTER — Encounter: Payer: Self-pay | Admitting: Family Medicine

## 2017-02-24 VITALS — BP 118/80 | HR 74 | Resp 12 | Ht 67.0 in | Wt 134.4 lb

## 2017-02-24 DIAGNOSIS — Z131 Encounter for screening for diabetes mellitus: Secondary | ICD-10-CM

## 2017-02-24 DIAGNOSIS — Z13220 Encounter for screening for lipoid disorders: Secondary | ICD-10-CM | POA: Diagnosis not present

## 2017-02-24 DIAGNOSIS — J309 Allergic rhinitis, unspecified: Secondary | ICD-10-CM | POA: Diagnosis not present

## 2017-02-24 DIAGNOSIS — Z0000 Encounter for general adult medical examination without abnormal findings: Secondary | ICD-10-CM | POA: Diagnosis not present

## 2017-02-24 DIAGNOSIS — Z23 Encounter for immunization: Secondary | ICD-10-CM

## 2017-02-24 LAB — BASIC METABOLIC PANEL
BUN: 16 mg/dL (ref 6–23)
CALCIUM: 9.5 mg/dL (ref 8.4–10.5)
CHLORIDE: 107 meq/L (ref 96–112)
CO2: 28 meq/L (ref 19–32)
Creatinine, Ser: 0.95 mg/dL (ref 0.40–1.20)
GFR: 66.93 mL/min (ref >60.00)
Glucose, Bld: 84 mg/dL (ref 70–99)
POTASSIUM: 4.8 meq/L (ref 3.5–5.1)
SODIUM: 140 meq/L (ref 135–145)

## 2017-02-24 LAB — LIPID PANEL
Cholesterol: 161 mg/dL (ref 0–200)
HDL: 61.7 mg/dL (ref >39.00)
LDL Cholesterol: 85 mg/dL (ref 0–99)
NONHDL: 99.24
Total CHOL/HDL Ratio: 3
Triglycerides: 72 mg/dL (ref 0.0–149.0)
VLDL: 14.4 mg/dL (ref 0.0–40.0)

## 2017-02-24 NOTE — Patient Instructions (Addendum)
A few things to remember from today's visit:   Screening for lipid disorders - Plan: Lipid panel  Diabetes mellitus screening - Plan: Basic metabolic panel  Routine general medical examination at a health care facility  Today you have you routine preventive visit.  At least 150 minutes of moderate exercise per week, daily brisk walking for 15-30 min is a good exercise option. Healthy diet low in saturated (animal) fats and sweets and consisting of fresh fruits and vegetables, lean meats such as fish and white chicken and whole grains.  These are some of recommendations for screening depending of age and risk factors:   - Vaccines:  Tdap vaccine every 10 years.  Shingles vaccine recommended at age 68, could be given after 47 years of age but not sure about insurance coverage.   Pneumonia vaccines:  Prevnar 13 at 65 and Pneumovax at 64. Sometimes Pneumovax is giving earlier if history of smoking, lung disease,diabetes,kidney disease among some.    Screening for diabetes at age 77 and every 3 years.  Cervical cancer prevention:  Pap smear starts at 47 years of age and continues periodically until 47 years old in low risk women. Pap smear every 3 years between 72 and 9 years old. Pap smear every 3-5 years between women 28 and older if pap smear negative and HPV screening negative.   -Breast cancer: Mammogram: annually  Colon cancer screening: starts at 47 years old until 47 years old.  Cholesterol disorder screening at age 28 and every 3 years.  Also recommended:  1. Dental visit- Brush and floss your teeth twice daily; visit your dentist twice a year. 2. Eye doctor- Get an eye exam at least every 2 years. 3. Helmet use- Always wear a helmet when riding a bicycle, motorcycle, rollerblading or skateboarding. 4. Safe sex- If you may be exposed to sexually transmitted infections, use a condom. 5. Seat belts- Seat belts can save your live; always wear one. 6. Smoke/Carbon  Monoxide detectors- These detectors need to be installed on the appropriate level of your home. Replace batteries at least once a year. 7. Skin cancer- When out in the sun please cover up and use sunscreen 15 SPF or higher. 8. Violence- If anyone is threatening or hurting you, please tell your healthcare provider.  9. Drink alcohol in moderation- Limit alcohol intake to one drink or less per day. Never drink and drive. 10. Calcium with Vit D 1200 mg and 800 U respectivelly and daily.   Please be sure medication list is accurate. If a new problem present, please set up appointment sooner than planned today.

## 2017-02-24 NOTE — Progress Notes (Signed)
HPI:   [name] is a 46 y.o. female, who is here today to establish care.  Former PCP: Dr Thayer Jew at [facility] Last preventive routine visit: 05/2015.  Chronic medical problems: Right breast cancer ,anxiety, allergic rhinitis.  S/P right breast lumpectomy and lymph nodes Bx (negative pathology).  She is on Tamoxifen and following with oncologist, Dr Burr Medico.  She lives with fiance.   Concerns today: None. She would like to have her CPE today and lab work.  She exercises regularly and follows a healthy diet. Last eye exam 05/2015.  Denies history of hypertension, diabetes, or hyperlipidemia.  Gyne preventive care up to date, she sees Dr Julien Girt.   Review of Systems  Constitutional: Negative for appetite change, fatigue and fever.  HENT: Negative for dental problem, hearing loss, mouth sores, trouble swallowing and voice change.   Eyes: Negative for redness and visual disturbance.  Respiratory: Negative for cough, shortness of breath and wheezing.   Cardiovascular: Negative for chest pain, palpitations and leg swelling.  Gastrointestinal: Negative for abdominal pain, nausea and vomiting.       No changes in bowel habits.  Endocrine: Negative for cold intolerance, heat intolerance, polydipsia, polyphagia and polyuria.  Genitourinary: Negative for decreased urine volume, dysuria, hematuria, vaginal bleeding and vaginal discharge.  Musculoskeletal: Negative for back pain, gait problem and neck pain.  Skin: Negative for color change and rash.  Allergic/Immunologic: Positive for environmental allergies.  Neurological: Positive for numbness (around surgical scar, right axillar). Negative for syncope, weakness and headaches.  Hematological: Negative for adenopathy. Does not bruise/bleed easily.  Psychiatric/Behavioral: Negative for confusion and sleep disturbance. The patient is nervous/anxious.   All other systems reviewed and are  negative.     Current Outpatient Prescriptions on File Prior to Visit  Medication Sig Dispense Refill  . ALPRAZolam (XANAX) 0.5 MG tablet Take 0.5 mg by mouth at bedtime as needed for anxiety.    . Cyanocobalamin (B-12 PO) Take 1 Dose by mouth daily.    . Emollient (AVEENO ACTIVE NAT SKIN RELIEF) CREA Apply topically.    . fexofenadine (ALLEGRA) 180 MG tablet Take 180 mg by mouth daily as needed.     . Multiple Vitamin (MULTIVITAMIN) tablet Take 1 tablet by mouth daily.    . vitamin C (ASCORBIC ACID) 500 MG tablet Take 500 mg by mouth daily.     No current facility-administered medications on file prior to visit.      Past Medical History:  Diagnosis Date  . Allergy   . Breast cancer ([place]) 08/2016   right  . Chicken pox   . Dental crown present   . HIV infection ([place])    TESTING FOR HIV  . Urinary tract infection    Allergies  Allergen Reactions  . Erythromycin Nausea And Vomiting    Family History  Problem Relation Age of Onset  . Diabetes Mellitus II Father   . Arthritis Father   . Diabetes Father   . Heart attack Father   . Kidney disease Father   . Stroke Father   . Heart attack Maternal Grandmother   . Heart attack Maternal Grandfather   . Asthma Maternal Grandfather   . COPD Maternal Grandfather   . Early death Maternal Grandfather   . Hearing loss Maternal Grandfather   . Asthma Paternal Grandmother   . Early death Paternal Grandfather     Social History   Social History  . Marital status: Divorced    Spouse  name: N/A  . Number of children: N/A  . Years of education: N/A   Social History Main Topics  . Smoking status: Never Smoker  . Smokeless tobacco: Never Used  . Alcohol use 0.6 - 1.2 oz/week    1 - 2 Glasses of wine per week     Comment: 3 x/week  . Drug use: No  . Sexual activity: Not Asked   Other Topics Concern  . None   Social History Narrative  . None    Vitals:   02/24/17 0653  BP: 118/80  Pulse: 74  Resp: 12  SpO2: 96%     Body mass index is 21.05 kg/m.   Physical Exam  Nursing note and vitals reviewed. Constitutional: She is oriented to person, place, and time. She appears well-developed and well-nourished. No distress.  HENT:  Head: Normocephalic and atraumatic.  Right Ear: Hearing, tympanic membrane, external ear and ear canal normal.  Left Ear: Hearing, tympanic membrane, external ear and ear canal normal.  Mouth/Throat: Uvula is midline, oropharynx is clear and moist and mucous membranes are normal.  Eyes: Pupils are equal, round, and reactive to light. Conjunctivae and EOM are normal.  Neck: No tracheal deviation present. No thyromegaly present.  Cardiovascular: Normal rate and regular rhythm.   No murmur heard. Pulses:      Dorsalis pedis pulses are 2+ on the right side, and 2+ on the left side.  Respiratory: Effort normal and breath sounds normal. No respiratory distress.  GI: Soft. She exhibits no mass. There is no hepatomegaly. There is no tenderness.  Genitourinary:  Genitourinary Comments: Deferred to gyn.  Musculoskeletal: She exhibits no edema or tenderness.  No major deformity or signs of synovitis appreciated.  Lymphadenopathy:    She has no cervical adenopathy.       Right: No supraclavicular adenopathy present.       Left: No supraclavicular adenopathy present.  Neurological: She is alert and oriented to person, place, and time. She has normal strength. No cranial nerve deficit. Coordination and gait normal.  Reflex Scores:      Bicep reflexes are 2+ on the right side and 2+ on the left side.      Patellar reflexes are 2+ on the right side and 2+ on the left side. Skin: Skin is warm. No rash noted. No erythema.  Psychiatric: She has a normal mood and affect. Her speech is normal.  Well groomed, good eye contact.    ASSESSMENT AND PLAN:   [name] was seen today for establish care and annual exam.  Diagnoses and all orders for this visit:  Lab Results  Component  Value Date   CHOL 161 02/24/2017   HDL 61.70 02/24/2017   LDLCALC 85 02/24/2017   TRIG 72.0 02/24/2017   CHOLHDL 3 02/24/2017   Lab Results  Component Value Date   CREATININE 0.95 02/24/2017   BUN 16 02/24/2017   NA 140 02/24/2017   K 4.8 02/24/2017   CL 107 02/24/2017   CO2 28 02/24/2017    Routine general medical examination at a health care facility    We discussed the importance of regular physical activity and healthy diet for prevention of chronic illness and/or complications. Preventive guidelines reviewed. Vaccination up to date. She will continue following with her gyn for her routine female preventive care. Ca++ and vit D supplementation recommended. Next CPE in a year.  The 10-year ASCVD risk score Mikey Bussing DC Jr., et al., 2013) is: 0.5%   Values used  to calculate the score:     Age: 23 years     Sex: Female     Is Non-Hispanic African American: No     Diabetic: No     Tobacco smoker: No     Systolic Blood Pressure: 300 mmHg     Is BP treated: No     HDL Cholesterol: 61.7 mg/dL     Total Cholesterol: 161 mg/dL  Screening for lipid disorders -     Lipid panel  Diabetes mellitus screening -     Basic metabolic panel  Allergic rhinitis, unspecified seasonality, unspecified trigger  Stable. OTC antihistaminic and/or steroid nasal spray may also help. F/U as needed.  Need for influenza vaccination -     Flu Vaccine QUAD 36+ mos IM     Adrea Sherpa G. [name], MD  [facility]. [place] office.

## 2017-02-25 ENCOUNTER — Other Ambulatory Visit: Payer: Self-pay | Admitting: Hematology

## 2017-02-25 DIAGNOSIS — C50911 Malignant neoplasm of unspecified site of right female breast: Secondary | ICD-10-CM

## 2017-02-25 MED ORDER — TAMOXIFEN CITRATE 20 MG PO TABS: 20.0000 mg | ORAL_TABLET | Freq: Every day | ORAL | 2 refills | Status: DC

## 2017-02-26 ENCOUNTER — Encounter: Payer: Self-pay | Admitting: Family Medicine

## 2017-03-25 NOTE — Progress Notes (Signed)
[place]  [phone] [phone]  Clinic Follow Up Note   Patient Care Team: Jonathon Jordan, MD as PCP - General (Family Medicine) Erroll Luna, MD as Consulting Physician (General Surgery) Kyung Rudd, MD as Consulting Physician (Radiation Oncology) Truitt Merle, MD as Consulting Physician (Hematology) Gardenia Phlegm, NP as Nurse Practitioner (Hematology and Oncology) 03/26/2017    CHIEF COMPLAINTS:  Follow up Right Breast Invasive Lobular Carcinoma, ER/PR Positive, Her2 Negative, stage IA  Oncology History   Cancer Staging Malignant neoplasm of upper-outer quadrant of right breast in female, estrogen receptor positive ([place]) Staging form: Breast, AJCC 8th Edition - Pathologic stage from 08/27/2016: Stage IA (pT1a(m), pN0, cM0, G1, ER: Positive, PR: Positive, HER2: Negative) - Signed by Truitt Merle, MD on 09/09/2016 - Pathologic: No stage assigned - Unsigned       Malignant neoplasm of upper-outer quadrant of right breast in female, estrogen receptor positive ([place])   08/05/2016 Mammogram    Targeted ultrasound of the right breast was performed demonstrating an irregular hypoechoic mass at 12:30, 5 cm from the nipple measuring 6 x 4 x 7 mm which is thought to correspond to the area of distortion seen mammographically. Targeted ultrasound of the right axilla demonstrates no suspicious appearing axillary lymph nodes.      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      08/24/2016 Initial Diagnosis    Malignant neoplasm of upper-outer quadrant of right breast in female, estrogen receptor positive ([place])      08/27/2016 Surgery    Right lumpectomy and SLN biopsy,  Dr. Brantley Stage       08/27/2016 Pathology Results    Right lumpectomy showed [place] and LCIS, G1, final margins are negative, all 6 nodes are negative       10/06/2016 - 11/24/2016 Radiation Therapy    With Dr.Moody. 1. The Right breast was treated to 50.4 Gy in 28 fractions at 1.8 Gy per fraction. 2. The Right breast was boosted to 10 Gy in 5 fractions at 2 Gy per fraction.       12/2016 -  Anti-estrogen oral therapy    Tamoxifen daily        HISTORY OF PRESENTING ILLNESS (09/10/16):  [name] 46 y.o. female is here because of a recent diagnosis of right breast cancer.  She presents to the clinic today and was referred by Dr. Brantley Stage. This was discovered by Screening mammogram. No abnormal feelings during the finding. She has been yearly to get mammogram. Patient feels the surgery went well. She reports a mild seroma that was drained earlier this week by Dr. Brantley Stage. She is having nerve pain on right arm. She is almost able to raise right arm all the way. Considering Physical Therapy for arm. Stopped Vicodin for pain and instead was using 3 Advil 3 gtimes a day. Have not taken Xanax since biopsy "only as emergency".   She reports she stopped taking birth control and she went Perimenopausal. She started taking Soy estrogen to alleviate  symptoms. She stopped taking this 3 weeks ago and so far no symptoms of Perimenopausal coming back. She feels  she is handling things well.   On 4/26 her BP was high but she reports it is normally in range at home. No history of cancer in family but has history of other chronic diseases. Has a job that is stressful but manages it well. She exercises several times a week. PCP Is Dr. Alessandra Grout. The patient reports she has not seen her PCP recently for routine lab work. Has a gynecologist Dr. Julien Girt.   GYN HISTORY  Menarchal: Not reported LMP: a few years ago  Contraceptive: stopped and went perimenopausal HRT: No GP: G2P0, She had one miscarriage and  one abortion due to no fetal heart beat.    CURRENT TREATMENT: Tamoxifen '20mg'$  daily started in 12/07/16  INTERIM HISTORY:  Today, she presents to the clinic for follow up. Sh presents to the clinic today noting her hot flashes has increasing get worse at first. She would take Tamoxifen at night then produce hot flashes at night and then presented in the daytime. Lately her hot flashes are less often and are manageable. She [place] noted eye dryness and change in vision, but has a eye appointment for next month.   She did have period in October and a brief period in September. She recently felt heaviness in her uterus and tenderness near her bladder. This has calmed down in the last week. She was previously on a birth control but due to risk of blood clots she stopped and resumed regular periods. She loss slight weight which brought her back to her normal weight. Overall she is tolerating Tamoxifen well.   She sees her aesthetician who held her treatment as she wants to know what products to stay away from.  PT helped her chording and she continues to exercise to prevent lymphedema. Overall she is doing well.    MEDICAL HISTORY:  Past Medical History:  Diagnosis Date  . Allergy   . Breast cancer ([place]) 08/2016   right  . Chicken pox   . Dental crown present   . HIV infection ([place])    TESTING FOR HIV  . Urinary tract infection     SURGICAL HISTORY: Past Surgical History:  Procedure Laterality Date  . BREAST SURGERY     biopsy  . DILATION AND EVACUATION  04/08/2008; 08/01/2008    SOCIAL HISTORY: Social History   Socioeconomic History  . Marital status: Divorced    Spouse name: Not on file  . Number of children: Not on file  . Years of education: Not on file  . Highest education level: Not on file  Social Needs  . Financial resource strain: Not on file  . Food insecurity - worry: Not on file  . Food insecurity - inability: Not on file  . Transportation needs - medical: Not on  file  . Transportation needs - non-medical: Not on file  Occupational History  . Not on file  Tobacco Use  . Smoking status: Never Smoker  . Smokeless tobacco: Never Used  Substance and Sexual Activity  . Alcohol use: Yes    Alcohol/week: 0.6 - 1.2 oz    Types: 1 - 2 Glasses of wine per week    Comment: 3 x/week  . Drug use: No  . Sexual activity: Not on file  Other Topics Concern  . Not on file  Social History Narrative  . Not on file    FAMILY HISTORY: Family History  Problem Relation Age of Onset  . Diabetes Mellitus II Father   . Arthritis Father   .  Diabetes Father   . Heart attack Father   . Kidney disease Father   . Stroke Father   . Heart attack Maternal Grandmother   . Heart attack Maternal Grandfather   . Asthma Maternal Grandfather   . COPD Maternal Grandfather   . Early death Maternal Grandfather   . Hearing loss Maternal Grandfather   . Asthma Paternal Grandmother   . Early death Paternal Grandfather     ALLERGIES:  is allergic to erythromycin.  MEDICATIONS:  Current Outpatient Medications  Medication Sig Dispense Refill  . ALPRAZolam (XANAX) 0.5 MG tablet Take 0.5 mg by mouth at bedtime as needed for anxiety.    . Cyanocobalamin (B-12 PO) Take 1 Dose by mouth daily.    . Emollient (AVEENO ACTIVE NAT SKIN RELIEF) CREA Apply topically.    . Melatonin 10 MG TABS Take 1 tablet by mouth at bedtime as needed.    . Multiple Vitamin (MULTIVITAMIN) tablet Take 1 tablet by mouth daily.    . Psyllium (METAMUCIL FIBER PO) Take by mouth as needed.    . tamoxifen (NOLVADEX) 20 MG tablet Take 1 tablet (20 mg total) by mouth daily. 30 tablet 2  . fexofenadine (ALLEGRA) 180 MG tablet Take 180 mg by mouth daily as needed.     . vitamin C (ASCORBIC ACID) 500 MG tablet Take 500 mg by mouth daily.     No current facility-administered medications for this visit.     REVIEW OF SYSTEMS:  Constitutional: Denies fevers, chills or abnormal night sweats (+) hot flashes,  tolerable  Eyes: Denies blurriness of vision, double vision or watery eyes (+) vision change and eye dryness Ears, nose, mouth, throat, and face: Denies mucositis or sore throat Respiratory: Denies cough, dyspnea or wheezes Cardiovascular: Denies palpitation, chest discomfort or lower extremity swelling Gastrointestinal:  Denies nausea, heartburn or change in bowel habits Skin: Denies abnormal skin rashes Lymphatics: Denies new lymphadenopathy or easy bruising Neurological:Denies numbness, tingling or new weaknesses Breast: (+) right breast itching occasionally  Behavioral/Psych: Mood is stable, no new changes  Musculosketaton:  All other systems were reviewed with the patient and are negative.  PHYSICAL EXAMINATION:  ECOG PERFORMANCE STATUS: 0 - Asymptomatic  Vitals:   03/26/17 0901  BP: 133/65  Pulse: (!) 57  Resp: 17  Temp: 98.4 F (36.9 C)  SpO2: 100%   Filed Weights   03/26/17 0901  Weight: 137 lb 8 oz (62.4 kg)    GENERAL:alert, no distress and comfortable SKIN: skin color, texture, turgor are normal, no rashes or significant lesions EYES: normal, conjunctiva are pink and non-injected, sclera clear OROPHARYNX:no exudate, no erythema and lips, buccal mucosa, and tongue normal  NECK: supple, thyroid normal size, non-tender, without nodularity LYMPH:  no palpable lymphadenopathy in the cervical, axillary or inguinal LUNGS: clear to auscultation and percussion with normal breathing effort HEART: regular rate & rhythm and no murmurs and no lower extremity edema ABDOMEN:abdomen soft, non-tender and normal bowel sounds Musculoskeletal:no cyanosis of digits and no clubbing  PSYCH: alert & oriented x 3 with fluent speech NEURO: no focal motor/sensory deficits Breast: surgical excision in right upper mid-breast is healed well, mild skin pigmentation of the right breast secondary to radiation, almost back to normal skin color. Right aurora slightly bigger than the left, no  significant lymphedema. Palpitation of the bilateral breast and axilla showed no mass or adenopathy.  LABORATORY DATA:  I have reviewed the data as listed CBC Latest Ref Rng & Units 03/26/2017 10/09/2016 08/01/2008  WBC  3.9 - 10.3 10e3/uL 4.3 7.0 8.5  Hemoglobin 11.6 - 15.9 g/dL 13.3 14.3 14.2  Hematocrit 34.8 - 46.6 % 38.6 41.1 41.5  Platelets 145 - 400 10e3/uL 189 248 212   CMP Latest Ref Rng & Units 03/26/2017 02/24/2017 10/09/2016  Glucose 70 - 140 mg/dl 88 84 54(L)  BUN 7.0 - 26.0 mg/dL 9.8 16 13.5  Creatinine 0.6 - 1.1 mg/dL 0.8 0.95 0.9  Sodium 136 - 145 mEq/L 139 140 140  Potassium 3.5 - 5.1 mEq/L 4.1 4.8 4.5  Chloride 96 - 112 mEq/L - 107 -  CO2 22 - 29 mEq/L 24 28 27   Calcium 8.4 - 10.4 mg/dL 8.9 9.5 9.5  Total Protein 6.4 - 8.3 g/dL 6.7 - 6.8  Total Bilirubin 0.20 - 1.20 mg/dL 0.92 - 1.15  Alkaline Phos 40 - 150 U/L 41 - 56  AST 5 - 34 U/L 16 - 18  ALT 0 - 55 U/L 17 - 12    Surgical Pathology  08/27/16 1. Breast, lumpectomy, Right - INVASIVE LOBULAR CARCINOMA, GRADE I/III, TWO FOCI SPANNING 0.5 CM AND A SEPARATE MICROSCOPIC FOCUS. - LOBULAR CARCINOMA IN SITU. - THE SURGICAL RESECTION MARGINS ARE NEGATIVE FOR INVASIVE CARCINOMA. - SEE ONCOLOGY TABLE BELOW. 2. Breast, excision, Right new medial margin - BENIGN BREAST PARENCHYMA. - THERE IS NO EVIDENCE OF MALIGNANCY. - SEE COMMENT. 3. Lymph node, sentinel, biopsy, Right axillary - THERE IS NO EVIDENCE OF CARCINOMA IN 1 OF 1 LYMPH NODE (0/1). - SEE COMMENT. 4. Lymph node, sentinel, biopsy, Right axillary - THERE IS NO EVIDENCE OF CARCINOMA IN 1 OF 1 LYMPH NODE (0/1). - SEE COMMENT. 5. Lymph node, sentinel, biopsy, Right axillary - THERE IS NO EVIDENCE OF CARCINOMA IN 1 OF 1 LYMPH NODE (0/1). - SEE COMMENT. 6. Lymph node, sentinel, biopsy, Right axillary - THERE IS NO EVIDENCE OF CARCINOMA IN 1 OF 1 LYMPH NODE (0/1). - SEE COMMENT. 7. Lymph node, sentinel, biopsy, Right axillary - THERE IS NO EVIDENCE OF CARCINOMA  IN 1 OF 1 LYMPH NODE (0/1). - SEE COMMENT. 8. Lymph node, sentinel, biopsy, Right axillary - THERE IS NO EVIDENCE OF CARCINOMA IN 1 OF 1 LYMPH NODE (0/1). - SEE COMMENT.  08/06/16 Breast, right, needle core biopsy, 12:30 o'clock - INVASIVE MAMMARY CARCINOMA, SEE COMMENT. - LOBULAR NEOPLASIA (ATYPICAL LOBULAR HYPERPLASIA).  RADIOGRAPHIC STUDIES: I have personally reviewed the radiological images as listed and agreed with the findings in the report. No results found.  ASSESSMENT & PLAN: [name] is a 46 y.o. premenopausal female with a recent diagnosis of Stage cT1bN0M0 invasive lobular carcinoma of the UOQ of right breast, ER/PR positive, Her2 negative, Ki67 10%, Grade I.    1. Malignant new [place] of upper-outer quadrant of right breast, stage IA (pT1bN0M0), Invasive Lobular Carcinoma, ER/PR Positive, Her2 Negative, Grade I  -Her neoplasm was discovered initially by routine screening mammography on 08/05/16. - We reviewed the surgical pathology from 08/27/16, as well as staging and biology of the tumor. Pathology revealed ER/PR positive, Her2 negative, Ki67 10%. Surgical margins were negative. 6 lymph nodes were all negative.  -We discussed the risk of cancer recurrence after her complete surgical resection. Given the very early stage disease, and low-grade tumor, her risk of recurrence is likely low. - In light of the patient's very early stage and low grade, she does not require adjuvant chemo treatment. I do not think she needs Oncotype Dx test. -She underwent radiation with Dr. Lisbeth Renshaw 10/06/16-11/24/16 -She started Tamoxifen in 11/2016,  experiencing manageable eye dryness, vision change, lighter periods and hot flashes, which has improved lately. -I suggest she watched for any menorrhagia and use OTC artificial tears. I advise her to stay away from topical products with estrogen or soy products in the ingredients.  -She is clinically doing well. Her CBC, CMP are within normal  limits. Her breast exam was unremarkable. There is no clinical concern for recurrence.  -She is scheduled for a mammogram in 07/2017 -Continue breast cancer surveillance.  F/u in 4 months   2. Upper Right Arm Pain associated with Surgery  -resolved now, she benefited from PT significantly    PLAN: -Continue tamoxifen, she is tolerating well -Diagnostic mammogram in 07/2017 -Lab and f/u in 4 months     Orders Placed This Encounter  Procedures  . MM DIAG BREAST TOMO BILATERAL    Standing Status:   Future    Standing Expiration Date:   03/26/2018    Order Specific Question:   Reason for Exam (SYMPTOM  OR DIAGNOSIS REQUIRED)    Answer:   screening    Order Specific Question:   Is the patient pregnant?    Answer:   No    Order Specific Question:   Preferred imaging location?    Answer:   [facility]   I spent 25 minutes for this visit, more than 50% face-to-face counseling.  This document serves as a record of services personally performed by Truitt Merle, MD. It was created on her behalf by Joslyn Devon, a trained medical scribe. The creation of this record is based on the scribe's personal observations and the provider's statements to them.    I have reviewed the above documentation for accuracy and completeness, and I agree with the above.    Truitt Merle, MD 03/26/2017

## 2017-03-26 ENCOUNTER — Encounter: Payer: Self-pay | Admitting: Hematology

## 2017-03-26 ENCOUNTER — Other Ambulatory Visit (HOSPITAL_BASED_OUTPATIENT_CLINIC_OR_DEPARTMENT_OTHER): Payer: 59

## 2017-03-26 ENCOUNTER — Inpatient Hospital Stay: Payer: 59 | Attending: Hematology | Admitting: Hematology

## 2017-03-26 VITALS — BP 133/65 | HR 57 | Temp 98.4°F | Resp 17 | Ht 67.0 in | Wt 137.5 lb

## 2017-03-26 DIAGNOSIS — Z170 Estrogen receptor positive status [ER+]: Secondary | ICD-10-CM

## 2017-03-26 DIAGNOSIS — C50411 Malignant neoplasm of upper-outer quadrant of right female breast: Secondary | ICD-10-CM | POA: Diagnosis not present

## 2017-03-26 LAB — COMPREHENSIVE METABOLIC PANEL
ALBUMIN: 3.8 g/dL (ref 3.5–5.0)
ALK PHOS: 41 U/L (ref 40–150)
ALT: 17 U/L (ref 0–55)
ANION GAP: 8 meq/L (ref 3–11)
AST: 16 U/L (ref 5–34)
BUN: 9.8 mg/dL (ref 7.0–26.0)
CALCIUM: 8.9 mg/dL (ref 8.4–10.4)
CO2: 24 meq/L (ref 22–29)
Chloride: 107 mEq/L (ref 98–109)
Creatinine: 0.8 mg/dL (ref 0.6–1.1)
Glucose: 88 mg/dl (ref 70–140)
Potassium: 4.1 mEq/L (ref 3.5–5.1)
Sodium: 139 mEq/L (ref 136–145)
TOTAL PROTEIN: 6.7 g/dL (ref 6.4–8.3)
Total Bilirubin: 0.92 mg/dL (ref 0.20–1.20)

## 2017-03-26 LAB — CBC WITH DIFFERENTIAL/PLATELET
BASO%: 1 % (ref 0.0–2.0)
Basophils Absolute: 0 10*3/uL (ref 0.0–0.1)
EOS ABS: 0.1 10*3/uL (ref 0.0–0.5)
EOS%: 1.8 % (ref 0.0–7.0)
HEMATOCRIT: 38.6 % (ref 34.8–46.6)
HGB: 13.3 g/dL (ref 11.6–15.9)
LYMPH%: 26.4 % (ref 14.0–49.7)
MCH: 33.4 pg (ref 25.1–34.0)
MCHC: 34.5 g/dL (ref 31.5–36.0)
MCV: 96.7 fL (ref 79.5–101.0)
MONO#: 0.4 10*3/uL (ref 0.1–0.9)
MONO%: 8.7 % (ref 0.0–14.0)
NEUT%: 62.1 % (ref 38.4–76.8)
NEUTROS ABS: 2.7 10*3/uL (ref 1.5–6.5)
PLATELETS: 189 10*3/uL (ref 145–400)
RBC: 3.99 10*6/uL (ref 3.70–5.45)
RDW: 12.8 % (ref 11.2–14.5)
WBC: 4.3 10*3/uL (ref 3.9–10.3)
lymph#: 1.1 10*3/uL (ref 0.9–3.3)

## 2017-04-23 ENCOUNTER — Encounter: Payer: Self-pay | Admitting: Family Medicine

## 2017-04-23 DIAGNOSIS — D1801 Hemangioma of skin and subcutaneous tissue: Secondary | ICD-10-CM | POA: Diagnosis not present

## 2017-04-23 DIAGNOSIS — D225 Melanocytic nevi of trunk: Secondary | ICD-10-CM | POA: Diagnosis not present

## 2017-04-23 DIAGNOSIS — D485 Neoplasm of uncertain behavior of skin: Secondary | ICD-10-CM | POA: Diagnosis not present

## 2017-04-23 DIAGNOSIS — L821 Other seborrheic keratosis: Secondary | ICD-10-CM | POA: Diagnosis not present

## 2017-05-27 DIAGNOSIS — Z01419 Encounter for gynecological examination (general) (routine) without abnormal findings: Secondary | ICD-10-CM | POA: Diagnosis not present

## 2017-05-27 DIAGNOSIS — Z6821 Body mass index (BMI) 21.0-21.9, adult: Secondary | ICD-10-CM | POA: Diagnosis not present

## 2017-06-07 DIAGNOSIS — Z853 Personal history of malignant neoplasm of breast: Secondary | ICD-10-CM | POA: Diagnosis not present

## 2017-08-06 ENCOUNTER — Ambulatory Visit
Admission: RE | Admit: 2017-08-06 | Discharge: 2017-08-06 | Disposition: A | Discharge status: Home / Self Care | Payer: 59 | Source: Ambulatory Visit | Attending: Hematology | Admitting: Hematology

## 2017-08-06 DIAGNOSIS — C50411 Malignant neoplasm of upper-outer quadrant of right female breast: Secondary | ICD-10-CM

## 2017-08-06 DIAGNOSIS — R922 Inconclusive mammogram: Secondary | ICD-10-CM | POA: Diagnosis not present

## 2017-08-06 DIAGNOSIS — Z170 Estrogen receptor positive status [ER+]: Secondary | ICD-10-CM

## 2017-08-06 HISTORY — DX: Personal history of irradiation: Z92.3

## 2017-09-04 ENCOUNTER — Other Ambulatory Visit: Payer: Self-pay | Admitting: Hematology

## 2017-09-04 DIAGNOSIS — C50911 Malignant neoplasm of unspecified site of right female breast: Secondary | ICD-10-CM

## 2017-09-07 ENCOUNTER — Emergency Department (HOSPITAL_COMMUNITY)
Admission: EM | Admit: 2017-09-07 | Discharge: 2017-09-07 | Disposition: A | Discharge status: Home / Self Care | Payer: 59 | Attending: Emergency Medicine | Admitting: Emergency Medicine

## 2017-09-07 ENCOUNTER — Encounter (HOSPITAL_COMMUNITY): Payer: Self-pay | Admitting: Emergency Medicine

## 2017-09-07 ENCOUNTER — Emergency Department (HOSPITAL_COMMUNITY): Payer: 59

## 2017-09-07 DIAGNOSIS — Z79899 Other long term (current) drug therapy: Secondary | ICD-10-CM | POA: Diagnosis not present

## 2017-09-07 DIAGNOSIS — R1013 Epigastric pain: Secondary | ICD-10-CM | POA: Diagnosis not present

## 2017-09-07 DIAGNOSIS — R1011 Right upper quadrant pain: Secondary | ICD-10-CM | POA: Insufficient documentation

## 2017-09-07 LAB — I-STAT BETA HCG BLOOD, ED (MC, WL, AP ONLY)

## 2017-09-07 LAB — CBC
HCT: 38.4 % (ref 36.0–46.0)
HEMOGLOBIN: 13 g/dL (ref 12.0–15.0)
MCH: 32.3 pg (ref 26.0–34.0)
MCHC: 33.9 g/dL (ref 30.0–36.0)
MCV: 95.5 fL (ref 78.0–100.0)
Platelets: 220 10*3/uL (ref 150–400)
RBC: 4.02 MIL/uL (ref 3.87–5.11)
RDW: 12.7 % (ref 11.5–15.5)
WBC: 5.8 10*3/uL (ref 4.0–10.5)

## 2017-09-07 LAB — COMPREHENSIVE METABOLIC PANEL
ALBUMIN: 3.8 g/dL (ref 3.5–5.0)
ALK PHOS: 34 U/L — AB (ref 38–126)
ALT: 19 U/L (ref 14–54)
AST: 25 U/L (ref 15–41)
Anion gap: 11 (ref 5–15)
BUN: 14 mg/dL (ref 6–20)
CALCIUM: 9.2 mg/dL (ref 8.9–10.3)
CO2: 23 mmol/L (ref 22–32)
CREATININE: 0.93 mg/dL (ref 0.44–1.00)
Chloride: 104 mmol/L (ref 101–111)
GFR calc Af Amer: >60 mL/min (ref >60)
GFR calc non Af Amer: >60 mL/min (ref >60)
Glucose, Bld: 101 mg/dL — ABNORMAL HIGH (ref 65–99)
Potassium: 3.8 mmol/L (ref 3.5–5.1)
SODIUM: 138 mmol/L (ref 135–145)
Total Bilirubin: 1 mg/dL (ref 0.3–1.2)
Total Protein: 6.2 g/dL — ABNORMAL LOW (ref 6.5–8.1)

## 2017-09-07 LAB — URINALYSIS, ROUTINE W REFLEX MICROSCOPIC
BILIRUBIN URINE: NEGATIVE
Glucose, UA: NEGATIVE mg/dL
HGB URINE DIPSTICK: NEGATIVE
Ketones, ur: NEGATIVE mg/dL
Leukocytes, UA: NEGATIVE
Nitrite: NEGATIVE
Protein, ur: NEGATIVE mg/dL
SPECIFIC GRAVITY, URINE: 1.008 (ref 1.005–1.030)
pH: 8 (ref 5.0–8.0)

## 2017-09-07 LAB — LIPASE, BLOOD: Lipase: 32 U/L (ref 11–51)

## 2017-09-07 IMAGING — US US ABDOMEN LIMITED
1 series · 14 of 25 positions shown · non-contrast
Comparison: None.

CLINICAL DATA: Epigastric pain for several hours

EXAM:
ULTRASOUND ABDOMEN LIMITED RIGHT UPPER QUADRANT

[Series 1: us abdomen limited · 14 of 67 slices shown]
[im 1/67]
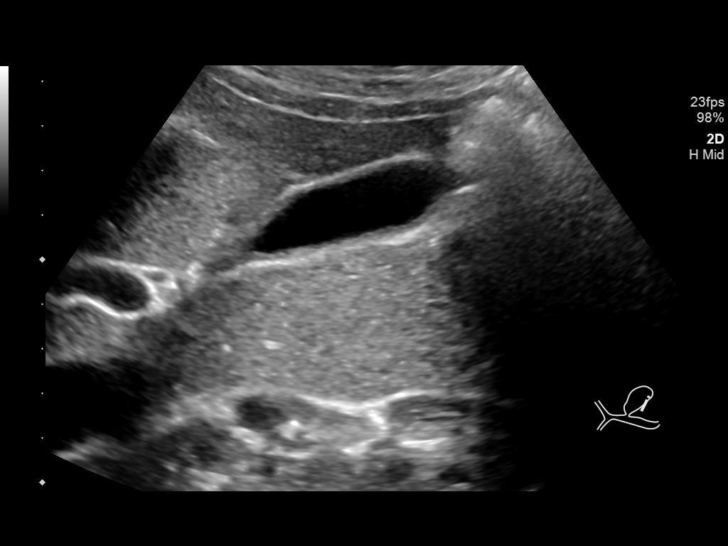
[im 6/67]
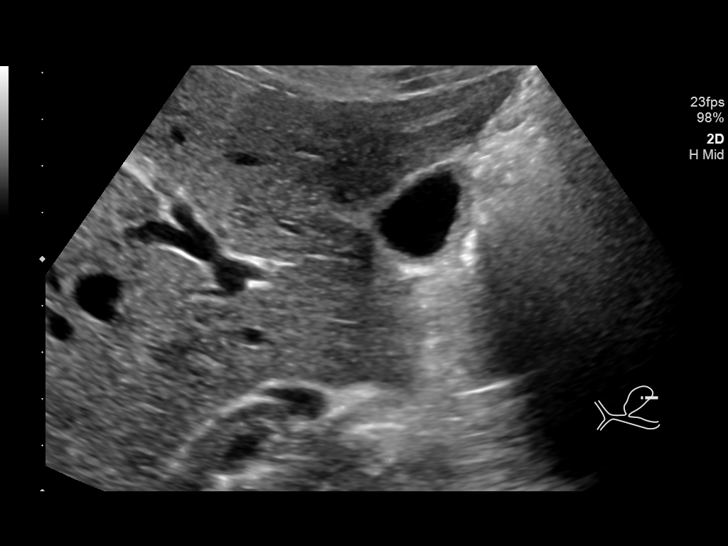
[im 12/67]
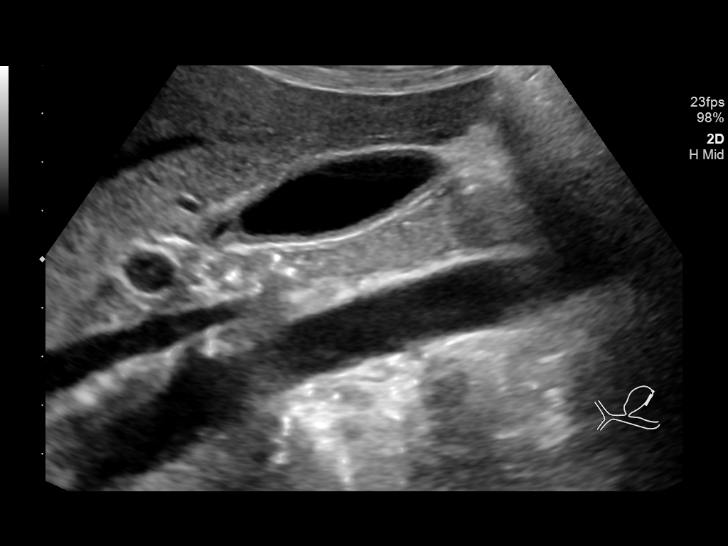
[im 17/67]
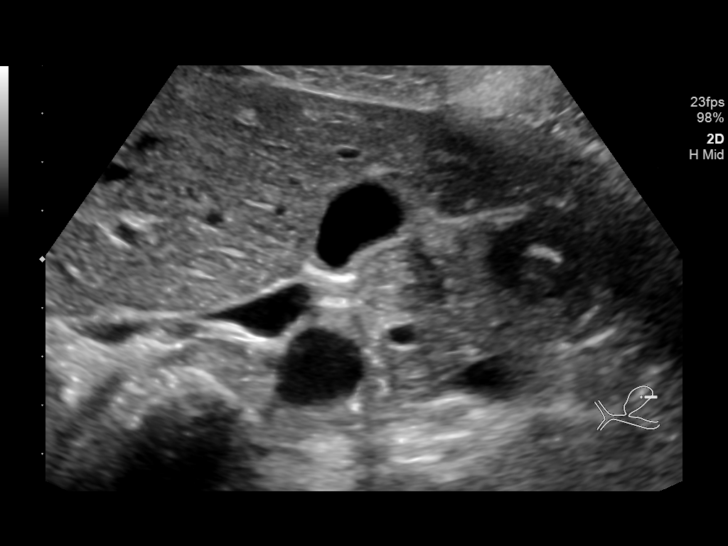
[im 23/67]
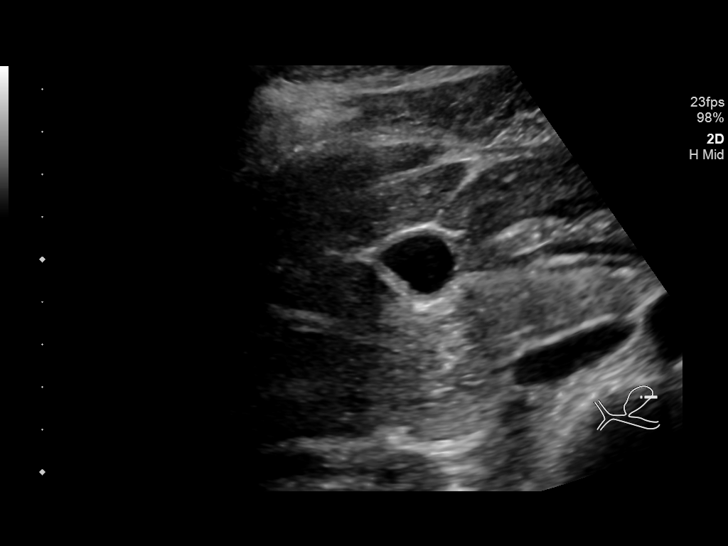
[im 25/67]
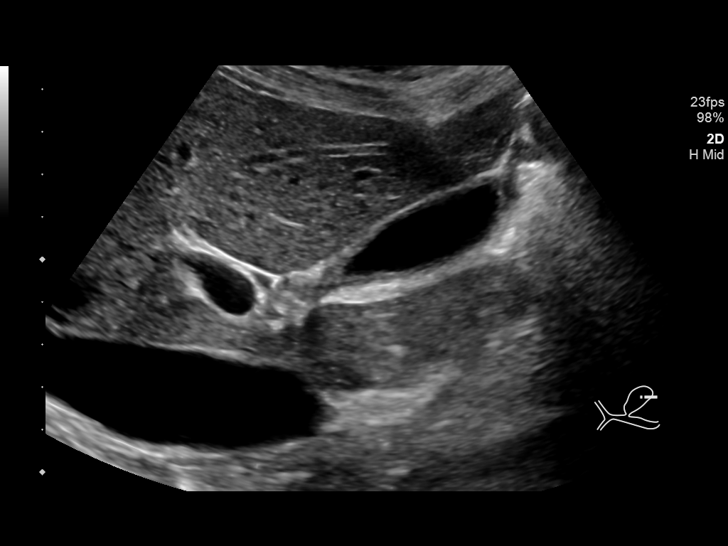
[im 31/67]
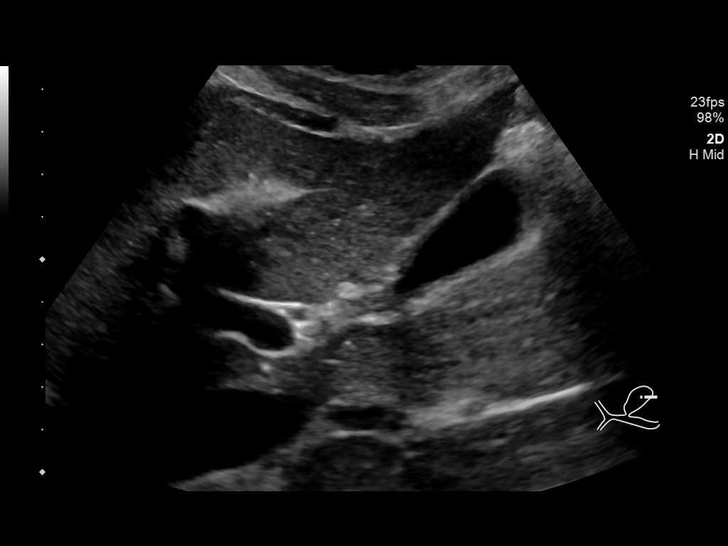
[im 36/67]
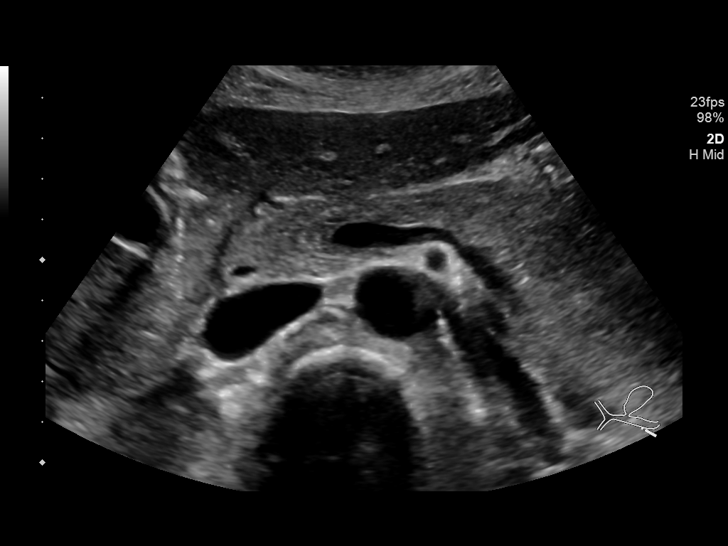
[im 42/67]
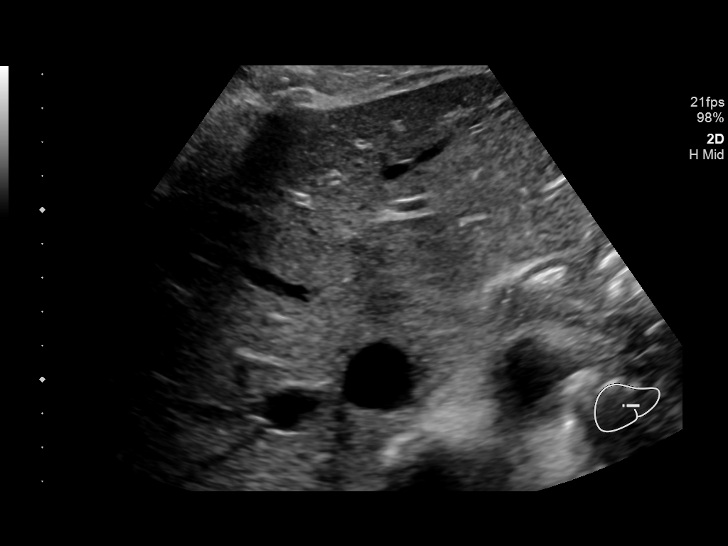
[im 45/67]
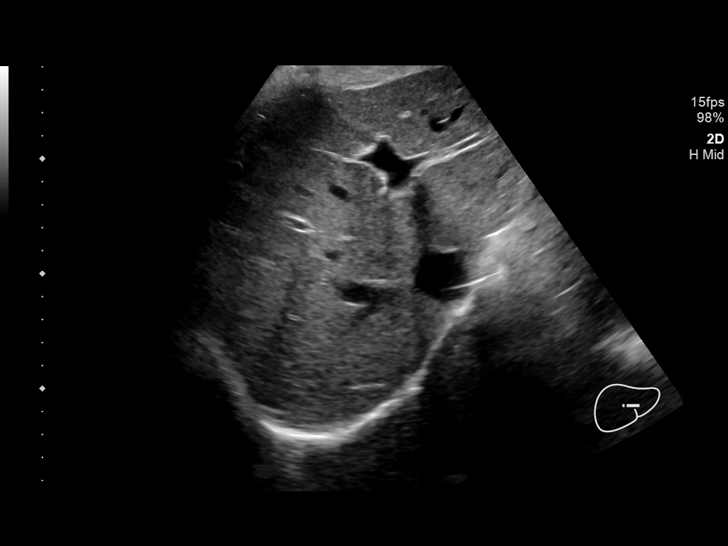
[im 50/67]
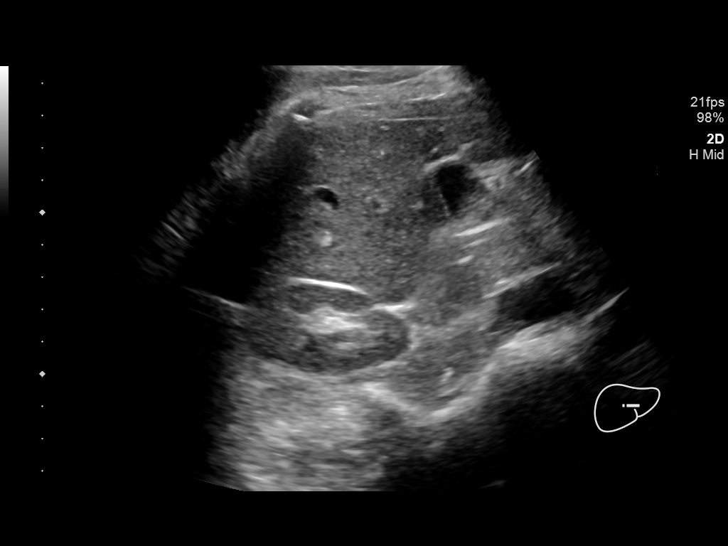
[im 56/67]
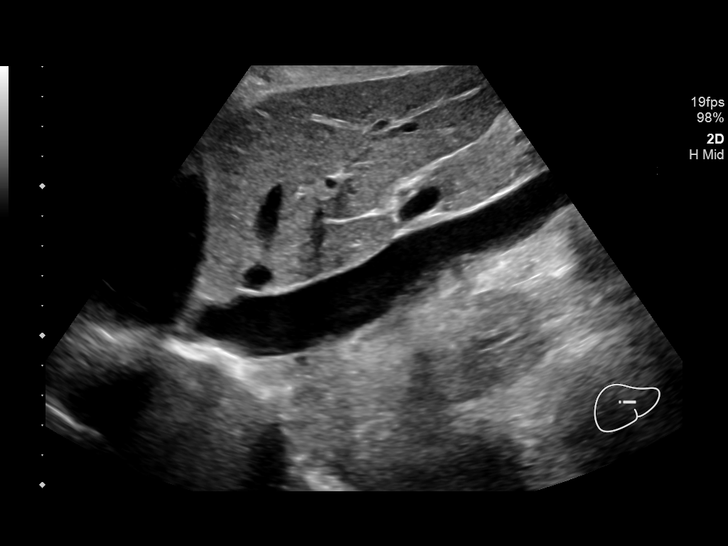
[im 61/67]
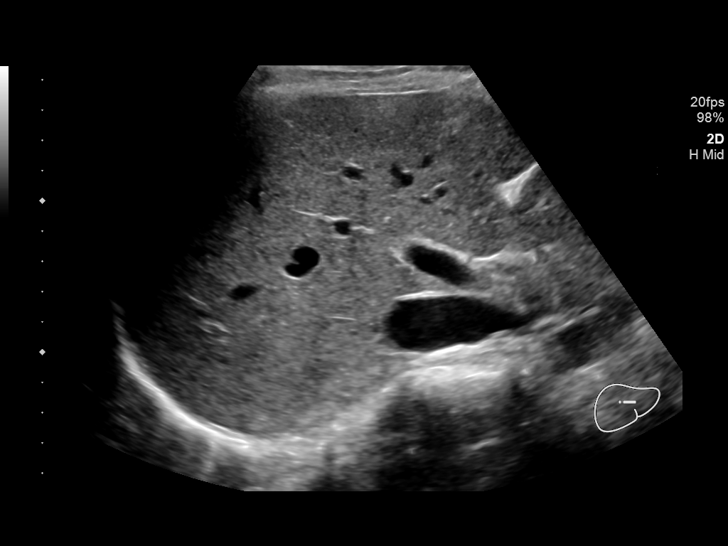
[im 67/67]
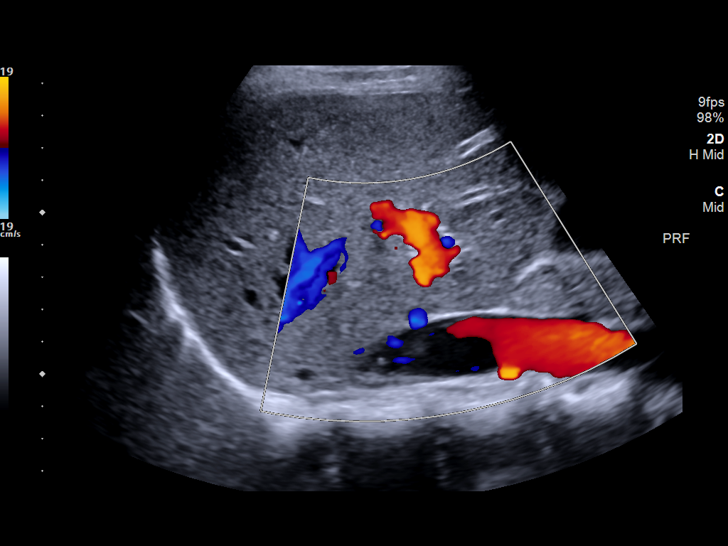

[14 of 25 positions shown; findings below may reference images not displayed]

FINDINGS: Gallbladder:

No gallstones or wall thickening visualized. No sonographic Murphy
sign noted by sonographer.

Common bile duct:

Diameter: 3 mm

Liver:

No focal lesion identified. Within normal limits in parenchymal
echogenicity. Portal vein is patent on color Doppler imaging with
normal direction of blood flow towards the liver.
IMPRESSION: No acute abnormality noted.

## 2017-09-07 MED ORDER — GI COCKTAIL ~~LOC~~
30.0000 mL | Freq: Once | ORAL | Status: AC
  Administered 2017-09-07: 30 mL via ORAL
  Filled 2017-09-07: qty 30

## 2017-09-07 MED ORDER — FAMOTIDINE 20 MG PO TABS: 20.0000 mg | ORAL_TABLET | Freq: Two times a day (BID) | ORAL | 0 refills | Status: DC

## 2017-09-07 MED ORDER — ONDANSETRON 4 MG PO TBDP: 4.0000 mg | ORAL_TABLET | Freq: Once | ORAL | Status: DC | PRN

## 2017-09-07 MED ORDER — DICYCLOMINE HCL 20 MG PO TABS: 20.0000 mg | ORAL_TABLET | Freq: Two times a day (BID) | ORAL | 0 refills | Status: DC

## 2017-09-07 NOTE — ED Notes (Signed)
[place] in room abd limited being done per provider

## 2017-09-07 NOTE — ED Triage Notes (Signed)
Pt c/o upper abd pain that started around 11am, went to lunch at 1130a and pains kept getting worse. Has nausea but no vomiting. Had normal BM this am and no urinary problems.

## 2017-09-07 NOTE — ED Provider Notes (Signed)
[facility] DEPT Provider Note   CSN: [id number] Arrival date & time: 09/07/17  1427     History   Chief Complaint Chief Complaint  Patient presents with  . Abdominal Pain  . Nausea    HPI [name] is a 47 y.o. female.  The history is provided by the patient. The history is limited by a language barrier.  Abdominal Pain   This is a new problem. The current episode started 6 to 12 hours ago. The problem has been gradually improving. The pain is associated with eating. The pain is located in the RUQ and epigastric region. The quality of the pain is colicky, cramping and burning. The pain is at a severity of 8/10. The pain is severe. Associated symptoms include anorexia and nausea. Nothing aggravates the symptoms. Her past medical history does not include PUD, gallstones, GERD, ulcerative colitis, Crohn's disease or irritable bowel syndrome.   Patient with abdominal pain. Began around 8; 30 Am with moderate epigastric pain. Worsened after eating soup ([place]) at noon. Described as colicky , intermittent 4-8/10 with anorexia and nausea. Pain radiated around to the back at times. No urinary sxs. No fever. No vomiting. Diarrhea or constipation.  Past Medical History:  Diagnosis Date  . Allergy   . Breast cancer ([place]) 08/2016   right  . Chicken pox   . Dental crown present   . HIV infection ([place])    TESTING FOR HIV  . Personal history of radiation therapy    May-July 2018   . Urinary tract infection     Patient Active Problem List   Diagnosis Date Noted  . Allergic rhinitis 02/24/2017  . Malignant neoplasm of upper-outer quadrant of right breast in female, estrogen receptor positive ([place]) 08/24/2016    Past Surgical History:  Procedure Laterality Date  . BREAST LUMPECTOMY Right 2018  . BREAST LUMPECTOMY WITH RADIOACTIVE SEED AND SENTINEL LYMPH NODE BIOPSY Right 08/27/2016   Procedure: RIGHT BREAST LUMPECTOMY WITH RADIOACTIVE SEED AND  SENTINEL LYMPH NODE BIOPSY;  Surgeon: Erroll Luna, MD;  Location: [facility];  Service: General;  Laterality: Right;  . BREAST SURGERY     biopsy  . DILATION AND EVACUATION  04/08/2008; 08/01/2008     OB History   None      Home Medications    Prior to Admission medications   Medication Sig Start Date End Date Taking? Authorizing Provider  ALPRAZolam Duanne Moron) 0.5 MG tablet Take 0.5 mg by mouth at bedtime as needed for anxiety.    [provider]  Cyanocobalamin (B-12 PO) Take 1 Dose by mouth daily.    [provider]  fexofenadine (ALLEGRA) 180 MG tablet Take 180 mg by mouth daily as needed.     [provider]  Melatonin 10 MG TABS Take 1 tablet by mouth at bedtime as needed.    [provider]  Multiple Vitamin (MULTIVITAMIN) tablet Take 1 tablet by mouth daily.    [provider]  Psyllium (METAMUCIL FIBER PO) Take by mouth as needed.    [provider]  tamoxifen (NOLVADEX) 20 MG tablet Take 1 tablet (20 mg total) by mouth daily. 02/25/17   Truitt Merle, MD  vitamin C (ASCORBIC ACID) 500 MG tablet Take 500 mg by mouth daily.    [provider]    Family History Family History  Problem Relation Age of Onset  . Diabetes Mellitus II Father   . Arthritis Father   . Diabetes Father   .  Heart attack Father   . Kidney disease Father   . Stroke Father   . Heart attack Maternal Grandmother   . Heart attack Maternal Grandfather   . Asthma Maternal Grandfather   . COPD Maternal Grandfather   . Early death Maternal Grandfather   . Hearing loss Maternal Grandfather   . Asthma Paternal Grandmother   . Early death Paternal Grandfather   . Breast cancer Neg Hx     Social History Social History   Tobacco Use  . Smoking status: Never Smoker  . Smokeless tobacco: Never Used  Substance Use Topics  . Alcohol use: Yes    Alcohol/week: 0.6 - 1.2 oz    Types: 1 - 2 Glasses of wine per week    Comment: 3  x/week  . Drug use: No     Allergies   Erythromycin   Review of Systems Review of Systems  Gastrointestinal: Positive for abdominal pain, anorexia and nausea.   Ten systems reviewed and are negative for acute change, except as noted in the HPI.    Physical Exam Updated Vital Signs BP (!) 165/90 (BP Location: Right Arm)   Pulse 64   Temp 98.8 F (37.1 C) (Oral)   Resp 17   Ht 5\' 7"  (1.702 m)   Wt 65.3 kg (144 lb)   LMP 08/12/2017   SpO2 99%   BMI 22.55 kg/m   Physical Exam  Constitutional: She is oriented to person, place, and time. She appears well-developed and well-nourished. No distress.  HENT:  Head: Normocephalic and atraumatic.  Eyes: Conjunctivae are normal. No scleral icterus.  Neck: Normal range of motion.  Cardiovascular: Normal rate, regular rhythm and normal heart sounds. Exam reveals no gallop and no friction rub.  No murmur heard. Pulmonary/Chest: Effort normal and breath sounds normal. No respiratory distress.  Abdominal: Soft. Bowel sounds are normal. She exhibits no distension and no mass. There is no tenderness. There is no rigidity, no guarding and no CVA tenderness.  Neurological: She is alert and oriented to person, place, and time.  Skin: Skin is warm and dry. She is not diaphoretic.  Psychiatric: Her behavior is normal.  Nursing note and vitals reviewed.    ED Treatments / Results  Labs (all labs ordered are listed, but only abnormal results are displayed) Labs Reviewed  COMPREHENSIVE METABOLIC PANEL - Abnormal; Notable for the following components:      Result Value   Glucose, Bld 101 (*)    Total Protein 6.2 (*)    Alkaline Phosphatase 34 (*)    All other components within normal limits  LIPASE, BLOOD  CBC  URINALYSIS, ROUTINE W REFLEX MICROSCOPIC  I-STAT BETA HCG BLOOD, ED ([facility], [facility], [facility] ONLY)    EKG None  Radiology No results found.  Procedures Procedures (including critical care time)  Medications Ordered in  ED Medications  ondansetron (ZOFRAN-ODT) disintegrating tablet 4 mg (4 mg Oral Refused 09/07/17 1512)  gi cocktail (Maalox,Lidocaine,Donnatal) (30 mLs Oral Given 09/07/17 2022)     Initial Impression / Assessment and Plan / ED Course  I have reviewed the triage vital signs and the nursing notes.  Pertinent labs & imaging results that were available during my care of the patient were reviewed by me and considered in my medical decision making (see chart for details).     I reviewed the patient's labs which show no significant abnormality, negative urine.  Patient with colicky abdominal pain.  Consider biliary colic.  Her right upper quadrant ultrasound  is negative.  Also considered potential renal colic however given lack of urinary symptoms and no flank pain this is less likely.  Have not therefore pursued any imaging regarding the kidney.  Other considerations to peptic ulcer disease versus acute gastritis.  Patient has not had any return in her symptoms.  Will discharge with Bentyl and Pepcid.  Patient is advised to follow-up with her PCP for further evaluation should she continue to have abdominal pain.  Patient noted to have elevated blood pressures here and advised to follow-up with her PCP.  Final Clinical Impressions(s) / ED Diagnoses   Final diagnoses:  Epigastric abdominal pain    ED Discharge Orders    None       Margarita Mail, PA-C 09/07/17 2234    Duffy Bruce, MD 09/09/17 [phone]

## 2017-09-07 NOTE — Discharge Instructions (Addendum)

## 2017-09-08 ENCOUNTER — Encounter: Payer: Self-pay | Admitting: Hematology

## 2017-09-08 ENCOUNTER — Telehealth: Payer: Self-pay | Admitting: Hematology

## 2017-09-08 NOTE — Telephone Encounter (Signed)
Left message for patient regarding appointment per 11/9 los( this was given to me on 4/24 by Malachy Mood w/ [facility]

## 2017-09-17 ENCOUNTER — Inpatient Hospital Stay (HOSPITAL_BASED_OUTPATIENT_CLINIC_OR_DEPARTMENT_OTHER): Payer: 59 | Admitting: Nurse Practitioner

## 2017-09-17 ENCOUNTER — Encounter: Payer: Self-pay | Admitting: Nurse Practitioner

## 2017-09-17 ENCOUNTER — Telehealth: Payer: Self-pay | Admitting: Nurse Practitioner

## 2017-09-17 ENCOUNTER — Inpatient Hospital Stay: Payer: 59 | Attending: Hematology

## 2017-09-17 VITALS — BP 132/78 | HR 64 | Temp 98.7°F | Resp 16 | Ht 67.0 in | Wt 137.8 lb

## 2017-09-17 DIAGNOSIS — C50411 Malignant neoplasm of upper-outer quadrant of right female breast: Secondary | ICD-10-CM | POA: Insufficient documentation

## 2017-09-17 DIAGNOSIS — Z170 Estrogen receptor positive status [ER+]: Secondary | ICD-10-CM | POA: Diagnosis not present

## 2017-09-17 DIAGNOSIS — Z79810 Long term (current) use of selective estrogen receptor modulators (SERMs): Secondary | ICD-10-CM | POA: Diagnosis not present

## 2017-09-17 DIAGNOSIS — Z8719 Personal history of other diseases of the digestive system: Secondary | ICD-10-CM

## 2017-09-17 LAB — CBC WITH DIFFERENTIAL/PLATELET
Basophils Absolute: 0 10*3/uL (ref 0.0–0.1)
Basophils Relative: 0 %
Eosinophils Absolute: 0.1 10*3/uL (ref 0.0–0.5)
Eosinophils Relative: 2 %
HEMATOCRIT: 39.8 % (ref 34.8–46.6)
HEMOGLOBIN: 13.5 g/dL (ref 11.6–15.9)
LYMPHS ABS: 1.5 10*3/uL (ref 0.9–3.3)
LYMPHS PCT: 26 %
MCH: 32.8 pg (ref 25.1–34.0)
MCHC: 33.9 g/dL (ref 31.5–36.0)
MCV: 96.8 fL (ref 79.5–101.0)
Monocytes Absolute: 0.5 10*3/uL (ref 0.1–0.9)
Monocytes Relative: 8 %
NEUTROS ABS: 3.9 10*3/uL (ref 1.5–6.5)
NEUTROS PCT: 64 %
Platelets: 201 10*3/uL (ref 145–400)
RBC: 4.11 MIL/uL (ref 3.70–5.45)
RDW: 12.9 % (ref 11.2–14.5)
WBC: 6 10*3/uL (ref 3.9–10.3)

## 2017-09-17 LAB — COMPREHENSIVE METABOLIC PANEL
ALT: 15 U/L (ref 0–55)
AST: 22 U/L (ref 5–34)
Albumin: 4.1 g/dL (ref 3.5–5.0)
Alkaline Phosphatase: 41 U/L (ref 40–150)
Anion gap: 6 (ref 3–11)
BUN: 14 mg/dL (ref 7–26)
CHLORIDE: 107 mmol/L (ref 98–109)
CO2: 25 mmol/L (ref 22–29)
Calcium: 9.4 mg/dL (ref 8.4–10.4)
Creatinine, Ser: 1.02 mg/dL (ref 0.60–1.10)
GFR calc non Af Amer: >60 mL/min (ref >60)
Glucose, Bld: 86 mg/dL (ref 70–140)
POTASSIUM: 4.5 mmol/L (ref 3.5–5.1)
Sodium: 138 mmol/L (ref 136–145)
Total Bilirubin: 1.1 mg/dL (ref 0.2–1.2)
Total Protein: 6.7 g/dL (ref 6.4–8.3)

## 2017-09-17 MED ORDER — FAMOTIDINE 20 MG PO TABS: 20.0000 mg | ORAL_TABLET | Freq: Two times a day (BID) | ORAL | 0 refills | Status: DC

## 2017-09-17 NOTE — Progress Notes (Signed)
[facility]  [phone] [phone]  Clinic Follow up Note   Patient Care Team: [name], [name], MD as PCP - General (Family Medicine) Erroll Luna, MD as Consulting Physician (General Surgery) Kyung Rudd, MD as Consulting Physician ([facility]) Truitt Merle, MD as Consulting Physician (Hematology) Gardenia Phlegm, NP as Nurse Practitioner (Hematology and Oncology) 09/17/2017  SUMMARY OF ONCOLOGIC HISTORY: Oncology History   Cancer Staging Malignant neoplasm of upper-outer quadrant of right breast in female, estrogen receptor positive ([place]) Staging form: Breast, AJCC 8th Edition - Pathologic stage from 08/27/2016: Stage IA (pT1a(m), pN0, cM0, G1, ER: Positive, PR: Positive, HER2: Negative) - Signed by Truitt Merle, MD on 09/09/2016 - Pathologic: No stage assigned - Unsigned       Malignant neoplasm of upper-outer quadrant of right breast in female, estrogen receptor positive ([place])   08/05/2016 Mammogram    Targeted ultrasound of the right breast was performed demonstrating an irregular hypoechoic mass at 12:30, 5 cm from the nipple measuring 6 x 4 x 7 mm which is thought to correspond to the area of distortion seen mammographically. Targeted ultrasound of the right axilla demonstrates no suspicious appearing axillary lymph nodes.      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      08/24/2016 Initial Diagnosis    Malignant neoplasm of upper-outer quadrant of right breast in female, estrogen receptor positive ([place])      08/27/2016 Surgery    Right lumpectomy and SLN biopsy, Dr. Brantley Stage       08/27/2016 Pathology Results    Right lumpectomy showed [place] and  LCIS, G1, final margins are negative, all 6 nodes are negative       10/06/2016 - 11/24/2016 Radiation Therapy    With Dr.Moody. 1. The Right breast was treated to 50.4 Gy in 28 fractions at 1.8 Gy per fraction. 2. The Right breast was boosted to 10 Gy in 5 fractions at 2 Gy per fraction.       12/2016 -  Anti-estrogen oral therapy    Tamoxifen daily      08/06/2017 Mammogram    IMPRESSION: 1. No evidence of recurrent or new breast malignancy. 2. Benign postsurgical changes on the right.      CURRENT TREATMENT: Tamoxifen 61m daily started in 12/07/16  INTERVAL HISTORY: Ms. MWilcoxsonreturns for follow up as scheduled. She is feeling well. Has some minor fluctuations in her weight but overall good appetite. Denies fatigue. BM mostly regular, will increase fiber if bowels seem slower. No nausea or vomiting. Menses are irregular, hot flashes less severe and improved overall. She has occasional mild mood swing related to life stress. She exercises often and is occasionally winded with intense activity, denies cough or chest pain. She had a recent episode of acute severe epigastric pain that lasted 8 hours, she went to the ED and was given zofran and GI cocktail. Abdominal [place] obtained. Pain subsided and she was discharged with pepcid and bentyl. She did not fill bentyl, pepcid PRN has helped symptoms; pain has not returned.   REVIEW OF SYSTEMS:   Constitutional: Denies fatigue, fevers, chills  or abnormal weight loss (+) good appetite  Eyes: Denies blurriness of vision Ears, nose, mouth, throat, and face: Denies mucositis or sore throat Respiratory: Denies cough, dyspnea or wheezes Cardiovascular: Denies palpitation, chest discomfort or lower extremity swelling Gastrointestinal:  Denies nausea, vomiting, constipation, diarrhea, heartburn or change in bowel habits (+) 1 episode of epigastric pain, went to ER, subsided with meds and controlled with pepcid (+) regular BM on fiber supplement    GU/GYN: (+) irregular menses  Skin: Denies abnormal skin rashes Lymphatics: Denies new lymphadenopathy or easy bruising Neurological:Denies numbness, tingling or new weaknesses (+) mild hot flashes Behavioral/Psych: Mood is stable, no new changes (+) occasional mood swing  All other systems were reviewed with the patient and are negative.  MEDICAL HISTORY:  Past Medical History:  Diagnosis Date  . Allergy   . Breast cancer ([place]) 08/2016   right  . Chicken pox   . Dental crown present   . HIV infection ([place])    TESTING FOR HIV  . Personal history of radiation therapy    May-July 2018   . Urinary tract infection     SURGICAL HISTORY: Past Surgical History:  Procedure Laterality Date  . BREAST LUMPECTOMY Right 2018  . BREAST LUMPECTOMY WITH RADIOACTIVE SEED AND SENTINEL LYMPH NODE BIOPSY Right 08/27/2016   Procedure: RIGHT BREAST LUMPECTOMY WITH RADIOACTIVE SEED AND SENTINEL LYMPH NODE BIOPSY;  Surgeon: Erroll Luna, MD;  Location: [facility];  Service: General;  Laterality: Right;  . BREAST SURGERY     biopsy  . DILATION AND EVACUATION  04/08/2008; 08/01/2008    I have reviewed the social history and family history with the patient and they are unchanged from previous note.  ALLERGIES:  is allergic to erythromycin.  MEDICATIONS:  Current Outpatient Medications  Medication Sig Dispense Refill  . Cyanocobalamin (B-12 PO) Take 1 tablet by mouth daily.     . famotidine (PEPCID) 20 MG tablet Take 1 tablet (20 mg total) by mouth 2 (two) times daily. 60 tablet 0  . fexofenadine (ALLEGRA) 180 MG tablet Take 180 mg by mouth daily as needed for allergies.     . Melatonin 10 MG TABS Take 1 tablet by mouth at bedtime.     . Multiple Vitamin (MULTIVITAMIN) tablet Take 1 tablet by mouth daily.    . Psyllium (METAMUCIL FIBER PO) Take 2 capsules by mouth daily.     . tamoxifen (NOLVADEX) 20 MG tablet TAKE 1 TABLET BY MOUTH EVERY DAY 30 tablet 2  . vitamin C (ASCORBIC  ACID) 500 MG tablet Take 500 mg by mouth daily.    Marland Kitchen ALPRAZolam (XANAX) 0.5 MG tablet Take 0.5 mg by mouth at bedtime as needed for anxiety.    . dicyclomine (BENTYL) 20 MG tablet Take 1 tablet (20 mg total) by mouth 2 (two) times daily. 20 tablet 0   No current facility-administered medications for this visit.     PHYSICAL EXAMINATION: ECOG PERFORMANCE STATUS: 1 - Symptomatic but completely ambulatory  Vitals:   09/17/17 0819  BP: 132/78  Pulse: 64  Resp: 16  Temp: 98.7 F (37.1 C)  SpO2: 100%   Filed Weights   09/17/17 0819  Weight: 137 lb 12.8 oz (62.5 kg)    GENERAL:alert, no distress and comfortable SKIN: skin color, texture, turgor are normal, no rashes or significant lesions EYES: normal, Conjunctiva are pink and non-injected, sclera clear OROPHARYNX:no exudate, no erythema and lips, buccal mucosa, and tongue normal  LYMPH:  no palpable cervical or  supraclavicular lymphadenopathy LUNGS: clear to auscultation with normal breathing effort HEART: regular rate & rhythm and no murmurs and no lower extremity edema ABDOMEN:abdomen soft, non-tender and normal bowel sounds Musculoskeletal:no cyanosis of digits and no clubbing  NEURO: alert & oriented x 3 with fluent speech, no focal motor/sensory deficits BREAST: inspection shows them to be symmetrical without nipple discharge or inversion. (+) right breast slightly hyperpigmented. Axillary and breast incisions are well healed. No palpable mass in right breast or axilla that I could appreciate. (+) Left breast and axilla benign   LABORATORY DATA:  I have reviewed the data as listed CBC Latest Ref Rng & Units 09/17/2017 09/07/2017 03/26/2017  WBC 3.9 - 10.3 K/uL 6.0 5.8 4.3  Hemoglobin 11.6 - 15.9 g/dL 13.5 13.0 13.3  Hematocrit 34.8 - 46.6 % 39.8 38.4 38.6  Platelets 145 - 400 K/uL 201 220 189     CMP Latest Ref Rng & Units 09/17/2017 09/07/2017 03/26/2017  Glucose 70 - 140 mg/dL 86 101(H) 88  BUN 7 - 26 mg/dL 14 14 9.8    Creatinine 0.60 - 1.10 mg/dL 1.02 0.93 0.8  Sodium 136 - 145 mmol/L 138 138 139  Potassium 3.5 - 5.1 mmol/L 4.5 3.8 4.1  Chloride 98 - 109 mmol/L 107 104 -  CO2 22 - 29 mmol/L 25 23 24   Calcium 8.4 - 10.4 mg/dL 9.4 9.2 8.9  Total Protein 6.4 - 8.3 g/dL 6.7 6.2(L) 6.7  Total Bilirubin 0.2 - 1.2 mg/dL 1.1 1.0 0.92  Alkaline Phos 40 - 150 U/L 41 34(L) 41  AST 5 - 34 U/L 22 25 16   ALT 0 - 55 U/L 15 19 17    Surgical Pathology  08/27/16 1. Breast, lumpectomy, Right - INVASIVE LOBULAR CARCINOMA, GRADE I/III, TWO FOCI SPANNING 0.5 CM AND A SEPARATE MICROSCOPIC FOCUS. - LOBULAR CARCINOMA IN SITU. - THE SURGICAL RESECTION MARGINS ARE NEGATIVE FOR INVASIVE CARCINOMA. - SEE ONCOLOGY TABLE BELOW. 2. Breast, excision, Right new medial margin - BENIGN BREAST PARENCHYMA. - THERE IS NO EVIDENCE OF MALIGNANCY. - SEE COMMENT. 3. Lymph node, sentinel, biopsy, Right axillary - THERE IS NO EVIDENCE OF CARCINOMA IN 1 OF 1 LYMPH NODE (0/1). - SEE COMMENT. 4. Lymph node, sentinel, biopsy, Right axillary - THERE IS NO EVIDENCE OF CARCINOMA IN 1 OF 1 LYMPH NODE (0/1). - SEE COMMENT. 5. Lymph node, sentinel, biopsy, Right axillary - THERE IS NO EVIDENCE OF CARCINOMA IN 1 OF 1 LYMPH NODE (0/1). - SEE COMMENT. 6. Lymph node, sentinel, biopsy, Right axillary - THERE IS NO EVIDENCE OF CARCINOMA IN 1 OF 1 LYMPH NODE (0/1). - SEE COMMENT. 7. Lymph node, sentinel, biopsy, Right axillary - THERE IS NO EVIDENCE OF CARCINOMA IN 1 OF 1 LYMPH NODE (0/1). - SEE COMMENT. 8. Lymph node, sentinel, biopsy, Right axillary - THERE IS NO EVIDENCE OF CARCINOMA IN 1 OF 1 LYMPH NODE (0/1). - SEE COMMENT.  08/06/16 Breast, right, needle core biopsy, 12:30 o'clock - INVASIVE MAMMARY CARCINOMA, SEE COMMENT. - LOBULAR NEOPLASIA (ATYPICAL LOBULAR HYPERPLASIA).    RADIOGRAPHIC STUDIES: I have personally reviewed the radiological images as listed and agreed with the findings in the report. No results found.    ASSESSMENT & PLAN: Almarosa Bohac is a 47 y.o. premenopausal female with a recent diagnosis of Stage [id number] invasive lobular carcinoma of the UOQ of right breast, ER/PR positive, Her2 negative, Ki67 10%, Grade I.    1. Malignant neoplasm of upper-outer quadrant of right breast, stage IA (pT1bN0M0), Invasive Lobular Carcinoma, ER/PR  Positive, Her2 Negative, Grade I  Ms. Sandler appears stable. She continues daily tamoxifen with mild hot flashes, mood swing, and irregular menses; she tolerated it well overall. She is clinically doing well. CBC and CMP stable. Physical exam was unremarkable. No clinical concerns for recurrence. 07/2017 mammogram was negative for signs of recurrence; I reviewed with her. She will continue tamoxifen daily and annual mammogram due 07/2018. Continue breast cancer surveillance. Lab and f/u with Dr. Burr Medico in 4-6 months.   2. Epigastric pain She required ER evaluation due to acute epigastric pain on 09/07/17. Labs were unremarkable. RUQ [place] was negative. She was given zofran and GI cocktail and symptoms subsided. She takes pepcid PRN. Symptoms have not returned. I suggest she f/u with PCP. I refilled pepcid for 1 month for her to make PCP appointment.   2. Upper right arm pain associated with surgery  -Resolved   PLAN -Labs and imaging reviewed, continue breast cancer surveillance -Continue daily tamoxifen  -Annual mammogram due 07/2018 -Refilled pepcid -F/u with PCP  -Lab, f/u with Dr. Burr Medico in 4-6 months   All questions were answered. The patient knows to call the clinic with any problems, questions or concerns. No barriers to learning was detected.     Alla Feeling, NP 09/17/17

## 2017-09-17 NOTE — Telephone Encounter (Signed)
Scheduled appt per 5/3 los - pt aware - my chart active - per patient did not want avs or calender printed .

## 2017-10-14 ENCOUNTER — Other Ambulatory Visit: Payer: Self-pay | Admitting: Nurse Practitioner

## 2017-10-14 DIAGNOSIS — Z8719 Personal history of other diseases of the digestive system: Secondary | ICD-10-CM

## 2017-10-22 ENCOUNTER — Other Ambulatory Visit: Payer: Self-pay | Admitting: Nurse Practitioner

## 2017-10-22 DIAGNOSIS — Z8719 Personal history of other diseases of the digestive system: Secondary | ICD-10-CM

## 2017-10-22 DIAGNOSIS — Z87898 Personal history of other specified conditions: Secondary | ICD-10-CM

## 2017-10-29 ENCOUNTER — Other Ambulatory Visit: Payer: Self-pay | Admitting: Nurse Practitioner

## 2017-10-29 DIAGNOSIS — Z8719 Personal history of other diseases of the digestive system: Secondary | ICD-10-CM

## 2017-10-29 MED ORDER — FAMOTIDINE 20 MG PO TABS: 20.0000 mg | ORAL_TABLET | Freq: Two times a day (BID) | ORAL | 0 refills | Status: DC

## 2017-10-30 ENCOUNTER — Other Ambulatory Visit: Payer: Self-pay | Admitting: Hematology

## 2017-10-30 DIAGNOSIS — C50911 Malignant neoplasm of unspecified site of right female breast: Secondary | ICD-10-CM

## 2017-11-30 ENCOUNTER — Other Ambulatory Visit: Payer: Self-pay | Admitting: Nurse Practitioner

## 2017-11-30 DIAGNOSIS — C50911 Malignant neoplasm of unspecified site of right female breast: Secondary | ICD-10-CM

## 2017-12-03 ENCOUNTER — Other Ambulatory Visit: Payer: Self-pay | Admitting: Emergency Medicine

## 2017-12-03 DIAGNOSIS — C50911 Malignant neoplasm of unspecified site of right female breast: Secondary | ICD-10-CM

## 2017-12-03 MED ORDER — TAMOXIFEN CITRATE 20 MG PO TABS: 20.0000 mg | ORAL_TABLET | Freq: Every day | ORAL | 1 refills | Status: DC

## 2017-12-11 ENCOUNTER — Encounter: Payer: Self-pay | Admitting: Hematology

## 2018-01-31 ENCOUNTER — Ambulatory Visit (INDEPENDENT_AMBULATORY_CARE_PROVIDER_SITE_OTHER): Payer: 59 | Admitting: Family Medicine

## 2018-01-31 ENCOUNTER — Encounter: Payer: Self-pay | Admitting: Family Medicine

## 2018-01-31 VITALS — BP 118/70 | HR 60 | Temp 98.5°F | Resp 12 | Ht 67.0 in | Wt 142.4 lb

## 2018-01-31 DIAGNOSIS — R0981 Nasal congestion: Secondary | ICD-10-CM | POA: Diagnosis not present

## 2018-01-31 DIAGNOSIS — J069 Acute upper respiratory infection, unspecified: Secondary | ICD-10-CM

## 2018-01-31 DIAGNOSIS — J309 Allergic rhinitis, unspecified: Secondary | ICD-10-CM

## 2018-01-31 MED ORDER — PREDNISONE 20 MG PO TABS: 40.0000 mg | ORAL_TABLET | Freq: Every day | ORAL | 0 refills | Status: AC

## 2018-01-31 MED ORDER — MOMETASONE FUROATE 50 MCG/ACT NA SUSP: 2.0000 | Freq: Every day | NASAL | 2 refills | Status: DC

## 2018-01-31 MED ORDER — BENZONATATE 100 MG PO CAPS: 200.0000 mg | ORAL_CAPSULE | Freq: Two times a day (BID) | ORAL | 0 refills | Status: DC | PRN

## 2018-01-31 NOTE — Patient Instructions (Signed)
A few things to remember from today's visit:   URI, acute - Plan: mometasone (NASONEX) 50 MCG/ACT nasal spray, benzonatate (TESSALON) 100 MG capsule  Allergic rhinitis, unspecified seasonality, unspecified trigger - Plan: mometasone (NASONEX) 50 MCG/ACT nasal spray, predniSONE (DELTASONE) 20 MG tablet  Nasal sinus congestion - Plan: predniSONE (DELTASONE) 20 MG tablet   viral infections are self-limited and we treat each symptom depending of severity.  Over the counter medications as decongestants and cold medications usually help, they need to be taken with caution if there is a history of high blood pressure or palpitations. Tylenol and/or Ibuprofen also helps with most symptoms (headache, muscle aching, fever,etc) Plenty of fluids. Honey helps with cough. Steam inhalations helps with runny nose, nasal congestion, and may prevent sinus infections. Cough and nasal congestion could last a few days and sometimes weeks. Please follow in not any better in 10-14 days or if symptoms get worse.  Voice rest to help with hoarseness. Popping ears a few times during the day may help.    Please be sure medication list is accurate. If a new problem present, please set up appointment sooner than planned today.

## 2018-01-31 NOTE — Progress Notes (Signed)
ACUTE VISIT  HPI:  Chief Complaint  Patient presents with  . Sinusitis    sx started last Thursday    [name] is a 47 y.o.female here today complaining of 4 days of respiratory symptoms. Initially she had scratchy throat. Nasal congestion, rhinorrhea, postnasal drainage, the latter one is causing throat irritation. Productive cough with greenish sputum. She denies hemoptysis. No chest pain, dyspnea, or wheezing.  No changes in appetite, fever, chills, or body aches. Dysphonia, worse at the end of the day after frequent talking.   Bilateral ear fullness sensation. No ear ache or drainage.  URI   This is a new problem. The current episode started in the past 7 days. The problem has been unchanged. There has been no fever. Associated symptoms include congestion, coughing, a plugged ear sensation, rhinorrhea and a sore throat. Pertinent negatives include no abdominal pain, chest pain, diarrhea, ear pain, headaches, joint swelling, nausea, neck pain, rash, sinus pain, sneezing, swollen glands, vomiting or wheezing. She has tried decongestant for the symptoms. The treatment provided mild relief.    No Hx of recent travel. No sick contact. No known insect bite.  Hx of allergies: Yes.  Allergic rhinitis, currently she is on Allegra 180 mg daily.  OTC medications for this problem: Mucinex and other cold medications at night to help her sleep.    Review of Systems  Constitutional: Positive for fatigue. Negative for activity change, appetite change and fever.  HENT: Positive for congestion, postnasal drip, rhinorrhea, sinus pressure, sore throat and voice change. Negative for ear pain, facial swelling, hearing loss, mouth sores, sinus pain, sneezing and trouble swallowing.   Eyes: Negative for discharge, redness and itching.  Respiratory: Positive for cough. Negative for chest tightness, shortness of breath and wheezing.   Cardiovascular: Negative for chest  pain.  Gastrointestinal: Negative for abdominal pain, diarrhea, nausea and vomiting.  Musculoskeletal: Negative for myalgias and neck pain.  Skin: Negative for rash.  Allergic/Immunologic: Positive for environmental allergies.  Neurological: Negative for syncope, weakness and headaches.  Hematological: Negative for adenopathy.      Current Outpatient Medications on File Prior to Visit  Medication Sig Dispense Refill  . ALPRAZolam (XANAX) 0.5 MG tablet Take 0.5 mg by mouth at bedtime as needed for anxiety.    . Cyanocobalamin (B-12 PO) Take 1 tablet by mouth daily.     . fexofenadine (ALLEGRA) 180 MG tablet Take 180 mg by mouth daily as needed for allergies.     . Melatonin 10 MG TABS Take 1 tablet by mouth at bedtime.     . Multiple Vitamin (MULTIVITAMIN) tablet Take 1 tablet by mouth daily.    . Psyllium (METAMUCIL FIBER PO) Take 2 capsules by mouth daily.     . tamoxifen (NOLVADEX) 20 MG tablet Take 1 tablet (20 mg total) by mouth daily. 90 tablet 1  . vitamin C (ASCORBIC ACID) 500 MG tablet Take 500 mg by mouth daily.     No current facility-administered medications on file prior to visit.      Past Medical History:  Diagnosis Date  . Allergy   . Breast cancer ([place]) 08/2016   right  . Chicken pox   . Dental crown present   . HIV infection ([place])    TESTING FOR HIV  . Personal history of radiation therapy    May-July 2018   . Urinary tract infection    Allergies  Allergen Reactions  . Erythromycin Nausea And Vomiting  Social History   Socioeconomic History  . Marital status: Divorced    Spouse name: Not on file  . Number of children: Not on file  . Years of education: Not on file  . Highest education level: Not on file  Occupational History  . Not on file  Social Needs  . Financial resource strain: Not on file  . Food insecurity:    Worry: Not on file    Inability: Not on file  . Transportation needs:    Medical: Not on file    Non-medical: Not on file    Tobacco Use  . Smoking status: Never Smoker  . Smokeless tobacco: Never Used  Substance and Sexual Activity  . Alcohol use: Yes    Alcohol/week: 1.0 - 2.0 standard drinks    Types: 1 - 2 Glasses of wine per week    Comment: 3 x/week  . Drug use: No  . Sexual activity: Not on file  Lifestyle  . Physical activity:    Days per week: Not on file    Minutes per session: Not on file  . Stress: Not on file  Relationships  . Social connections:    Talks on phone: Not on file    Gets together: Not on file    Attends religious service: Not on file    Active member of club or organization: Not on file    Attends meetings of clubs or organizations: Not on file    Relationship status: Not on file  Other Topics Concern  . Not on file  Social History Narrative  . Not on file    Vitals:   01/31/18 1544  BP: 118/70  Pulse: 60  Resp: 12  Temp: 98.5 F (36.9 C)  SpO2: 99%   Body mass index is 22.3 kg/m.    Physical Exam  Nursing note reviewed. Constitutional: She is oriented to person, place, and time. She appears well-developed and well-nourished. She does not appear ill. No distress.  HENT:  Head: Atraumatic.  Right Ear: External ear and ear canal normal. Tympanic membrane is not erythematous. A middle ear effusion is present.  Left Ear: Tympanic membrane, external ear and ear canal normal.  Nose: Rhinorrhea present. Right sinus exhibits no maxillary sinus tenderness and no frontal sinus tenderness. Left sinus exhibits no maxillary sinus tenderness and no frontal sinus tenderness.  Mouth/Throat: Oropharynx is clear and moist and mucous membranes are normal.  Hypertrophic [place]. Postnasal drainage. Mild dysphonia.  Eyes: Conjunctivae are normal.  Neck: No muscular tenderness present. No edema and no erythema present.  Cardiovascular: Normal rate and regular rhythm.  No murmur heard. Respiratory: Effort normal and breath sounds normal. No stridor. No respiratory distress.   Lymphadenopathy:       Head (right side): No submandibular adenopathy present.       Head (left side): No submandibular adenopathy present.    She has no cervical adenopathy.  Neurological: She is alert and oriented to person, place, and time. She has normal strength.  Skin: Skin is warm. No rash noted. No erythema.  Psychiatric: Her mood appears anxious.  Well groomed, good eye contact.    ASSESSMENT AND PLAN:  [name] was seen today for sinusitis.  Diagnoses and all orders for this visit:  URI, acute  Symptoms suggests a viral etiology, I explained patient that symptomatic treatment is usually recommended in this case, so I do not think abx is needed at this time. Instructed to monitor for signs of complications, including  new onset of fever among some, clearly instructed about warning signs. I also explained that cough and nasal congestion can last a few days and sometimes weeks. F/U as needed.  -     mometasone (NASONEX) 50 MCG/ACT nasal spray; Place 2 sprays into the nose daily. -     benzonatate (TESSALON) 100 MG capsule; Take 2 capsules (200 mg total) by mouth 2 (two) times daily as needed for cough.  Allergic rhinitis, unspecified seasonality, unspecified trigger  This problem could be contributing to her symptoms. Recommend nasal irrigation with saline several times per day. Nasonex intranasal spray daily as needed.   -     mometasone (NASONEX) 50 MCG/ACT nasal spray; Place 2 sprays into the nose daily. -     predniSONE (DELTASONE) 20 MG tablet; Take 2 tablets (40 mg total) by mouth daily with breakfast for 3 days.  Nasal sinus congestion Reassured in regard to bacteria sinus infection. Short course of prednisone may help with nasal congestion.  Side effects discussed. Plain Mucinex may help. Instructed about warning signs. Follow-up as needed.   -     predniSONE (DELTASONE) 20 MG tablet; Take 2 tablets (40 mg total) by mouth daily with breakfast for 3  days.       Daden Mahany G. [name], MD  [facility]. [place] office.

## 2018-02-01 ENCOUNTER — Telehealth: Payer: Self-pay | Admitting: *Deleted

## 2018-02-01 NOTE — Telephone Encounter (Signed)
Requesting alternative for Mometasone Furoate 50 Mcg spray, insurance does not cover. Suggested alternatives: Fluticasone pro 50 mcg spray, FLUNISOLIDE 0.025% SPRAY, OR ZETONNA 37 MCG NASAL SPRAY

## 2018-02-02 ENCOUNTER — Other Ambulatory Visit: Payer: Self-pay | Admitting: Family Medicine

## 2018-02-02 MED ORDER — CICLESONIDE 37 MCG/ACT NA AERS: 1.0000 | INHALATION_SPRAY | Freq: Every day | NASAL | 1 refills | Status: DC | PRN

## 2018-02-02 NOTE — Telephone Encounter (Signed)
Prescription for Zetonna nasal spray was sent to her pharmacy, 1 spray in each nostril once daily as needed. Thanks, BJ

## 2018-02-15 NOTE — Progress Notes (Signed)
[place]  [phone]) [phone] [phone]  Clinic Follow Up Note   Patient Care Team: [name], Janice G, MD as PCP - General (Family Medicine) Janice Luna, MD as Consulting Physician (General Surgery) Janice Rudd, MD as Consulting Physician ([place]) Janice Merle, MD as Consulting Physician (Hematology) Janice Phlegm, NP as Nurse Practitioner (Hematology and Oncology)   Date of Service:  02/18/2018   CHIEF COMPLAINTS:  Follow up Right Breast Invasive Lobular Carcinoma, ER/PR Positive, Her2 Negative, Pope IA  Oncology History   Cancer Staging Malignant neoplasm of upper-outer quadrant of right breast in female, estrogen receptor positive ([place]) Staging form: Breast, AJCC 8th Edition - Pathologic Pope from 08/27/2016: Pope IA (pT1a(m), pN0, cM0, G1, ER: Positive, PR: Positive, HER2: Negative) - Signed by Janice Merle, MD on 09/09/2016 - Pathologic: No Pope assigned - Unsigned       Malignant neoplasm of upper-outer quadrant of right breast in female, estrogen receptor positive ([place])   08/05/2016 Mammogram    Targeted ultrasound of the right breast was performed demonstrating an irregular hypoechoic mass at 12:30, 5 cm from the nipple measuring 6 x 4 x 7 mm which is thought to correspond to the area of distortion seen mammographically. Targeted ultrasound of the right axilla demonstrates no suspicious appearing axillary lymph nodes.    08/06/2016 Receptors her2    ER 100%+, PR 90%+, HER2-, Ki67 10%    08/06/2016 Initial Biopsy    Right breast 12:30 biopsy showed invasive lobular carcinoma and LCIS    08/19/2016 Imaging    breast MRI showed a 7 x 6 x 7 mm enhancing mass in the posterior third of the upper inner quadrant of the right breast.    08/24/2016 Initial Diagnosis    Malignant neoplasm of upper-outer quadrant of right breast in female, estrogen receptor positive ([place])    08/27/2016 Surgery    Right lumpectomy and SLN  biopsy, Dr. Brantley Pope     08/27/2016 Pathology Results    Right lumpectomy showed [place] and LCIS, G1, final margins are negative, all 6 nodes are negative     10/06/2016 - 11/24/2016 Radiation Therapy    With Dr.Moody. 1. The Right breast was treated to 50.4 Gy in 28 fractions at 1.8 Gy per fraction. 2. The Right breast was boosted to 10 Gy in 5 fractions at 2 Gy per fraction.     12/2016 -  Anti-estrogen oral therapy    Tamoxifen daily    08/06/2017 Mammogram    IMPRESSION: 1. No evidence of recurrent or new breast malignancy. 2. Benign postsurgical changes on the right.       HISTORY OF PRESENTING ILLNESS (09/10/16):  [name] 47 y.o. female is here because of a recent diagnosis of right breast cancer.  She presents to the clinic today and was referred by Dr. Brantley Pope. This was discovered by Screening mammogram. No abnormal feelings during the finding. She has been yearly to get mammogram. Patient feels the surgery went well. She reports a mild seroma that was drained earlier this week by Dr. Brantley Pope. She is having nerve pain on right arm. She is almost able to raise right arm all the way. Considering Physical Therapy for arm. Stopped Vicodin for pain and instead was using 3 Advil 3 times a day. Have not taken Xanax since biopsy "only as emergency".   She reports she stopped taking birth control and she went Perimenopausal. She started taking Soy estrogen to alleviate  symptoms. She stopped taking this  3 weeks ago and so far no symptoms of Perimenopausal coming back. She feels she is handling things well.   On 4/26 her BP was high but she reports it is normally in range at home. No history of cancer in family but has history of other chronic diseases. Has a job that is stressful but manages it well. She exercises several times a week. PCP Is Dr. Alessandra Pope. The patient reports she has not seen her PCP recently for routine lab work. Has a gynecologist Dr. Julien Pope.   GYN HISTORY    Menarchal: Not reported LMP: a few years ago  Contraceptive: stopped and went perimenopausal HRT: No GP: G2P0, She had one miscarriage and one abortion due to no fetal heart beat.    CURRENT TREATMENT: Tamoxifen '20mg'$  daily started in 12/07/16  INTERIM HISTORY:  [name] is here for a follow up of her right breast cancer. She was last seen by me in 03/2017. In interim she was seen by NP [name] in 09/2017. She presents to the clinic today by herself.  She notes she had abdominal pain severe enough to present to ED in 08/2017. She was given Pepcid and pain had not returned.   She notes she is tolerating Tamoxifen, but notes intermittent hot flashes still. She notes she has had change in vision, close up and far away, but not sue if this is related to age or Tamoxifen. She denies having new developments of cataracts. She will continue to follow up with her eye doctor. She notes temporary stiffness in her hips but has subsided while she remains active.   Her last period was mid 07/2017. Her period before starting Tamoxifen was sporadic as she was on birth control.  She will continue to follow up with her Gyn and PCP.      MEDICAL HISTORY:  Past Medical History:  Diagnosis Date  . Allergy   . Breast cancer ([place]) 08/2016   right  . Chicken pox   . Dental crown present   . HIV infection ([place])    TESTING FOR HIV  . Personal history of radiation therapy    May-July 2018   . Urinary tract infection     SURGICAL HISTORY: Past Surgical History:  Procedure Laterality Date  . BREAST LUMPECTOMY Right 2018  . BREAST LUMPECTOMY WITH RADIOACTIVE SEED AND SENTINEL LYMPH NODE BIOPSY Right 08/27/2016   Procedure: RIGHT BREAST LUMPECTOMY WITH RADIOACTIVE SEED AND SENTINEL LYMPH NODE BIOPSY;  Surgeon: Janice Luna, MD;  Location: [facility];  Service: General;  Laterality: Right;  . BREAST SURGERY     biopsy  . DILATION AND EVACUATION  04/08/2008; 08/01/2008    SOCIAL  HISTORY: Social History   Socioeconomic History  . Marital status: Divorced    Spouse name: Not on file  . Number of children: Not on file  . Years of education: Not on file  . Highest education level: Not on file  Occupational History  . Not on file  Social Needs  . Financial resource strain: Not on file  . Food insecurity:    Worry: Not on file    Inability: Not on file  . Transportation needs:    Medical: Not on file    Non-medical: Not on file  Tobacco Use  . Smoking status: Never Smoker  . Smokeless tobacco: Never Used  Substance and Sexual Activity  . Alcohol use: Yes    Alcohol/week: 1.0 - 2.0 standard drinks    Types: 1 -  2 Glasses of wine per week    Comment: 3 x/week  . Drug use: No  . Sexual activity: Not on file  Lifestyle  . Physical activity:    Days per week: Not on file    Minutes per session: Not on file  . Stress: Not on file  Relationships  . Social connections:    Talks on phone: Not on file    Gets together: Not on file    Attends religious service: Not on file    Active member of club or organization: Not on file    Attends meetings of clubs or organizations: Not on file    Relationship status: Not on file  . Intimate partner violence:    Fear of current or ex partner: Not on file    Emotionally abused: Not on file    Physically abused: Not on file    Forced sexual activity: Not on file  Other Topics Concern  . Not on file  Social History Narrative  . Not on file    FAMILY HISTORY: Family History  Problem Relation Age of Onset  . Diabetes Mellitus II Father   . Arthritis Father   . Diabetes Father   . Heart attack Father   . Kidney disease Father   . Stroke Father   . Heart attack Maternal Grandmother   . Heart attack Maternal Grandfather   . Asthma Maternal Grandfather   . COPD Maternal Grandfather   . Early death Maternal Grandfather   . Hearing loss Maternal Grandfather   . Asthma Paternal Grandmother   . Early death  Paternal Grandfather   . Breast cancer Neg Hx     ALLERGIES:  is allergic to erythromycin.  MEDICATIONS:  Current Outpatient Medications  Medication Sig Dispense Refill  . ALPRAZolam (XANAX) 0.5 MG tablet Take 0.5 mg by mouth at bedtime as needed for anxiety.    . Cyanocobalamin (B-12 PO) Take 1 tablet by mouth daily.     . fexofenadine (ALLEGRA) 180 MG tablet Take 180 mg by mouth daily as needed for allergies.     . Melatonin 10 MG TABS Take 1 tablet by mouth at bedtime.     . Multiple Vitamin (MULTIVITAMIN) tablet Take 1 tablet by mouth daily.    . Psyllium (METAMUCIL FIBER PO) Take 2 capsules by mouth daily.     . tamoxifen (NOLVADEX) 20 MG tablet Take 1 tablet (20 mg total) by mouth daily. 90 tablet 1  . vitamin C (ASCORBIC ACID) 500 MG tablet Take 500 mg by mouth daily.    . Ciclesonide 37 MCG/ACT AERS Place 1 spray into the nose daily as needed. (Patient not taking: Reported on 02/18/2018) 1 Inhaler 1   No current facility-administered medications for this visit.     REVIEW OF SYSTEMS:  Constitutional: Denies fevers, chills or abnormal night sweats (+) intermittent hot flashes, tolerable  Eyes: Denies blurriness of vision, double vision or watery eyes (+) vision change Ears, nose, mouth, throat, and face: Denies mucositis or sore throat Respiratory: Denies cough, dyspnea or wheezes Cardiovascular: Denies palpitation, chest discomfort or lower extremity swelling Gastrointestinal:  Denies nausea, heartburn or change in bowel habits Skin: Denies abnormal skin rashes Lymphatics: Denies new lymphadenopathy or easy bruising Neurological:Denies numbness, tingling or new weaknesses Behavioral/Psych: Mood is stable, no new changes   BREAST: (+) Numbness in right axilla  All other systems were reviewed with the patient and are negative.  PHYSICAL EXAMINATION:  ECOG PERFORMANCE STATUS: 0 - Asymptomatic  Vitals:   02/18/18 0829  BP: 122/90  Pulse: 60  Resp: 18  Temp: 97.9 F  (36.6 C)  SpO2: 100%   Filed Weights   02/18/18 0829  Weight: 136 lb 3.2 oz (61.8 kg)    GENERAL:alert, no distress and comfortable SKIN: skin color, texture, turgor are normal, no rashes or significant lesions EYES: normal, conjunctiva are pink and non-injected, sclera clear OROPHARYNX:no exudate, no erythema and lips, buccal mucosa, and tongue normal  NECK: supple, thyroid normal size, non-tender, without nodularity LYMPH:  no palpable lymphadenopathy in the cervical, axillary or inguinal LUNGS: clear to auscultation and percussion with normal breathing effort HEART: regular rate & rhythm and no murmurs and no lower extremity edema ABDOMEN:abdomen soft, non-tender and normal bowel sounds Musculoskeletal:no cyanosis of digits and no clubbing  PSYCH: alert & oriented x 3 with fluent speech NEURO: no focal motor/sensory deficits Breast: surgical excision in right upper mid-breast is healed well, mild skin pigmentation of the right breast secondary to radiation, almost back to normal skin color. Right aurora slightly bigger than the left, no significant lymphedema. Palpitation of the bilateral breast and axilla showed no mass or adenopathy.  LABORATORY DATA:  I have reviewed the data as listed CBC Latest Ref Rng & Units 02/18/2018 09/17/2017 09/07/2017  WBC 3.9 - 10.3 K/uL 4.2 6.0 5.8  Hemoglobin 11.6 - 15.9 Pope/dL 14.3 13.5 13.0  Hematocrit 34.8 - 46.6 % 41.3 39.8 38.4  Platelets 145 - 400 K/uL 201 201 220   CMP Latest Ref Rng & Units 09/17/2017 09/07/2017 03/26/2017  Glucose 70 - 140 mg/dL 86 101(H) 88  BUN 7 - 26 mg/dL 14 14 9.8  Creatinine 0.60 - 1.10 mg/dL 1.02 0.93 0.8  Sodium 136 - 145 mmol/L 138 138 139  Potassium 3.5 - 5.1 mmol/L 4.5 3.8 4.1  Chloride 98 - 109 mmol/L 107 104 -  CO2 22 - 29 mmol/L 25 23 24   Calcium 8.4 - 10.4 mg/dL 9.4 9.2 8.9  Total Protein 6.4 - 8.3 Pope/dL 6.7 6.2(L) 6.7  Total Bilirubin 0.2 - 1.2 mg/dL 1.1 1.0 0.92  Alkaline Phos 40 - 150 U/L 41 34(L) 41  AST  5 - 34 U/L 22 25 16   ALT 0 - 55 U/L 15 19 17     Surgical Pathology  08/27/16 1. Breast, lumpectomy, Right - INVASIVE LOBULAR CARCINOMA, GRADE I/III, TWO FOCI SPANNING 0.5 CM AND A SEPARATE MICROSCOPIC FOCUS. - LOBULAR CARCINOMA IN SITU. - THE SURGICAL RESECTION MARGINS ARE NEGATIVE FOR INVASIVE CARCINOMA. - SEE ONCOLOGY TABLE BELOW. 2. Breast, excision, Right new medial margin - BENIGN BREAST PARENCHYMA. - THERE IS NO EVIDENCE OF MALIGNANCY. - SEE COMMENT. 3. Lymph node, sentinel, biopsy, Right axillary - THERE IS NO EVIDENCE OF CARCINOMA IN 1 OF 1 LYMPH NODE (0/1). - SEE COMMENT. 4. Lymph node, sentinel, biopsy, Right axillary - THERE IS NO EVIDENCE OF CARCINOMA IN 1 OF 1 LYMPH NODE (0/1). - SEE COMMENT. 5. Lymph node, sentinel, biopsy, Right axillary - THERE IS NO EVIDENCE OF CARCINOMA IN 1 OF 1 LYMPH NODE (0/1). - SEE COMMENT. 6. Lymph node, sentinel, biopsy, Right axillary - THERE IS NO EVIDENCE OF CARCINOMA IN 1 OF 1 LYMPH NODE (0/1). - SEE COMMENT. 7. Lymph node, sentinel, biopsy, Right axillary - THERE IS NO EVIDENCE OF CARCINOMA IN 1 OF 1 LYMPH NODE (0/1). - SEE COMMENT. 8. Lymph node, sentinel, biopsy, Right axillary - THERE IS NO EVIDENCE OF CARCINOMA IN 1 OF 1 LYMPH NODE (0/1). - SEE COMMENT.  08/06/16 Breast, right, needle core biopsy, 12:30 o'clock - INVASIVE MAMMARY CARCINOMA, SEE COMMENT. - LOBULAR NEOPLASIA (ATYPICAL LOBULAR HYPERPLASIA).  RADIOGRAPHIC STUDIES: I have personally reviewed the radiological images as listed and agreed with the findings in the report. No results found.   Diagnostic Mammogram 08/06/17 IMPRESSION: 1. No evidence of recurrent or new breast malignancy. 2. Benign postsurgical changes on the right. RECOMMENDATION: Diagnostic mammography in 1 year per standard post lumpectomy protocol.  ASSESSMENT & PLAN: [name] is a 47 y.o. premenopausal female with a recent diagnosis of Pope [id number] invasive lobular carcinoma  of the UOQ of right breast, ER/PR positive, Her2 negative, Ki67 10%, Grade I.    1. Malignant new pleasant of upper-outer quadrant of right breast, Pope IA ([id number]), Invasive Lobular Carcinoma, ER/PR Positive, Her2 Negative, Grade I  -Her neoplasm was discovered initially by routine screening mammography on 08/05/16. -We previously reviewed the surgical pathology from 08/27/16, as well as staging and biology of the tumor. Pathology revealed ER/PR positive, Her2 negative, Ki67 10%. Surgical margins were negative. 6 lymph nodes were all negative.  -We previously discussed the risk of cancer recurrence after her complete surgical resection. Given the very early Pope disease, and low-grade tumor, her risk of recurrence is likely low. -In light of the patient's very early Pope and low grade, she does not require adjuvant chemo treatment. I do not think she needs Oncotype Dx test. -She underwent radiation with Dr. Lisbeth Renshaw 10/06/16-11/24/16 -She started Tamoxifen in 11/2016, experiencing manageable eye dryness, vision change, lighter periods and hot flashes, which has improved lately. -I previously suggested she watched for any menorrhagia and use OTC artificial tears. I advise her to stay away from topical products with estrogen or soy products in the ingredients.  -Her last period was in mid 07/2017. She is still perimenopausal. I  discussed if it takes her a longer time to become postmenopause she can consider ovarian suppression to further reduce her risk of recurrence. She will think about this and discuss with Gyn.  -She is clinically doing well. Lab reviewed, her CBC and CMP are within normal limits. Her physical exam and her 07/2017 mammogram were unremarkable. There is no clinical concern for recurrence. -She is scheduled for a mammogram in 07/2018 -Continue breast cancer surveillance.  F/u in 6 months    2. Chest and epigastric pain -She previously required ER evaluation due to acute epigastric pain  on 09/07/17. Labs were unremarkable. RUQ [place] was negative.  She also had episode of upper chest pain a few months ago, resolved spontaneously, no recurrent pain. -She will follow-up with her PCP.  I do not have high suspicion this is related to her breast cancer, or cardiac chest pain.   3. Upper Right Arm Pain associated with Surgery  -resolved now, she benefited from PT significantly    PLAN: -Continue tamoxifen, she is tolerating well -Diagnostic mammogram in 07/2018 -Lab and f/u in 6 months  -She will think about ovarian suppression.    No orders of the defined types were placed in this encounter.  I spent 20 minutes for this visit, more than 50% face-to-face counseling.  Oneal Deputy, am acting as scribe for Janice Merle, MD.   I have reviewed the above documentation for accuracy and completeness, and I agree with the above.     Janice Merle, MD 02/18/2018

## 2018-02-18 ENCOUNTER — Telehealth: Payer: Self-pay | Admitting: Hematology

## 2018-02-18 ENCOUNTER — Inpatient Hospital Stay: Payer: 59

## 2018-02-18 ENCOUNTER — Inpatient Hospital Stay: Payer: 59 | Attending: Hematology | Admitting: Hematology

## 2018-02-18 VITALS — BP 122/90 | HR 60 | Temp 97.9°F | Resp 18 | Ht 67.0 in | Wt 136.2 lb

## 2018-02-18 DIAGNOSIS — R1013 Epigastric pain: Secondary | ICD-10-CM | POA: Insufficient documentation

## 2018-02-18 DIAGNOSIS — C50411 Malignant neoplasm of upper-outer quadrant of right female breast: Secondary | ICD-10-CM | POA: Insufficient documentation

## 2018-02-18 DIAGNOSIS — Z79899 Other long term (current) drug therapy: Secondary | ICD-10-CM | POA: Insufficient documentation

## 2018-02-18 DIAGNOSIS — Z170 Estrogen receptor positive status [ER+]: Secondary | ICD-10-CM

## 2018-02-18 DIAGNOSIS — M79601 Pain in right arm: Secondary | ICD-10-CM | POA: Diagnosis not present

## 2018-02-18 DIAGNOSIS — Z923 Personal history of irradiation: Secondary | ICD-10-CM | POA: Diagnosis not present

## 2018-02-18 DIAGNOSIS — Z79810 Long term (current) use of selective estrogen receptor modulators (SERMs): Secondary | ICD-10-CM

## 2018-02-18 DIAGNOSIS — N951 Menopausal and female climacteric states: Secondary | ICD-10-CM

## 2018-02-18 LAB — COMPREHENSIVE METABOLIC PANEL
ALT: 19 U/L (ref 0–44)
AST: 22 U/L (ref 15–41)
Albumin: 4.1 g/dL (ref 3.5–5.0)
Alkaline Phosphatase: 41 U/L (ref 38–126)
Anion gap: 10 (ref 5–15)
BUN: 18 mg/dL (ref 6–20)
CHLORIDE: 106 mmol/L (ref 98–111)
CO2: 25 mmol/L (ref 22–32)
Calcium: 9.3 mg/dL (ref 8.9–10.3)
Creatinine, Ser: 0.97 mg/dL (ref 0.44–1.00)
GFR calc Af Amer: >60 mL/min (ref >60)
GFR calc non Af Amer: >60 mL/min (ref >60)
GLUCOSE: 86 mg/dL (ref 70–99)
POTASSIUM: 4.6 mmol/L (ref 3.5–5.1)
SODIUM: 141 mmol/L (ref 135–145)
TOTAL PROTEIN: 7 g/dL (ref 6.5–8.1)
Total Bilirubin: 1.2 mg/dL (ref 0.3–1.2)

## 2018-02-18 LAB — CBC WITH DIFFERENTIAL/PLATELET
BASOS ABS: 0.1 10*3/uL (ref 0.0–0.1)
Basophils Relative: 1 %
Eosinophils Absolute: 0.1 10*3/uL (ref 0.0–0.5)
Eosinophils Relative: 4 %
HEMATOCRIT: 41.3 % (ref 34.8–46.6)
Hemoglobin: 14.3 g/dL (ref 11.6–15.9)
LYMPHS ABS: 1.7 10*3/uL (ref 0.9–3.3)
LYMPHS PCT: 40 %
MCH: 33.4 pg (ref 25.1–34.0)
MCHC: 34.7 g/dL (ref 31.5–36.0)
MCV: 96.3 fL (ref 79.5–101.0)
MONO ABS: 0.5 10*3/uL (ref 0.1–0.9)
MONOS PCT: 11 %
NEUTROS ABS: 1.9 10*3/uL (ref 1.5–6.5)
Neutrophils Relative %: 44 %
Platelets: 201 10*3/uL (ref 145–400)
RBC: 4.29 MIL/uL (ref 3.70–5.45)
RDW: 12.5 % (ref 11.2–14.5)
WBC: 4.2 10*3/uL (ref 3.9–10.3)

## 2018-02-18 NOTE — Telephone Encounter (Signed)
Appts scheduled avs/calendar printed per 10/4 los °

## 2018-02-19 ENCOUNTER — Encounter: Payer: Self-pay | Admitting: Hematology

## 2018-02-28 ENCOUNTER — Ambulatory Visit (INDEPENDENT_AMBULATORY_CARE_PROVIDER_SITE_OTHER): Payer: 59 | Admitting: Family Medicine

## 2018-02-28 ENCOUNTER — Encounter: Payer: Self-pay | Admitting: Family Medicine

## 2018-02-28 VITALS — BP 118/74 | HR 55 | Temp 98.1°F | Resp 12 | Ht 67.0 in | Wt 136.4 lb

## 2018-02-28 DIAGNOSIS — Z131 Encounter for screening for diabetes mellitus: Secondary | ICD-10-CM | POA: Diagnosis not present

## 2018-02-28 DIAGNOSIS — Z23 Encounter for immunization: Secondary | ICD-10-CM | POA: Diagnosis not present

## 2018-02-28 DIAGNOSIS — Z0000 Encounter for general adult medical examination without abnormal findings: Secondary | ICD-10-CM

## 2018-02-28 DIAGNOSIS — Z13220 Encounter for screening for lipoid disorders: Secondary | ICD-10-CM

## 2018-02-28 LAB — LIPID PANEL
CHOL/HDL RATIO: 2
Cholesterol: 139 mg/dL (ref 0–200)
HDL: 61.2 mg/dL (ref >39.00)
LDL Cholesterol: 57 mg/dL (ref 0–99)
NONHDL: 77.52
Triglycerides: 105 mg/dL (ref 0.0–149.0)
VLDL: 21 mg/dL (ref 0.0–40.0)

## 2018-02-28 LAB — BASIC METABOLIC PANEL
BUN: 12 mg/dL (ref 6–23)
CO2: 29 meq/L (ref 19–32)
Calcium: 9.3 mg/dL (ref 8.4–10.5)
Chloride: 105 mEq/L (ref 96–112)
Creatinine, Ser: 0.89 mg/dL (ref 0.40–1.20)
GFR: 71.85 mL/min (ref >60.00)
GLUCOSE: 86 mg/dL (ref 70–99)
POTASSIUM: 4.2 meq/L (ref 3.5–5.1)
SODIUM: 139 meq/L (ref 135–145)

## 2018-02-28 NOTE — Patient Instructions (Addendum)
A few things to remember from today's visit:   Routine general medical examination at a health care facility  Screening for lipid disorders - Plan: Lipid panel  Diabetes mellitus screening - Plan: Basic metabolic panel  Today you have you routine preventive visit.  At least 150 minutes of moderate exercise per week, daily brisk walking for 15-30 min is a good exercise option. Healthy diet low in saturated (animal) fats and sweets and consisting of fresh fruits and vegetables, lean meats such as fish and white chicken and whole grains.  These are some of recommendations for screening depending of age and risk factors:   - Vaccines:  Tdap vaccine every 10 years.  Shingles vaccine recommended at age 29, could be given after 48 years of age but not sure about insurance coverage.   Pneumonia vaccines:  Prevnar 13 at 65 and Pneumovax at 72. Sometimes Pneumovax is giving earlier if history of smoking, lung disease,diabetes,kidney disease among some.    Screening for diabetes at age 59 and every 3 years.  Cervical cancer prevention:  Pap smear starts at 48 years of age and continues periodically until 48 years old in low risk women. Pap smear every 3 years between 42 and 51 years old. Pap smear every 3-5 years between women 50 and older if pap smear negative and HPV screening negative.   -Breast cancer: Mammogram: There is disagreement between experts about when to start screening in low risk asymptomatic female but recent recommendations are to start screening at 65 and not later than 48 years old , every 1-2 years and after 48 yo q 2 years. Screening is recommended until 48 years old but some women can continue screening depending of healthy issues.   Colon cancer screening: starts at 48 years old until 48 years old.  Cholesterol disorder screening at age 64 and every 3 years.  Also recommended:  1. Dental visit- Brush and floss your teeth twice daily; visit your dentist twice a  year. 2. Eye doctor- Get an eye exam at least every 2 years. 3. Helmet use- Always wear a helmet when riding a bicycle, motorcycle, rollerblading or skateboarding. 4. Safe sex- If you may be exposed to sexually transmitted infections, use a condom. 5. Seat belts- Seat belts can save your live; always wear one. 6. Smoke/Carbon Monoxide detectors- These detectors need to be installed on the appropriate level of your home. Replace batteries at least once a year. 7. Skin cancer- When out in the sun please cover up and use sunscreen 15 SPF or higher. 8. Violence- If anyone is threatening or hurting you, please tell your healthcare provider.  9. Drink alcohol in moderation- Limit alcohol intake to one drink or less per day. Never drink and drive.  Please be sure medication list is accurate. If a new problem present, please set up appointment sooner than planned today.

## 2018-02-28 NOTE — Progress Notes (Signed)
HPI:   [name] is a 47 y.o. female, who is here today for her routine physical.  Last CPE: 02/24/2017.  Regular exercise 3 or more time per week: At least 5 times per week: Runs, treadmill,wts. Following a healthy diet: Yes. She lives with her fiance.  Chronic medical problems: Breast cancer S/P right breast lumpectomy and lymph node Bx. Follows with Dr Burr Medico, she is on Tamoxifen.  Pap smear 06/2017. She sees Dr Julien Girt, gyn.   Immunization History  Administered Date(s) Administered  . Influenza,inj,Quad PF,6+ Mos 02/24/2017    Mammogram: 07/2017.  She has no concerns today.  Review of Systems  Constitutional: Negative for appetite change, fatigue and fever.  HENT: Negative for dental problem, hearing loss, mouth sores, sore throat, trouble swallowing and voice change.   Eyes: Negative for redness and visual disturbance.  Respiratory: Negative for cough, shortness of breath and wheezing.   Cardiovascular: Negative for chest pain and leg swelling.  Gastrointestinal: Negative for abdominal pain, nausea and vomiting.       No changes in bowel habits.  Endocrine: Negative for cold intolerance, heat intolerance, polydipsia, polyphagia and polyuria.  Genitourinary: Negative for decreased urine volume, dysuria, hematuria, vaginal bleeding and vaginal discharge.  Musculoskeletal: Negative for arthralgias, gait problem and myalgias.  Skin: Negative for color change and rash.  Allergic/Immunologic: Positive for environmental allergies.  Neurological: Negative for syncope, weakness and headaches.  Hematological: Negative for adenopathy. Does not bruise/bleed easily.  Psychiatric/Behavioral: Negative for confusion and sleep disturbance. The patient is nervous/anxious.   All other systems reviewed and are negative.     Current Outpatient Medications on File Prior to Visit  Medication Sig Dispense Refill  . ALPRAZolam (XANAX) 0.5 MG tablet Take 0.5 mg by mouth  at bedtime as needed for anxiety.    . Ciclesonide 37 MCG/ACT AERS Place 1 spray into the nose daily as needed. 1 Inhaler 1  . Cyanocobalamin (B-12 PO) Take 1 tablet by mouth daily.     . fexofenadine (ALLEGRA) 180 MG tablet Take 180 mg by mouth daily as needed for allergies.     . Melatonin 10 MG TABS Take 1 tablet by mouth at bedtime.     . Multiple Vitamin (MULTIVITAMIN) tablet Take 1 tablet by mouth daily.    . Psyllium (METAMUCIL FIBER PO) Take 2 capsules by mouth daily.     . tamoxifen (NOLVADEX) 20 MG tablet Take 1 tablet (20 mg total) by mouth daily. 90 tablet 1  . vitamin C (ASCORBIC ACID) 500 MG tablet Take 500 mg by mouth daily.     No current facility-administered medications on file prior to visit.      Past Medical History:  Diagnosis Date  . Allergy   . Breast cancer ([place]) 08/2016   right  . Chicken pox   . Dental crown present   . HIV infection ([place])    TESTING FOR HIV  . Personal history of radiation therapy    May-July 2018   . Urinary tract infection     Past Surgical History:  Procedure Laterality Date  . BREAST LUMPECTOMY Right 2018  . BREAST LUMPECTOMY WITH RADIOACTIVE SEED AND SENTINEL LYMPH NODE BIOPSY Right 08/27/2016   Procedure: RIGHT BREAST LUMPECTOMY WITH RADIOACTIVE SEED AND SENTINEL LYMPH NODE BIOPSY;  Surgeon: Erroll Luna, MD;  Location: [facility];  Service: General;  Laterality: Right;  . BREAST SURGERY     biopsy  . DILATION AND EVACUATION  04/08/2008;  08/01/2008    Allergies  Allergen Reactions  . Erythromycin Nausea And Vomiting    Family History  Problem Relation Age of Onset  . Diabetes Mellitus II Father   . Arthritis Father   . Diabetes Father   . Heart attack Father   . Kidney disease Father   . Stroke Father   . Heart attack Maternal Grandmother   . Heart attack Maternal Grandfather   . Asthma Maternal Grandfather   . COPD Maternal Grandfather   . Early death Maternal Grandfather   . Hearing loss  Maternal Grandfather   . Asthma Paternal Grandmother   . Early death Paternal Grandfather   . Breast cancer Neg Hx     Social History   Socioeconomic History  . Marital status: Divorced    Spouse name: Not on file  . Number of children: Not on file  . Years of education: Not on file  . Highest education level: Not on file  Occupational History  . Not on file  Social Needs  . Financial resource strain: Not on file  . Food insecurity:    Worry: Not on file    Inability: Not on file  . Transportation needs:    Medical: Not on file    Non-medical: Not on file  Tobacco Use  . Smoking status: Never Smoker  . Smokeless tobacco: Never Used  Substance and Sexual Activity  . Alcohol use: Yes    Alcohol/week: 1.0 - 2.0 standard drinks    Types: 1 - 2 Glasses of wine per week    Comment: 3 x/week  . Drug use: No  . Sexual activity: Not on file  Lifestyle  . Physical activity:    Days per week: Not on file    Minutes per session: Not on file  . Stress: Not on file  Relationships  . Social connections:    Talks on phone: Not on file    Gets together: Not on file    Attends religious service: Not on file    Active member of club or organization: Not on file    Attends meetings of clubs or organizations: Not on file    Relationship status: Not on file  Other Topics Concern  . Not on file  Social History Narrative  . Not on file     Vitals:   02/28/18 0844  BP: 118/74  Pulse: (!) 55  Resp: 12  Temp: 98.1 F (36.7 C)  SpO2: 99%   Body mass index is 21.36 kg/m.   Wt Readings from Last 3 Encounters:  02/28/18 136 lb 6 oz (61.9 kg)  02/18/18 136 lb 3.2 oz (61.8 kg)  01/31/18 142 lb 6 oz (64.6 kg)    Physical Exam  Nursing note and vitals reviewed. Constitutional: She is oriented to person, place, and time. She appears well-developed and well-nourished. No distress.  HENT:  Head: Normocephalic and atraumatic.  Right Ear: Hearing, tympanic membrane, external ear  and ear canal normal.  Left Ear: Hearing, tympanic membrane, external ear and ear canal normal.  Mouth/Throat: Uvula is midline, oropharynx is clear and moist and mucous membranes are normal.  Eyes: Pupils are equal, round, and reactive to light. Conjunctivae and EOM are normal.  Neck: No tracheal deviation present. No thyromegaly present.  Cardiovascular: Regular rhythm. Bradycardia present.  No murmur heard. Pulses:      Dorsalis pedis pulses are 2+ on the right side, and 2+ on the left side.  Respiratory: Effort normal and breath  sounds normal. No respiratory distress.  GI: Soft. She exhibits no mass. There is no hepatomegaly. There is no tenderness.  Genitourinary:  Genitourinary Comments: Deferred to gyn.  Musculoskeletal: She exhibits no edema.  No major deformity or signs of synovitis appreciated.  Lymphadenopathy:    She has no cervical adenopathy.       Right: No supraclavicular adenopathy present.       Left: No supraclavicular adenopathy present.  Neurological: She is alert and oriented to person, place, and time. She has normal strength. No cranial nerve deficit. Coordination and gait normal.  Reflex Scores:      Bicep reflexes are 2+ on the right side and 2+ on the left side.      Patellar reflexes are 2+ on the right side and 2+ on the left side. Skin: Skin is warm. No rash noted. No erythema.  Psychiatric: She has a normal mood and affect. Cognition and memory are normal.  Well groomed, good eye contact.      ASSESSMENT AND PLAN:  [name] was here today annual physical examination.   Orders Placed This Encounter  Procedures  . Basic metabolic panel  . Lipid panel    Lab Results  Component Value Date   CHOL 139 02/28/2018   HDL 61.20 02/28/2018   LDLCALC 57 02/28/2018   TRIG 105.0 02/28/2018   CHOLHDL 2 02/28/2018   Lab Results  Component Value Date   CREATININE 0.89 02/28/2018   BUN 12 02/28/2018   NA 139 02/28/2018   K 4.2  02/28/2018   CL 105 02/28/2018   CO2 29 02/28/2018   Routine general medical examination at a health care facility We discussed the importance of regular physical activity and healthy diet for prevention of chronic illness and/or complications. Preventive guidelines reviewed. Vaccination up-to-date, influenza vaccine given today. She will continue following with her gynecologist for her female preventive care. Next CPE in a year.  The 10-year ASCVD risk score Mikey Bussing DC Brooke Bonito., et al., 2013) is: 0.4%   Values used to calculate the score:     Age: 37 years     Sex: Female     Is Non-Hispanic African American: No     Diabetic: No     Tobacco smoker: No     Systolic Blood Pressure: 366 mmHg     Is BP treated: No     HDL Cholesterol: 61.2 mg/dL     Total Cholesterol: 139 mg/dL  Screening for lipid disorders -     Lipid panel  Diabetes mellitus screening -     Basic metabolic panel      Return in 1 year (on 03/01/2019) for CPE.    [name] G. [name], MD  [facility]. [place] office.

## 2018-03-03 ENCOUNTER — Encounter: Payer: Self-pay | Admitting: Family Medicine

## 2018-05-06 DIAGNOSIS — D3132 Benign neoplasm of left choroid: Secondary | ICD-10-CM | POA: Diagnosis not present

## 2018-05-16 DIAGNOSIS — D225 Melanocytic nevi of trunk: Secondary | ICD-10-CM | POA: Diagnosis not present

## 2018-05-16 DIAGNOSIS — L814 Other melanin hyperpigmentation: Secondary | ICD-10-CM | POA: Diagnosis not present

## 2018-05-16 DIAGNOSIS — L570 Actinic keratosis: Secondary | ICD-10-CM | POA: Diagnosis not present

## 2018-06-23 ENCOUNTER — Other Ambulatory Visit: Payer: Self-pay | Admitting: Hematology

## 2018-06-23 DIAGNOSIS — Z853 Personal history of malignant neoplasm of breast: Secondary | ICD-10-CM

## 2018-07-02 ENCOUNTER — Other Ambulatory Visit: Payer: Self-pay | Admitting: Hematology

## 2018-07-02 DIAGNOSIS — C50911 Malignant neoplasm of unspecified site of right female breast: Secondary | ICD-10-CM

## 2018-07-13 DIAGNOSIS — Z01419 Encounter for gynecological examination (general) (routine) without abnormal findings: Secondary | ICD-10-CM | POA: Diagnosis not present

## 2018-07-13 DIAGNOSIS — C50911 Malignant neoplasm of unspecified site of right female breast: Secondary | ICD-10-CM | POA: Diagnosis not present

## 2018-07-13 DIAGNOSIS — Z6822 Body mass index (BMI) 22.0-22.9, adult: Secondary | ICD-10-CM | POA: Diagnosis not present

## 2018-07-13 DIAGNOSIS — Z1329 Encounter for screening for other suspected endocrine disorder: Secondary | ICD-10-CM | POA: Diagnosis not present

## 2018-07-29 ENCOUNTER — Other Ambulatory Visit: Payer: Self-pay | Admitting: Hematology

## 2018-07-29 DIAGNOSIS — C50911 Malignant neoplasm of unspecified site of right female breast: Secondary | ICD-10-CM

## 2018-08-09 ENCOUNTER — Other Ambulatory Visit: Payer: Self-pay

## 2018-08-09 ENCOUNTER — Ambulatory Visit
Admission: RE | Admit: 2018-08-09 | Discharge: 2018-08-09 | Disposition: A | Discharge status: Home / Self Care | Payer: 59 | Source: Ambulatory Visit | Attending: Hematology | Admitting: Hematology

## 2018-08-09 DIAGNOSIS — Z853 Personal history of malignant neoplasm of breast: Secondary | ICD-10-CM

## 2018-08-15 ENCOUNTER — Telehealth: Payer: Self-pay | Admitting: Hematology

## 2018-08-15 NOTE — Telephone Encounter (Signed)
Rescheduled appt per 3/28 scheduled message.  Patient aware of appt date and time.

## 2018-08-19 ENCOUNTER — Other Ambulatory Visit: Payer: 59

## 2018-08-19 ENCOUNTER — Ambulatory Visit: Payer: 59 | Admitting: Hematology

## 2018-10-04 ENCOUNTER — Telehealth: Payer: Self-pay | Admitting: Hematology

## 2018-10-04 NOTE — Telephone Encounter (Signed)
Called patient per sch msg to ask about changing 5/29 appt to webex/phone visit or postponing. Patient would rather postpone. Confirmed new date and time

## 2018-10-14 ENCOUNTER — Ambulatory Visit: Payer: 59 | Admitting: Hematology

## 2018-10-14 ENCOUNTER — Other Ambulatory Visit: Payer: 59

## 2018-10-25 ENCOUNTER — Other Ambulatory Visit: Payer: Self-pay | Admitting: Hematology

## 2018-10-25 DIAGNOSIS — C50911 Malignant neoplasm of unspecified site of right female breast: Secondary | ICD-10-CM

## 2018-11-04 NOTE — Progress Notes (Signed)
[place]   [phone] [phone]   Clinic Follow up Note   Patient Care Team: [name], [name], MD as PCP - General (Family Medicine) Erroll Luna, MD as Consulting Physician (General Surgery) Kyung Rudd, MD as Consulting Physician ([place]) Truitt Merle, MD as Consulting Physician (Hematology) Gardenia Phlegm, NP as Nurse Practitioner (Hematology and Oncology) Marylynn Pearson, MD as Consulting Physician (Obstetrics and Gynecology)  Date of Service:  11/11/2018  CHIEF COMPLAINT: Follow up Right Breast  Cancer  SUMMARY OF ONCOLOGIC HISTORY: Oncology History Overview Note  Cancer Staging Malignant neoplasm of upper-outer quadrant of right breast in female, estrogen receptor positive ([place]) Staging form: Breast, AJCC 8th Edition - Pathologic stage from 08/27/2016: Stage IA (pT1a(m), pN0, cM0, G1, ER: Positive, PR: Positive, HER2: Negative) - Signed by Truitt Merle, MD on 09/09/2016 - Pathologic: No stage assigned - Unsigned     Malignant neoplasm of upper-outer quadrant of right breast in female, estrogen receptor positive ([place])  08/05/2016 Mammogram   Targeted ultrasound of the right breast was performed demonstrating an irregular hypoechoic mass at 12:30, 5 cm from the nipple measuring 6 x 4 x 7 mm which is thought to correspond to the area of distortion seen mammographically. Targeted ultrasound of the right axilla demonstrates no suspicious appearing axillary lymph nodes.   08/06/2016 Receptors her2   ER 100%+, PR 90%+, HER2-, Ki67 10%   08/06/2016 Initial Biopsy   Right breast 12:30 biopsy showed invasive lobular carcinoma and LCIS   08/19/2016 Imaging   breast MRI showed a 7 x 6 x 7 mm enhancing mass in the posterior third of the upper inner quadrant of the right breast.   08/24/2016 Initial Diagnosis   Malignant neoplasm of upper-outer quadrant of right breast in female, estrogen receptor positive ([place])   08/27/2016  Surgery   Right lumpectomy and SLN biopsy, Dr. Brantley Stage    08/27/2016 Pathology Results   Right lumpectomy showed Fisher and LCIS, G1, final margins are negative, all 6 nodes are negative    10/06/2016 - 11/24/2016 Radiation Therapy   With Dr.Moody. 1. The Right breast was treated to 50.4 Gy in 28 fractions at 1.8 Gy per fraction. 2. The Right breast was boosted to 10 Gy in 5 fractions at 2 Gy per fraction.    12/2016 -  Anti-estrogen oral therapy   Tamoxifen daily   08/06/2017 Mammogram   IMPRESSION: 1. No evidence of recurrent or new breast malignancy. 2. Benign postsurgical changes on the right.       CURRENT THERAPY:  Tamoxifen 67m daily started in 12/07/16  INTERVAL HISTORY:  Janice Mcbainis here for a follow up right breast cancer. She was last seen by me 7 months ago. She presents to the clinic alone. She notes she is doing well. She notes occasion dry throat or like her tonsils are swelling before dissipating. This goes away faster with Allegra. She denies fever, cough or chest pain.  She is tolerating Tamoxifen. She did have a period around 05/18/18 and not since then. She notes having significant hot flashes afterward which is just now improving. She notes she just gyn visit who tested her hormone level and genetic tested. Her genetics was negative.  She notes she has been working from home. She would like Xanax refill which she uses to help her sleep at times.     REVIEW OF SYSTEMS:   Constitutional: Denies fevers, chills or abnormal weight loss (+) improving hot flashes  Eyes:  Denies blurriness of vision Ears, nose, mouth, throat, and face: Denies mucositis or sore throat (+) intermittent dry throat  Respiratory: Denies cough, dyspnea or wheezes Cardiovascular: Denies palpitation, chest discomfort or lower extremity swelling Gastrointestinal:  Denies nausea, heartburn or change in bowel habits Skin: Denies abnormal skin rashes Lymphatics: Denies new lymphadenopathy or  easy bruising Neurological:Denies numbness, tingling or new weaknesses Behavioral/Psych: Mood is stable, no new changes  All other systems were reviewed with the patient and are negative.  MEDICAL HISTORY:  Past Medical History:  Diagnosis Date  . Allergy   . Breast cancer ([place]) 08/2016   right  . Chicken pox   . Dental crown present   . HIV infection ([place])    TESTING FOR HIV  . Personal history of radiation therapy    May-July 2018   . Urinary tract infection     SURGICAL HISTORY: Past Surgical History:  Procedure Laterality Date  . BREAST LUMPECTOMY Right 2018  . BREAST LUMPECTOMY WITH RADIOACTIVE SEED AND SENTINEL LYMPH NODE BIOPSY Right 08/27/2016   Procedure: RIGHT BREAST LUMPECTOMY WITH RADIOACTIVE SEED AND SENTINEL LYMPH NODE BIOPSY;  Surgeon: Erroll Luna, MD;  Location: [facility];  Service: General;  Laterality: Right;  . BREAST SURGERY     biopsy  . DILATION AND EVACUATION  04/08/2008; 08/01/2008    I have reviewed the social history and family history with the patient and they are unchanged from previous note.  ALLERGIES:  is allergic to erythromycin.  MEDICATIONS:  Current Outpatient Medications  Medication Sig Dispense Refill  . ALPRAZolam (XANAX) 0.5 MG tablet Take 1 tablet (0.5 mg total) by mouth at bedtime as needed for anxiety. 20 tablet 0  . Cyanocobalamin (B-12 PO) Take 1 tablet by mouth daily.     . fexofenadine (ALLEGRA) 180 MG tablet Take 180 mg by mouth daily as needed for allergies.     . Melatonin 10 MG TABS Take 1 tablet by mouth at bedtime.     . Multiple Vitamin (MULTIVITAMIN) tablet Take 1 tablet by mouth daily.    . Psyllium (METAMUCIL FIBER PO) Take 2 capsules by mouth daily.     . tamoxifen (NOLVADEX) 20 MG tablet Take 1 tablet (20 mg total) by mouth daily. 30 tablet 5  . vitamin C (ASCORBIC ACID) 500 MG tablet Take 500 mg by mouth daily.     No current facility-administered medications for this visit.     PHYSICAL  EXAMINATION: ECOG PERFORMANCE STATUS: 0 - Asymptomatic  Vitals:   11/11/18 0922  BP: (!) 143/74  Pulse: (!) 52  Resp: 18  Temp: 99.8 F (37.7 C)  SpO2: 100%   Filed Weights   11/11/18 0922  Weight: 137 lb 8 oz (62.4 kg)    GENERAL:alert, no distress and comfortable SKIN: skin color, texture, turgor are normal, no rashes or significant lesions EYES: normal, Conjunctiva are pink and non-injected, sclera clear  NECK: supple, thyroid normal size, non-tender, without nodularity LYMPH:  no palpable lymphadenopathy in the cervical, axillary  LUNGS: clear to auscultation and percussion with normal breathing effort HEART: regular rate & rhythm and no murmurs and no lower extremity edema ABDOMEN:abdomen soft, non-tender and normal bowel sounds Musculoskeletal:no cyanosis of digits and no clubbing  NEURO: alert & oriented x 3 with fluent speech, no focal motor/sensory deficits BREAST: S/p right lumpectomy: surgical incision healed well (+) very small palpable left axillary LN, movable (+) No palpable mass of either breasts   LABORATORY DATA:  I have reviewed  the data as listed CBC Latest Ref Rng & Units 11/11/2018 02/18/2018 09/17/2017  WBC 4.0 - 10.5 K/uL 5.9 4.2 6.0  Hemoglobin 12.0 - 15.0 g/dL 13.5 14.3 13.5  Hematocrit 36.0 - 46.0 % 39.5 41.3 39.8  Platelets 150 - 400 K/uL 201 201 201     CMP Latest Ref Rng & Units 11/11/2018 02/28/2018 02/18/2018  Glucose 70 - 99 mg/dL 68(L) 86 86  BUN 6 - 20 mg/dL _0 Creatinine 0.44 - 1.00 mg/dL 1.06(H) 0.89 0.97  Sodium 135 - 145 mmol/L 142 139 141  Potassium 3.5 - 5.1 mmol/L 4.2 4.2 4.6  Chloride 98 - 111 mmol/L 109 105 106  CO2 22 - 32 mmol/L _1 Calcium 8.9 - 10.3 mg/dL 8.9 9.3 9.3  Total Protein 6.5 - 8.1 g/dL 6.7 - 7.0  Total Bilirubin 0.3 - 1.2 mg/dL 0.6 - 1.2  Alkaline Phos 38 - 126 U/L 49 - 41  AST 15 - 41 U/L 20 - 22  ALT 0 - 44 U/L 15 - 19      RADIOGRAPHIC STUDIES: I have personally reviewed the radiological  images as listed and agreed with the findings in the report. No results found.   ASSESSMENT & PLAN:  [name] is a 48 y.o. female with   1. Malignant new [place] of upper-outer quadrant of right breast, stage IA (pT1bN0M0), Invasive Lobular Carcinoma, ER/PR Positive, Her2 Negative, Grade I  -She was diagnosed in 07/2016. She is s/p right lumpectomy with SLNB and adjuvant radiation.  -In light of the patient's very early stage and low grade, she likely has low risk disease. I do not think she needs Oncotype Dx test. -She started Tamoxifen in 11/2016, experiencing manageable eye dryness, vision change, lighter periods and hot flashes, which has improved lately. -She did see her Gyn Dr Julien Girt who did genetic testing which was negative. Her last period was in 05/2018. She is still perimenopausal.  -She is clinically doing well. Lab reviewed, her CBC and CMP are within normal limits except BG 68, Cr 1.06. Her physical exam and her 07/2018 mammogram were unremarkable except very small palpable left axillary LN, likely benign, will monitor. There is no clinical concern for recurrence. -I encouraged her to drink more water.  -Continue breast cancer surveillance. Next mammogram in 07/2019 -Continue Tamoxifen, tolerating well except moderate hot flushes  -F/u in 4 months. She can contact me sooner if she has concerns about her left axillary LN    2. Allergy symptoms, dry throat  -This is intermittent and improved faster with Allegra.  -I encouraged her to continue allegra or take Claritin as needed.    3. Small left axillary node -found on exam today, small and movable, likely benign, breast exam was negative. -continue monitoring, if does not resolved on next visit, will get an [place]   PLAN: -I refilled Xanax today  -She is clinically doing well  -Continue tamoxifen, she is tolerating well -Lab and f/u in 4 months  -Obtain labs from her Gyn Dr Julien Girt   No problem-specific Assessment &  Plan notes found for this encounter.   No orders of the defined types were placed in this encounter.  All questions were answered. The patient knows to call the clinic with any problems, questions or concerns. No barriers to learning was detected. I spent 15 minutes counseling the patient face to face. The total time spent in the appointment was 20 minutes and more than 50% was on  counseling and review of test results     Truitt Merle, MD 11/11/2018   I, Joslyn Devon, am acting as scribe for Truitt Merle, MD.   I have reviewed the above documentation for accuracy and completeness, and I agree with the above.

## 2018-11-11 ENCOUNTER — Inpatient Hospital Stay: Payer: 59 | Attending: Hematology

## 2018-11-11 ENCOUNTER — Telehealth: Payer: Self-pay | Admitting: Hematology

## 2018-11-11 ENCOUNTER — Other Ambulatory Visit: Payer: Self-pay

## 2018-11-11 ENCOUNTER — Encounter: Payer: Self-pay | Admitting: Hematology

## 2018-11-11 ENCOUNTER — Inpatient Hospital Stay (HOSPITAL_BASED_OUTPATIENT_CLINIC_OR_DEPARTMENT_OTHER): Payer: 59 | Admitting: Hematology

## 2018-11-11 VITALS — BP 143/74 | HR 52 | Temp 99.8°F | Resp 18 | Ht 67.0 in | Wt 137.5 lb

## 2018-11-11 DIAGNOSIS — C50411 Malignant neoplasm of upper-outer quadrant of right female breast: Secondary | ICD-10-CM | POA: Insufficient documentation

## 2018-11-11 DIAGNOSIS — Z79899 Other long term (current) drug therapy: Secondary | ICD-10-CM

## 2018-11-11 DIAGNOSIS — Z170 Estrogen receptor positive status [ER+]: Secondary | ICD-10-CM

## 2018-11-11 DIAGNOSIS — Z923 Personal history of irradiation: Secondary | ICD-10-CM

## 2018-11-11 DIAGNOSIS — C50911 Malignant neoplasm of unspecified site of right female breast: Secondary | ICD-10-CM

## 2018-11-11 DIAGNOSIS — Z79810 Long term (current) use of selective estrogen receptor modulators (SERMs): Secondary | ICD-10-CM

## 2018-11-11 LAB — CBC WITH DIFFERENTIAL/PLATELET
Abs Immature Granulocytes: 0.01 10*3/uL (ref 0.00–0.07)
Basophils Absolute: 0 10*3/uL (ref 0.0–0.1)
Basophils Relative: 1 %
Eosinophils Absolute: 0.1 10*3/uL (ref 0.0–0.5)
Eosinophils Relative: 1 %
HCT: 39.5 % (ref 36.0–46.0)
Hemoglobin: 13.5 g/dL (ref 12.0–15.0)
Immature Granulocytes: 0 %
Lymphocytes Relative: 31 %
Lymphs Abs: 1.8 10*3/uL (ref 0.7–4.0)
MCH: 32.8 pg (ref 26.0–34.0)
MCHC: 34.2 g/dL (ref 30.0–36.0)
MCV: 95.9 fL (ref 80.0–100.0)
Monocytes Absolute: 0.6 10*3/uL (ref 0.1–1.0)
Monocytes Relative: 10 %
Neutro Abs: 3.4 10*3/uL (ref 1.7–7.7)
Neutrophils Relative %: 57 %
Platelets: 201 10*3/uL (ref 150–400)
RBC: 4.12 MIL/uL (ref 3.87–5.11)
RDW: 12.4 % (ref 11.5–15.5)
WBC: 5.9 10*3/uL (ref 4.0–10.5)
nRBC: 0 % (ref 0.0–0.2)

## 2018-11-11 LAB — COMPREHENSIVE METABOLIC PANEL
ALT: 15 U/L (ref 0–44)
AST: 20 U/L (ref 15–41)
Albumin: 3.9 g/dL (ref 3.5–5.0)
Alkaline Phosphatase: 49 U/L (ref 38–126)
Anion gap: 8 (ref 5–15)
BUN: 15 mg/dL (ref 6–20)
CO2: 25 mmol/L (ref 22–32)
Calcium: 8.9 mg/dL (ref 8.9–10.3)
Chloride: 109 mmol/L (ref 98–111)
Creatinine, Ser: 1.06 mg/dL — ABNORMAL HIGH (ref 0.44–1.00)
GFR calc Af Amer: >60 mL/min (ref >60)
GFR calc non Af Amer: >60 mL/min (ref >60)
Glucose, Bld: 68 mg/dL — ABNORMAL LOW (ref 70–99)
Potassium: 4.2 mmol/L (ref 3.5–5.1)
Sodium: 142 mmol/L (ref 135–145)
Total Bilirubin: 0.6 mg/dL (ref 0.3–1.2)
Total Protein: 6.7 g/dL (ref 6.5–8.1)

## 2018-11-11 MED ORDER — TAMOXIFEN CITRATE 20 MG PO TABS: 20.0000 mg | ORAL_TABLET | Freq: Every day | ORAL | 5 refills | Status: DC

## 2018-11-11 MED ORDER — ALPRAZOLAM 0.5 MG PO TABS: 0.5000 mg | ORAL_TABLET | Freq: Every evening | ORAL | 0 refills | Status: DC | PRN

## 2018-11-11 NOTE — Telephone Encounter (Signed)
Scheduled appt per 6/26 los.  Patient declined calendar and avs.

## 2018-12-26 ENCOUNTER — Encounter: Payer: Self-pay | Admitting: Hematology

## 2018-12-26 ENCOUNTER — Other Ambulatory Visit: Payer: Self-pay | Admitting: Hematology

## 2018-12-26 DIAGNOSIS — C50411 Malignant neoplasm of upper-outer quadrant of right female breast: Secondary | ICD-10-CM

## 2018-12-26 DIAGNOSIS — Z170 Estrogen receptor positive status [ER+]: Secondary | ICD-10-CM

## 2018-12-30 ENCOUNTER — Other Ambulatory Visit: Payer: Self-pay | Admitting: Hematology

## 2018-12-30 ENCOUNTER — Ambulatory Visit
Admission: RE | Admit: 2018-12-30 | Discharge: 2018-12-30 | Disposition: A | Discharge status: Home / Self Care | Payer: 59 | Source: Ambulatory Visit | Attending: Hematology | Admitting: Hematology

## 2018-12-30 ENCOUNTER — Other Ambulatory Visit: Payer: Self-pay

## 2018-12-30 DIAGNOSIS — Z170 Estrogen receptor positive status [ER+]: Secondary | ICD-10-CM

## 2018-12-30 DIAGNOSIS — C50411 Malignant neoplasm of upper-outer quadrant of right female breast: Secondary | ICD-10-CM

## 2018-12-30 IMAGING — US US AXILLARY LEFT
1 series · 9 of 9 positions shown · non-contrast
Comparison: Prior exams most recent mammogram dated [DATE].

CLINICAL DATA: Patient's oncologist reports a possible enlarged
left axillary lymph node. This patient has a history of right breast
carcinoma treated with lumpectomy in [DATE].

EXAM:
ULTRASOUND OF THE LEFT AXILLA

[Series 1: us axillary left · 0.06mm/px · 9 of 9 slices shown]
[im 1/9]
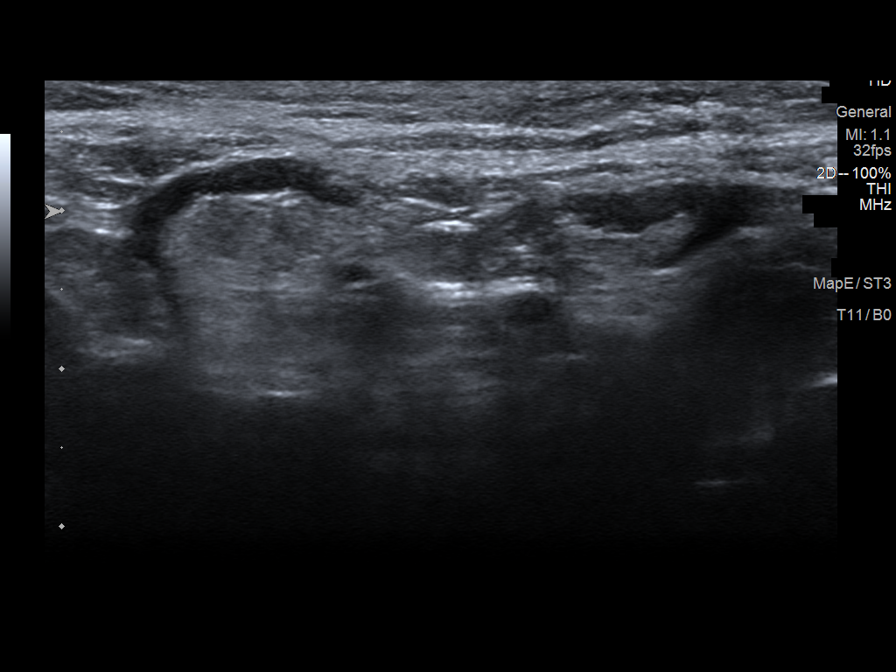
[im 2/9]
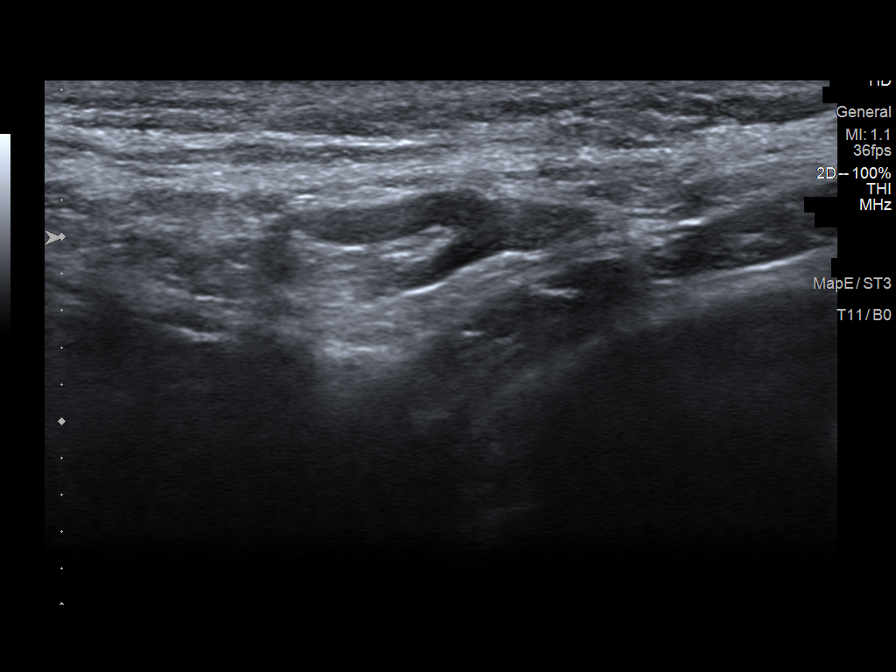
[im 3/9]
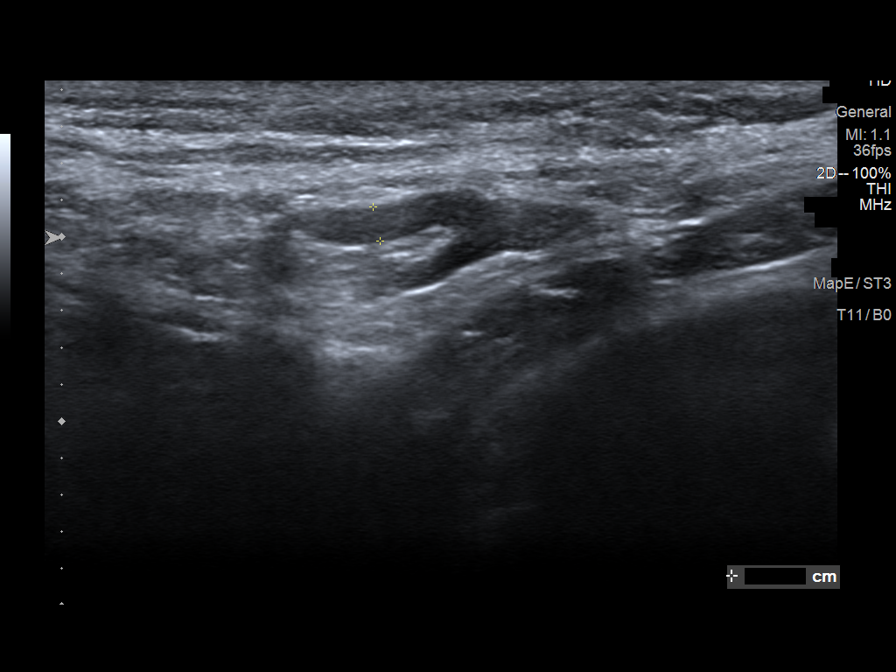
[im 4/9]
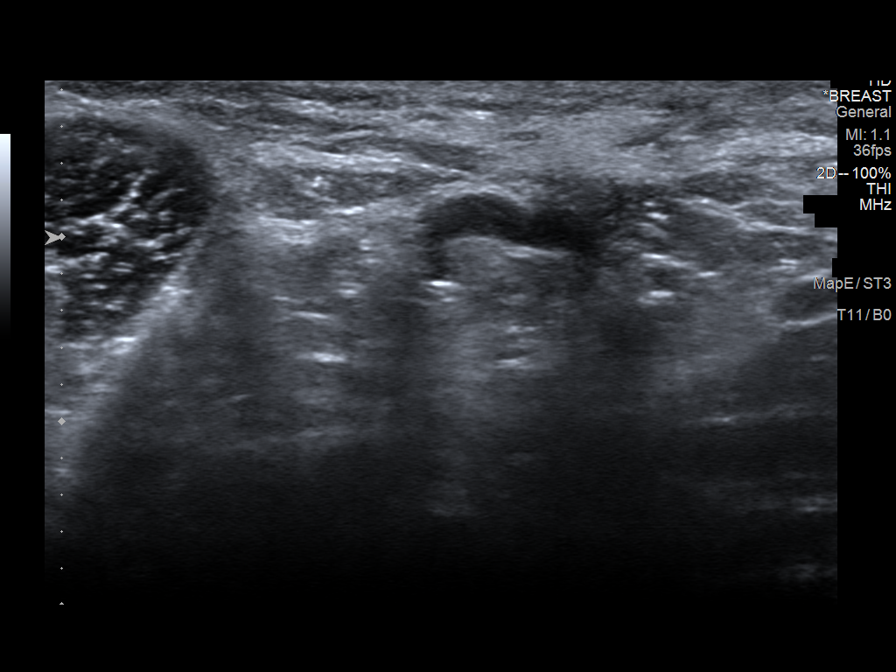
[im 5/9]
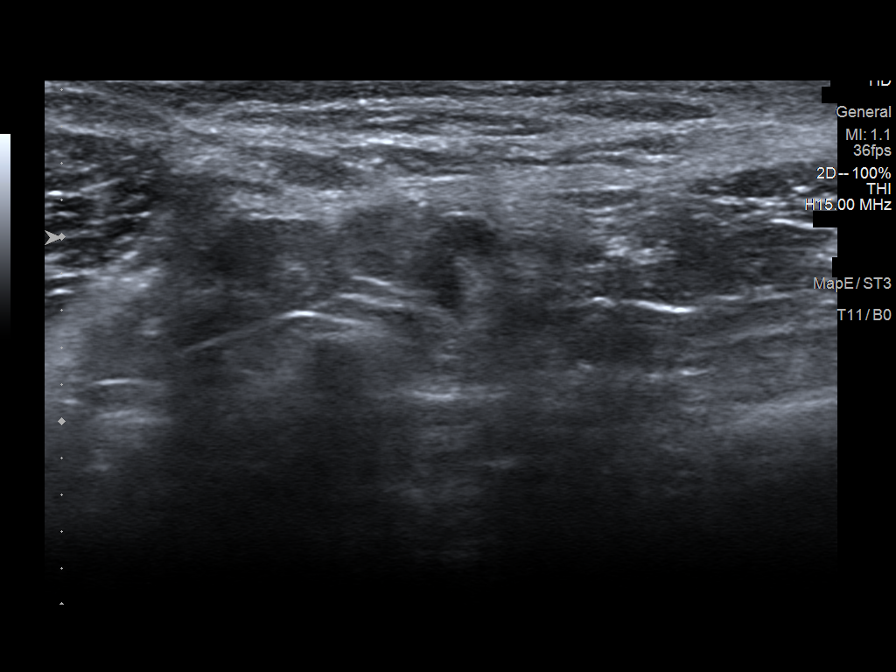
[im 6/9]
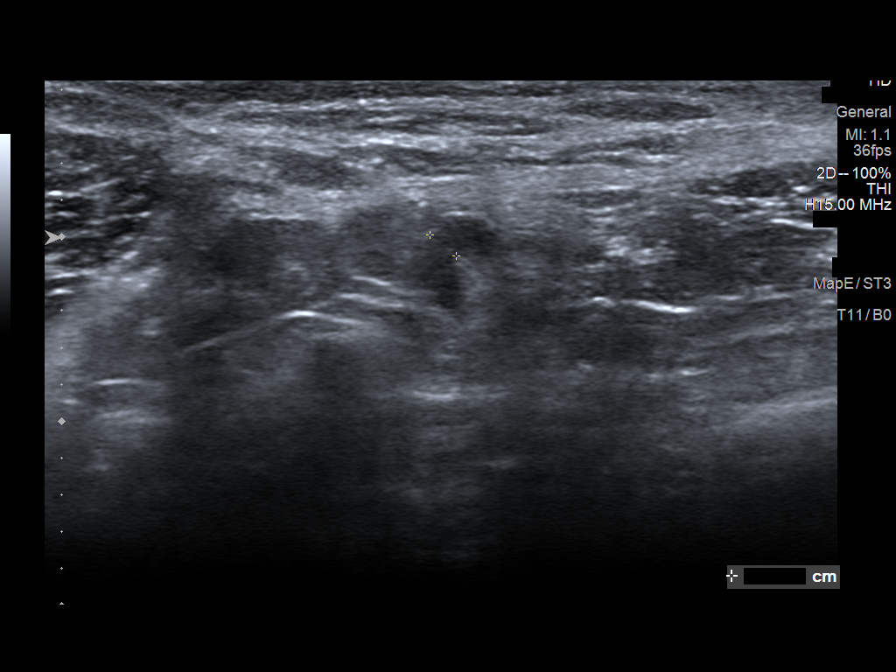
[im 7/9]
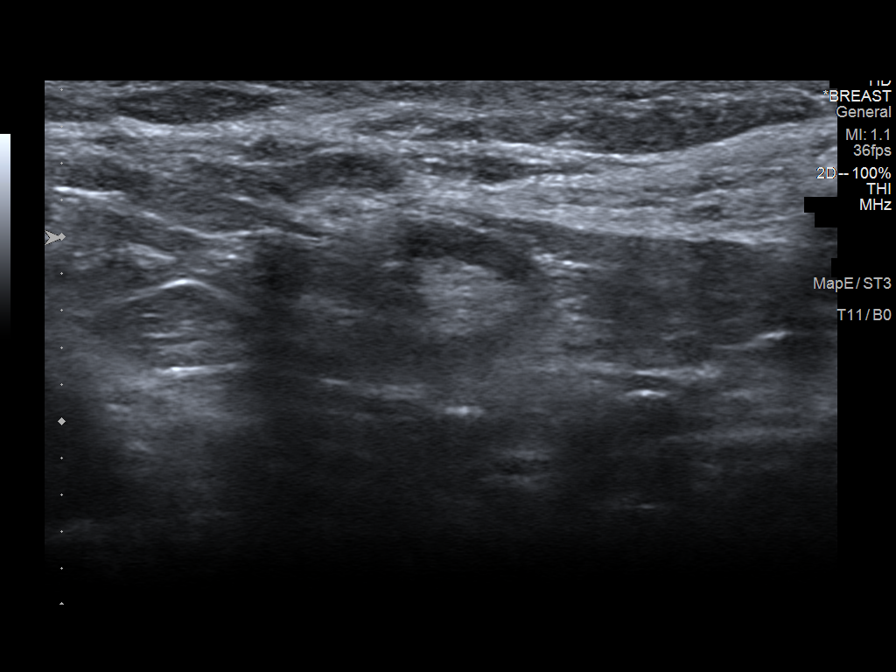
[im 8/9]
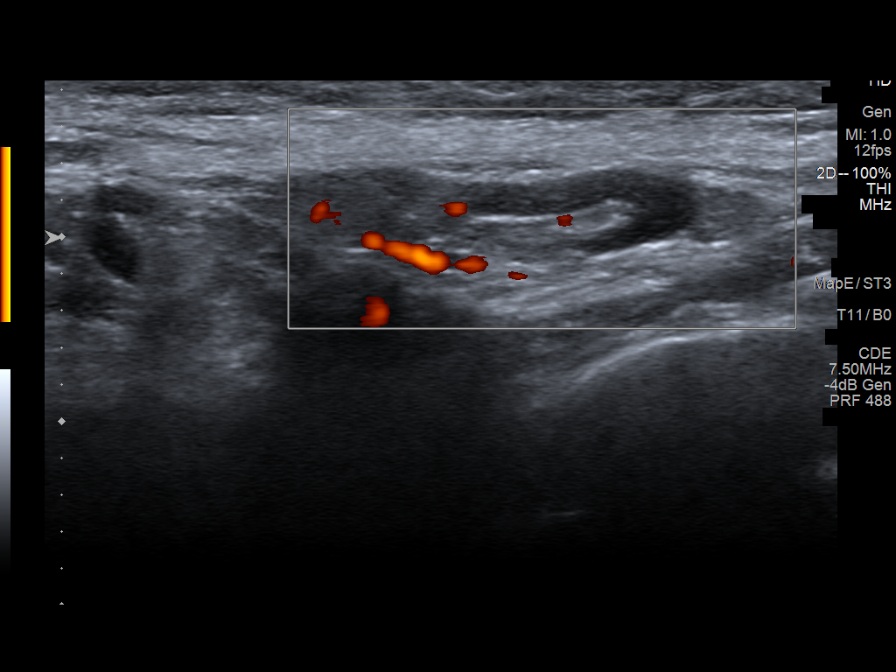
[im 9/9]
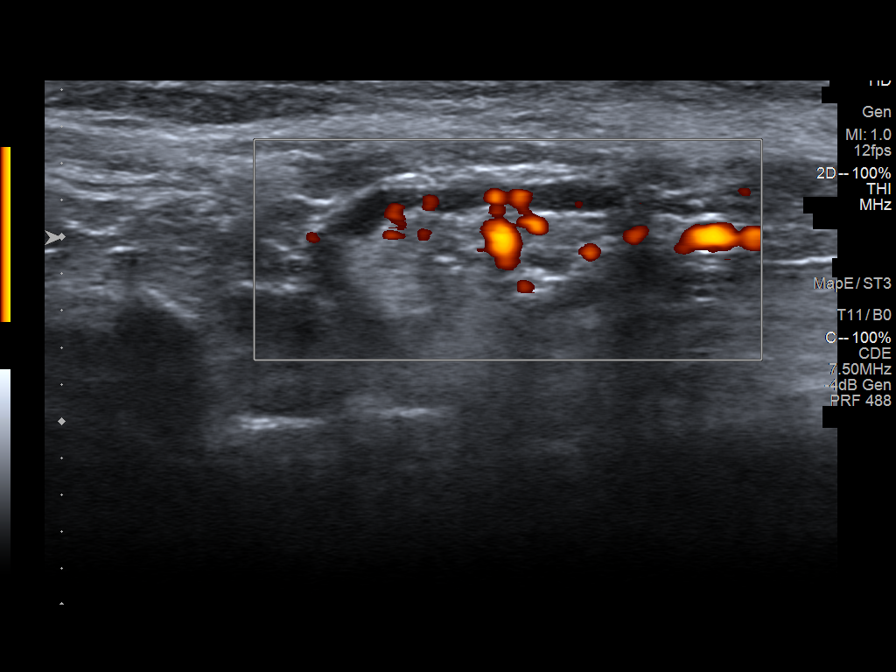

[9 of 9 positions shown; findings below may reference images not displayed]

FINDINGS: On physical exam,there is a small nodule near the chest in the deep
left axilla.

Ultrasound is performed, showing normal sized, normal morphology
lymph nodes in the left axilla. There are no enlarged or abnormal
lymph nodes. There are no axillary masses.
IMPRESSION: Negative exam.  No abnormal left axillary lymph nodes.

RECOMMENDATION:
Diagnostic mammography in [DATE] per normal surveillance
schedule.

I have discussed the findings and recommendations with the patient.
Results were also provided in writing at the conclusion of the
visit. If applicable, a reminder letter will be sent to the patient
regarding the next appointment.

BI-RADS CATEGORY  1: Negative.

## 2019-03-07 ENCOUNTER — Telehealth: Payer: Self-pay | Admitting: Hematology

## 2019-03-07 NOTE — Telephone Encounter (Signed)
YF PAL 10/23 moved appointment to 10/23. Left message.

## 2019-03-09 NOTE — Telephone Encounter (Signed)
Left reminder message regarding 10/23 appointment being moved to 11/3. Schedule mailed.

## 2019-03-10 ENCOUNTER — Other Ambulatory Visit: Payer: 59

## 2019-03-10 ENCOUNTER — Ambulatory Visit: Payer: 59 | Admitting: Hematology

## 2019-03-21 ENCOUNTER — Inpatient Hospital Stay: Payer: 59 | Attending: Hematology

## 2019-03-21 ENCOUNTER — Inpatient Hospital Stay (HOSPITAL_BASED_OUTPATIENT_CLINIC_OR_DEPARTMENT_OTHER): Payer: 59 | Admitting: Nurse Practitioner

## 2019-03-21 ENCOUNTER — Encounter: Payer: Self-pay | Admitting: Nurse Practitioner

## 2019-03-21 ENCOUNTER — Telehealth: Payer: Self-pay | Admitting: Nurse Practitioner

## 2019-03-21 ENCOUNTER — Other Ambulatory Visit: Payer: Self-pay

## 2019-03-21 VITALS — BP 141/87 | HR 57 | Temp 98.7°F | Resp 17 | Ht 67.0 in | Wt 137.4 lb

## 2019-03-21 DIAGNOSIS — Z79810 Long term (current) use of selective estrogen receptor modulators (SERMs): Secondary | ICD-10-CM | POA: Insufficient documentation

## 2019-03-21 DIAGNOSIS — Z79899 Other long term (current) drug therapy: Secondary | ICD-10-CM | POA: Diagnosis not present

## 2019-03-21 DIAGNOSIS — Z923 Personal history of irradiation: Secondary | ICD-10-CM | POA: Diagnosis not present

## 2019-03-21 DIAGNOSIS — C50411 Malignant neoplasm of upper-outer quadrant of right female breast: Secondary | ICD-10-CM | POA: Insufficient documentation

## 2019-03-21 DIAGNOSIS — M545 Low back pain: Secondary | ICD-10-CM | POA: Diagnosis not present

## 2019-03-21 DIAGNOSIS — Z170 Estrogen receptor positive status [ER+]: Secondary | ICD-10-CM | POA: Diagnosis not present

## 2019-03-21 DIAGNOSIS — N951 Menopausal and female climacteric states: Secondary | ICD-10-CM | POA: Insufficient documentation

## 2019-03-21 DIAGNOSIS — B20 Human immunodeficiency virus [HIV] disease: Secondary | ICD-10-CM | POA: Insufficient documentation

## 2019-03-21 DIAGNOSIS — C50911 Malignant neoplasm of unspecified site of right female breast: Secondary | ICD-10-CM | POA: Diagnosis not present

## 2019-03-21 LAB — COMPREHENSIVE METABOLIC PANEL
ALT: 15 U/L (ref 0–44)
AST: 18 U/L (ref 15–41)
Albumin: 4 g/dL (ref 3.5–5.0)
Alkaline Phosphatase: 41 U/L (ref 38–126)
Anion gap: 7 (ref 5–15)
BUN: 15 mg/dL (ref 6–20)
CO2: 22 mmol/L (ref 22–32)
Calcium: 9.2 mg/dL (ref 8.9–10.3)
Chloride: 109 mmol/L (ref 98–111)
Creatinine, Ser: 0.93 mg/dL (ref 0.44–1.00)
GFR calc Af Amer: >60 mL/min (ref >60)
GFR calc non Af Amer: >60 mL/min (ref >60)
Glucose, Bld: 95 mg/dL (ref 70–99)
Potassium: 4.3 mmol/L (ref 3.5–5.1)
Sodium: 138 mmol/L (ref 135–145)
Total Bilirubin: 0.6 mg/dL (ref 0.3–1.2)
Total Protein: 6.7 g/dL (ref 6.5–8.1)

## 2019-03-21 LAB — CBC WITH DIFFERENTIAL/PLATELET
Abs Immature Granulocytes: 0 10*3/uL (ref 0.00–0.07)
Basophils Absolute: 0 10*3/uL (ref 0.0–0.1)
Basophils Relative: 1 %
Eosinophils Absolute: 0 10*3/uL (ref 0.0–0.5)
Eosinophils Relative: 0 %
HCT: 40.4 % (ref 36.0–46.0)
Hemoglobin: 14.1 g/dL (ref 12.0–15.0)
Immature Granulocytes: 0 %
Lymphocytes Relative: 31 %
Lymphs Abs: 2 10*3/uL (ref 0.7–4.0)
MCH: 33.2 pg (ref 26.0–34.0)
MCHC: 34.9 g/dL (ref 30.0–36.0)
MCV: 95.1 fL (ref 80.0–100.0)
Monocytes Absolute: 0.5 10*3/uL (ref 0.1–1.0)
Monocytes Relative: 8 %
Neutro Abs: 3.9 10*3/uL (ref 1.7–7.7)
Neutrophils Relative %: 60 %
Platelets: 219 10*3/uL (ref 150–400)
RBC: 4.25 MIL/uL (ref 3.87–5.11)
RDW: 12.2 % (ref 11.5–15.5)
WBC: 6.5 10*3/uL (ref 4.0–10.5)
nRBC: 0 % (ref 0.0–0.2)

## 2019-03-21 MED ORDER — TAMOXIFEN CITRATE 20 MG PO TABS: 20.0000 mg | ORAL_TABLET | Freq: Every day | ORAL | 3 refills | Status: DC

## 2019-03-21 NOTE — Patient Instructions (Signed)

## 2019-03-21 NOTE — Telephone Encounter (Signed)
Scheduled appt per 11/3 los.  Spoke with pt and they are aware of the appt date and time.

## 2019-03-21 NOTE — Progress Notes (Addendum)
[name]   Telephone:(336) [phone] Fax:(336) [phone]   Clinic Follow up Note   Patient Care Team: [name], [name], MD as PCP - General (Family Medicine) Erroll Luna, MD as Consulting Physician (General Surgery) Kyung Rudd, MD as Consulting Physician ([place]) Truitt Merle, MD as Consulting Physician (Hematology) Gardenia Phlegm, NP as Nurse Practitioner (Hematology and Oncology) Marylynn Pearson, MD as Consulting Physician (Obstetrics and Gynecology) 03/21/2019  CHIEF COMPLAINT: F/u right breast cancer    SUMMARY OF ONCOLOGIC HISTORY: Oncology History Overview Note  Cancer Staging Malignant neoplasm of upper-outer quadrant of right breast in female, estrogen receptor positive ([place]) Staging form: Breast, AJCC 8th Edition - Pathologic stage from 08/27/2016: Stage IA (pT1a(m), pN0, cM0, G1, ER: Positive, PR: Positive, HER2: Negative) - Signed by Truitt Merle, MD on 09/09/2016 - Pathologic: No stage assigned - Unsigned     Malignant neoplasm of upper-outer quadrant of right breast in female, estrogen receptor positive ([place])  08/05/2016 Mammogram   Targeted ultrasound of the right breast was performed demonstrating an irregular hypoechoic mass at 12:30, 5 cm from the nipple measuring 6 x 4 x 7 mm which is thought to correspond to the area of distortion seen mammographically. Targeted ultrasound of the right axilla demonstrates no suspicious appearing axillary lymph nodes.   08/06/2016 Receptors her2   ER 100%+, PR 90%+, HER2-, Ki67 10%   08/06/2016 Initial Biopsy   Right breast 12:30 biopsy showed invasive lobular carcinoma and LCIS   08/19/2016 Imaging   breast MRI showed a 7 x 6 x 7 mm enhancing mass in the posterior third of the upper inner quadrant of the right breast.   08/24/2016 Initial Diagnosis   Malignant neoplasm of upper-outer quadrant of right breast in female, estrogen receptor positive ([place])   08/27/2016 Surgery   Right lumpectomy  and SLN biopsy, Dr. Brantley Stage    08/27/2016 Pathology Results   Right lumpectomy showed [place] and LCIS, G1, final margins are negative, all 6 nodes are negative    10/06/2016 - 11/24/2016 Radiation Therapy   With Dr.Moody. 1. The Right breast was treated to 50.4 Gy in 28 fractions at 1.8 Gy per fraction. 2. The Right breast was boosted to 10 Gy in 5 fractions at 2 Gy per fraction.    12/2016 -  Anti-estrogen oral therapy   Tamoxifen daily   08/06/2017 Mammogram   IMPRESSION: 1. No evidence of recurrent or new breast malignancy. 2. Benign postsurgical changes on the right.    08/09/2018 Mammogram   IMPRESSION: Stable right breast lumpectomy site. No mammographic evidence of malignancy in the bilateral breasts.   12/30/2018 Breast US   LEFT BREAST US FINDINGS: On physical exam,there is a small nodule near the chest in the deep left axilla. Ultrasound is performed, showing normal sized, normal morphology lymph nodes in the left axilla. There are no enlarged or abnormal lymph nodes. There are no axillary masses. IMPRESSION: Negative exam.  No abnormal left axillary lymph nodes.       CURRENT THERAPY: tamoxifen 20 mg daily starting 12/2016   INTERVAL HISTORY: [name] returns for f/u as scheduled. She was last seen 10/2018. She had [place] of left axilla in 12/2018 which was normal. She feels well today. She continues to have hot flashes that occur in phases that peak every 3 months. When hot flashes peak, they occur every night, she requires a towel for bed and does not sleep well. She has no other specific complaints from tamoxifen. Denies major bone/joint  pain. She does have occasional low back pain she attributes to standing while working, wearing house shoes, and less yoga and massage lately. No concerns in her breasts such as new mass, nipple discharge, inversion, or skin change. She remains active. Denies change in appetite or weight. Has slower BM when she is less hydrated. LMP 05/18/2018,  denies vaginal bleeding. Has occasional blood with wiping during constipation if straining. Denies fever, chills, cough, chest pain, or signs of thrombosis.    MEDICAL HISTORY:  Past Medical History:  Diagnosis Date  . Allergy   . Breast cancer ([place]) 08/2016   right  . Chicken pox   . Dental crown present   . HIV infection ([place])    TESTING FOR HIV  . Personal history of radiation therapy    May-July 2018   . Urinary tract infection     SURGICAL HISTORY: Past Surgical History:  Procedure Laterality Date  . BREAST LUMPECTOMY Right 2018  . BREAST LUMPECTOMY WITH RADIOACTIVE SEED AND SENTINEL LYMPH NODE BIOPSY Right 08/27/2016   Procedure: RIGHT BREAST LUMPECTOMY WITH RADIOACTIVE SEED AND SENTINEL LYMPH NODE BIOPSY;  Surgeon: Erroll Luna, MD;  Location: [facility];  Service: General;  Laterality: Right;  . BREAST SURGERY     biopsy  . DILATION AND EVACUATION  04/08/2008; 08/01/2008    I have reviewed the social history and family history with the patient and they are unchanged from previous note.  ALLERGIES:  is allergic to erythromycin.  MEDICATIONS:  Current Outpatient Medications  Medication Sig Dispense Refill  . ALPRAZolam (XANAX) 0.5 MG tablet Take 1 tablet (0.5 mg total) by mouth at bedtime as needed for anxiety. 20 tablet 0  . Calcium Carb-Cholecalciferol (CALCIUM 500 + D3 PO)     . Cyanocobalamin (B-12 PO) Take 1 tablet by mouth daily.     . fexofenadine (ALLEGRA) 180 MG tablet Take 180 mg by mouth daily as needed for allergies.     . Melatonin 10 MG TABS Take 1 tablet by mouth at bedtime.     . Multiple Vitamin (MULTIVITAMIN) tablet Take 1 tablet by mouth daily.    . Omega 3 1200 MG CAPS     . Psyllium (METAMUCIL FIBER PO) Take 2 capsules by mouth daily.     . tamoxifen (NOLVADEX) 20 MG tablet Take 1 tablet (20 mg total) by mouth daily. 90 tablet 3  . vitamin C (ASCORBIC ACID) 500 MG tablet Take 500 mg by mouth daily.     No current  facility-administered medications for this visit.     PHYSICAL EXAMINATION: ECOG PERFORMANCE STATUS: 0 - Asymptomatic  Vitals:   03/21/19 1004  BP: (!) 141/87  Pulse: (!) 57  Resp: 17  Temp: 98.7 F (37.1 C)  SpO2: 99%   Filed Weights   03/21/19 1004  Weight: 137 lb 6.4 oz (62.3 kg)    GENERAL:alert, no distress and comfortable SKIN: no rash  EYES: sclera clear LYMPH:  no palpable cervical or supraclavicular lymphadenopathy LUNGS: respirations even and unlabored, normal breathing effort HEART: regular rate & rhythm, no lower extremity edema ABDOMEN:abdomen soft, flat, non-tender and normal bowel sounds Musculoskeletal: no focal spinal tenderness  NEURO: alert & oriented x 3 with fluent speech, no focal motor/sensory deficits BREAST: s/p right lumpectomy, incisions completely healed. No palpable mass in either breast or right axilla that I could appreciate. Very small LN in the deep left axilla, non-fixed, non-tender   LABORATORY DATA:  I have reviewed the data as listed  CBC Latest Ref Rng & Units 03/21/2019 11/11/2018 02/18/2018  WBC 4.0 - 10.5 K/uL 6.5 5.9 4.2  Hemoglobin 12.0 - 15.0 g/dL 14.1 13.5 14.3  Hematocrit 36.0 - 46.0 % 40.4 39.5 41.3  Platelets 150 - 400 K/uL 219 201 201     CMP Latest Ref Rng & Units 03/21/2019 11/11/2018 02/28/2018  Glucose 70 - 99 mg/dL 95 68(L) 86  BUN 6 - 20 mg/dL _0 Creatinine 0.44 - 1.00 mg/dL 0.93 1.06(H) 0.89  Sodium 135 - 145 mmol/L 138 142 139  Potassium 3.5 - 5.1 mmol/L 4.3 4.2 4.2  Chloride 98 - 111 mmol/L 109 109 105  CO2 22 - 32 mmol/L _1 Calcium 8.9 - 10.3 mg/dL 9.2 8.9 9.3  Total Protein 6.5 - 8.1 g/dL 6.7 6.7 -  Total Bilirubin 0.3 - 1.2 mg/dL 0.6 0.6 -  Alkaline Phos 38 - 126 U/L 41 49 -  AST 15 - 41 U/L 18 20 -  ALT 0 - 44 U/L 15 15 -      RADIOGRAPHIC STUDIES: I have personally reviewed the radiological images as listed and agreed with the findings in the report. No results found.   ASSESSMENT &  PLAN: [name] is a 48 y.o. female with   1. Malignant new pleasant of upper-outer quadrant of right breast, stage IA (pT1bN0M0), Invasive Lobular Carcinoma, ER/PR Positive, Her2 Negative, Grade I  -She was diagnosed in 07/2016. She is s/p right lumpectomy with SLNB and adjuvant radiation.  -Oncotype DX was not done for her early stage, likely low risk disease  -She started Tamoxifen in 11/2016, at first she experienced manageable eye dryness, vision change, lighter periods and hot flashes. Her main concern now is hot flashes that peak every 3 months, tolerable overall  -per patient she had negative genetic test per GYN Dr. Julien Girt -LMP 05/18/2018. She is still perimenopausal.  -07/2018 mammogram was negative -left axillary US obtained for palpable left LN which was benign appearing, no further work up  -continue breast cancer surveillance  2. Small left axillary node -very small, non-fixed and non-tender -she reportedly had a tick bit with swelling in the left arm 2 weeks before she first noticed the enlarged LN -[place] in 12/2018 showed no abnormal left axillary LNs -stable on exam  3. Occasional mild low back pain -she attributes to standing, less yoga and massage lately -possibly also somewhat related to Tamoxifen -I encouraged her to do more Yoga as she is able, wear good shoes and use lumbar support when standing   Disposition:  Ms. Friel is clinically doing well. Breast exam is benign, left axillary LN is stable. CBC and CMP are normal. No clinical concern for recurrence. 07/2018 mammogram and 12/2018 Left ax [place] were negative. She tolerates Tamoxifen well with moderate hot flashes, no other significant toxicities. We discussed SSRI vs gabapentin for hot flashes. She is not interested in taking anti-depressant. I provided her with information on gabapentin, she will research and let me know if she would like to try. She will continue Tamoxifen, I refilled for 1 year.   She has  mild low back pain, exam is benign. This is likely MSK related to posture, standing, and less yoga. I encouraged her to wear lumbar support and good shoes when standing. Back pain may also be somewhat related to Tamoxifen. Will monitor.   She will return for lab and f/u in 4 months to continue breast cancer surveillance.   All questions were  answered. The patient knows to call the clinic with any problems, questions or concerns. No barriers to learning was detected. I spent 20 minutes counseling the patient face to face. The total time spent in the appointment was 25 minutes and more than 50% was on counseling and review of test results     Janice Feeling, NP 03/21/19

## 2019-06-26 ENCOUNTER — Encounter: Payer: Self-pay | Admitting: Hematology

## 2019-06-27 ENCOUNTER — Other Ambulatory Visit: Payer: Self-pay | Admitting: Nurse Practitioner

## 2019-06-27 DIAGNOSIS — C50911 Malignant neoplasm of unspecified site of right female breast: Secondary | ICD-10-CM

## 2019-07-14 NOTE — Progress Notes (Signed)
[facility]   Telephone:(336) [phone] Fax:(336) [phone]   Clinic Follow up Note   Patient Care Team: [name], [name], MD as PCP - General (Family Medicine) Erroll Luna, MD as Consulting Physician (General Surgery) Kyung Rudd, MD as Consulting Physician ([place]) Truitt Merle, MD as Consulting Physician (Hematology) Gardenia Phlegm, NP as Nurse Practitioner (Hematology and Oncology) Marylynn Pearson, MD as Consulting Physician (Obstetrics and Gynecology)  Date of Service:  07/21/2019  CHIEF COMPLAINT: Follow up Right Breast  Cancer  SUMMARY OF ONCOLOGIC HISTORY: Oncology History Overview Note  Cancer Staging Malignant neoplasm of upper-outer quadrant of right breast in female, estrogen receptor positive ([place]) Staging form: Breast, AJCC 8th Edition - Pathologic stage from 08/27/2016: Stage IA (pT1a(m), pN0, cM0, G1, ER: Positive, PR: Positive, HER2: Negative) - Signed by Truitt Merle, MD on 09/09/2016 - Pathologic: No stage assigned - Unsigned     Malignant neoplasm of upper-outer quadrant of right breast in female, estrogen receptor positive ([place])  08/05/2016 Mammogram   Targeted ultrasound of the right breast was performed demonstrating an irregular hypoechoic mass at 12:30, 5 cm from the nipple measuring 6 x 4 x 7 mm which is thought to correspond to the area of distortion seen mammographically. Targeted ultrasound of the right axilla demonstrates no suspicious appearing axillary lymph nodes.   08/06/2016 Receptors her2   ER 100%+, PR 90%+, HER2-, Ki67 10%   08/06/2016 Initial Biopsy   Right breast 12:30 biopsy showed invasive lobular carcinoma and LCIS   08/19/2016 Imaging   breast MRI showed a 7 x 6 x 7 mm enhancing mass in the posterior third of the upper inner quadrant of the right breast.   08/24/2016 Initial Diagnosis   Malignant neoplasm of upper-outer quadrant of right breast in female, estrogen receptor positive ([place])   08/27/2016  Surgery   Right lumpectomy and SLN biopsy, Dr. Brantley Stage    08/27/2016 Pathology Results   Right lumpectomy showed [place] and LCIS, G1, final margins are negative, all 6 nodes are negative    10/06/2016 - 11/24/2016 Radiation Therapy   With Dr.Moody. 1. The Right breast was treated to 50.4 Gy in 28 fractions at 1.8 Gy per fraction. 2. The Right breast was boosted to 10 Gy in 5 fractions at 2 Gy per fraction.    12/2016 -  Anti-estrogen oral therapy   Tamoxifen daily   08/06/2017 Mammogram   IMPRESSION: 1. No evidence of recurrent or new breast malignancy. 2. Benign postsurgical changes on the right.    08/09/2018 Mammogram   IMPRESSION: Stable right breast lumpectomy site. No mammographic evidence of malignancy in the bilateral breasts.   12/30/2018 Breast US   LEFT BREAST US FINDINGS: On physical exam,there is a small nodule near the chest in the deep left axilla. Ultrasound is performed, showing normal sized, normal morphology lymph nodes in the left axilla. There are no enlarged or abnormal lymph nodes. There are no axillary masses. IMPRESSION: Negative exam.  No abnormal left axillary lymph nodes.        CURRENT THERAPY:  Tamoxifen 41m daily started in 12/07/16  INTERVAL HISTORY:  [name] here for a follow up of right breast cancer. She was last seen by em 6 months ago. She presents to the clinic alone. She notes she is doing well. She uses Xanax, Tylenol PM to help her sleep given her hot flashes from Tamoxifen. She notes her hot flashes are more concentrated at night given she takes her Tamoxifen at  night. She managed this. She notes she has also been stressed working from home during [place] due to increased workload. She is managing. She notes her last period was 04/2018. She has not seen her surgeon in over 1-2 years. She wonders can she use cosmetic Restaline fillers around her lips.     REVIEW OF SYSTEMS:   Constitutional: Denies fevers, chills or  abnormal weight loss (+) Hot flashes, significant at night (+) trouble sleeping  Eyes: Denies blurriness of vision Ears, nose, mouth, throat, and face: Denies mucositis or sore throat Respiratory: Denies cough, dyspnea or wheezes Cardiovascular: Denies palpitation, chest discomfort or lower extremity swelling Gastrointestinal:  Denies nausea, heartburn or change in bowel habits Skin: Denies abnormal skin rashes Lymphatics: Denies new lymphadenopathy or easy bruising Neurological:Denies numbness, tingling or new weaknesses Behavioral/Psych: Mood is stable, no new changes  All other systems were reviewed with the patient and are negative.  MEDICAL HISTORY:  Past Medical History:  Diagnosis Date  . Allergy   . Breast cancer ([place]) 08/2016   right  . Chicken pox   . Dental crown present   . HIV infection ([place])    TESTING FOR HIV  . Personal history of radiation therapy    May-July 2018   . Urinary tract infection     SURGICAL HISTORY: Past Surgical History:  Procedure Laterality Date  . BREAST LUMPECTOMY Right 2018  . BREAST LUMPECTOMY WITH RADIOACTIVE SEED AND SENTINEL LYMPH NODE BIOPSY Right 08/27/2016   Procedure: RIGHT BREAST LUMPECTOMY WITH RADIOACTIVE SEED AND SENTINEL LYMPH NODE BIOPSY;  Surgeon: Erroll Luna, MD;  Location: [facility];  Service: General;  Laterality: Right;  . BREAST SURGERY     biopsy  . DILATION AND EVACUATION  04/08/2008; 08/01/2008    I have reviewed the social history and family history with the patient and they are unchanged from previous note.  ALLERGIES:  is allergic to erythromycin.  MEDICATIONS:  Current Outpatient Medications  Medication Sig Dispense Refill  . ALPRAZolam (XANAX) 0.5 MG tablet Take 1 tablet (0.5 mg total) by mouth at bedtime as needed for anxiety. 20 tablet 0  . Calcium Carb-Cholecalciferol (CALCIUM 500 + D3 PO)     . Cyanocobalamin (B-12 PO) Take 1 tablet by mouth daily.     . fexofenadine (ALLEGRA) 180 MG  tablet Take 180 mg by mouth daily as needed for allergies.     . Melatonin 10 MG TABS Take 1 tablet by mouth at bedtime.     . Multiple Vitamin (MULTIVITAMIN) tablet Take 1 tablet by mouth daily.    . Omega 3 1200 MG CAPS     . Psyllium (METAMUCIL FIBER PO) Take 2 capsules by mouth daily.     . tamoxifen (NOLVADEX) 20 MG tablet Take 1 tablet (20 mg total) by mouth daily. 90 tablet 3  . vitamin C (ASCORBIC ACID) 500 MG tablet Take 500 mg by mouth daily.     No current facility-administered medications for this visit.    PHYSICAL EXAMINATION: ECOG PERFORMANCE STATUS: 0 - Asymptomatic  Vitals:   07/21/19 0858 07/21/19 0859  BP: (!) 159/95 129/72  Pulse: (!) 59   Resp: 17   Temp: 98.2 F (36.8 C)   SpO2: 100%    Filed Weights   07/21/19 0858  Weight: 136 lb 4.8 oz (61.8 kg)    GENERAL:alert, no distress and comfortable SKIN: skin color, texture, turgor are normal, no rashes or significant lesions EYES: normal, Conjunctiva are pink and non-injected, sclera  clear  NECK: supple, thyroid normal size, non-tender, without nodularity LYMPH:  no palpable lymphadenopathy in the cervical, axillary  LUNGS: clear to auscultation and percussion with normal breathing effort HEART: regular rate & rhythm and no murmurs and no lower extremity edema ABDOMEN:abdomen soft, non-tender and normal bowel sounds Musculoskeletal:no cyanosis of digits and no clubbing  NEURO: alert & oriented x 3 with fluent speech, no focal motor/sensory deficits BREAST: S/p right lumpectomy: Surgical incision healed well. No palpable mass, nodules or adenopathy bilaterally. Breast exam benign.   LABORATORY DATA:  I have reviewed the data as listed CBC Latest Ref Rng & Units 07/21/2019 03/21/2019 11/11/2018  WBC 4.0 - 10.5 K/uL 5.6 6.5 5.9  Hemoglobin 12.0 - 15.0 g/dL 14.4 14.1 13.5  Hematocrit 36.0 - 46.0 % 42.1 40.4 39.5  Platelets 150 - 400 K/uL 209 219 201     CMP Latest Ref Rng & Units 07/21/2019 03/21/2019  11/11/2018  Glucose 70 - 99 mg/dL 88 95 68(L)  BUN 6 - 20 mg/dL _0 Creatinine 0.44 - 1.00 mg/dL 0.97 0.93 1.06(H)  Sodium 135 - 145 mmol/L 142 138 142  Potassium 3.5 - 5.1 mmol/L 4.5 4.3 4.2  Chloride 98 - 111 mmol/L 108 109 109  CO2 22 - 32 mmol/L _1 Calcium 8.9 - 10.3 mg/dL 9.3 9.2 8.9  Total Protein 6.5 - 8.1 g/dL 6.9 6.7 6.7  Total Bilirubin 0.3 - 1.2 mg/dL 1.0 0.6 0.6  Alkaline Phos 38 - 126 U/L 55 41 49  AST 15 - 41 U/L _2 ALT 0 - 44 U/L _3 RADIOGRAPHIC STUDIES: I have personally reviewed the radiological images as listed and agreed with the findings in the report. No results found.   ASSESSMENT & PLAN:  [name] is a 49 y.o. female with    1. Malignant new pleasant of upper-outer quadrant of right breast, stage IA (pT1bN0M0), Invasive Lobular Carcinoma, ER/PR Positive, Her2 Negative, Grade I  -She was diagnosed in 07/2016. She is s/p right lumpectomy with SLNB and adjuvant radiation.  -In light of the patient's very early stage and low grade, she likely has low risk disease. I did not think she needs Oncotype Dx test. -She started Tamoxifen in 11/2016, experiencing manageable eye dryness, vision change, lighter periods and hot flashes.  -She did see her Gyn Dr Julien Girt who did genetic testing which was negative. Her last period was in 05/2018. Will check her [place] on next visit  -we discussed the option of switching her tamoxifen to AI if she is postmenopausal -She is clinically doing well. Lab reviewed, her CBC and CMP are within normal limits. Her physical exam and her 07/2018 mammogram were unremarkable. There is no clinical concern for recurrence. -Continue breast cancer surveillance.Next mammogram set for 08/11/19 -Continue Tamoxifen -F/u in 6 months or later if she sees [facility] in near future    2. Insomnia  -Secondary to hot flashes  -She has been taking Xanax as needed. She is open to using Bendryl or OTC Z-Quil which I  encouraged her to use so she can use Xanax less.  -I reviewed sleep hygiene with her.        PLAN: -She is clinically doing well  -Continue tamoxifen -Lab and f/u open  -Will check about cosmetic Restaline fillers for the face    No problem-specific Assessment & Plan notes found for this encounter.   Orders Placed This Encounter  Procedures  . Estradiol    Standing Status:   Future    Standing Expiration Date:   07/20/2020  . FSH-Follicle stimulating hormone    Standing Status:   Future    Standing Expiration Date:   07/20/2020   All questions were answered. The patient knows to call the clinic with any problems, questions or concerns. No barriers to learning was detected. The total time spent in the appointment was 25 minutes.     Truitt Merle, MD 07/21/2019   I, Joslyn Devon, am acting as scribe for Truitt Merle, MD.   I have reviewed the above documentation for accuracy and completeness, and I agree with the above.

## 2019-07-21 ENCOUNTER — Inpatient Hospital Stay (HOSPITAL_BASED_OUTPATIENT_CLINIC_OR_DEPARTMENT_OTHER): Payer: 59 | Admitting: Hematology

## 2019-07-21 ENCOUNTER — Inpatient Hospital Stay: Payer: 59 | Attending: Hematology

## 2019-07-21 ENCOUNTER — Telehealth: Payer: Self-pay | Admitting: Hematology

## 2019-07-21 ENCOUNTER — Encounter: Payer: Self-pay | Admitting: Hematology

## 2019-07-21 ENCOUNTER — Other Ambulatory Visit: Payer: Self-pay

## 2019-07-21 VITALS — BP 129/72 | HR 59 | Temp 98.2°F | Resp 17 | Ht 67.0 in | Wt 136.3 lb

## 2019-07-21 DIAGNOSIS — Z170 Estrogen receptor positive status [ER+]: Secondary | ICD-10-CM | POA: Diagnosis not present

## 2019-07-21 DIAGNOSIS — Z79899 Other long term (current) drug therapy: Secondary | ICD-10-CM | POA: Insufficient documentation

## 2019-07-21 DIAGNOSIS — Z79810 Long term (current) use of selective estrogen receptor modulators (SERMs): Secondary | ICD-10-CM | POA: Diagnosis not present

## 2019-07-21 DIAGNOSIS — C50411 Malignant neoplasm of upper-outer quadrant of right female breast: Secondary | ICD-10-CM | POA: Diagnosis not present

## 2019-07-21 DIAGNOSIS — G4700 Insomnia, unspecified: Secondary | ICD-10-CM | POA: Diagnosis not present

## 2019-07-21 DIAGNOSIS — N951 Menopausal and female climacteric states: Secondary | ICD-10-CM | POA: Insufficient documentation

## 2019-07-21 DIAGNOSIS — Z923 Personal history of irradiation: Secondary | ICD-10-CM | POA: Diagnosis not present

## 2019-07-21 DIAGNOSIS — Z566 Other physical and mental strain related to work: Secondary | ICD-10-CM | POA: Diagnosis not present

## 2019-07-21 LAB — COMPREHENSIVE METABOLIC PANEL
ALT: 18 U/L (ref 0–44)
AST: 24 U/L (ref 15–41)
Albumin: 4.1 g/dL (ref 3.5–5.0)
Alkaline Phosphatase: 55 U/L (ref 38–126)
Anion gap: 8 (ref 5–15)
BUN: 15 mg/dL (ref 6–20)
CO2: 26 mmol/L (ref 22–32)
Calcium: 9.3 mg/dL (ref 8.9–10.3)
Chloride: 108 mmol/L (ref 98–111)
Creatinine, Ser: 0.97 mg/dL (ref 0.44–1.00)
GFR calc Af Amer: >60 mL/min (ref >60)
GFR calc non Af Amer: >60 mL/min (ref >60)
Glucose, Bld: 88 mg/dL (ref 70–99)
Potassium: 4.5 mmol/L (ref 3.5–5.1)
Sodium: 142 mmol/L (ref 135–145)
Total Bilirubin: 1 mg/dL (ref 0.3–1.2)
Total Protein: 6.9 g/dL (ref 6.5–8.1)

## 2019-07-21 LAB — CBC WITH DIFFERENTIAL/PLATELET
Abs Immature Granulocytes: 0.01 10*3/uL (ref 0.00–0.07)
Basophils Absolute: 0 10*3/uL (ref 0.0–0.1)
Basophils Relative: 1 %
Eosinophils Absolute: 0.1 10*3/uL (ref 0.0–0.5)
Eosinophils Relative: 2 %
HCT: 42.1 % (ref 36.0–46.0)
Hemoglobin: 14.4 g/dL (ref 12.0–15.0)
Immature Granulocytes: 0 %
Lymphocytes Relative: 40 %
Lymphs Abs: 2.2 10*3/uL (ref 0.7–4.0)
MCH: 32.9 pg (ref 26.0–34.0)
MCHC: 34.2 g/dL (ref 30.0–36.0)
MCV: 96.1 fL (ref 80.0–100.0)
Monocytes Absolute: 0.4 10*3/uL (ref 0.1–1.0)
Monocytes Relative: 8 %
Neutro Abs: 2.8 10*3/uL (ref 1.7–7.7)
Neutrophils Relative %: 49 %
Platelets: 209 10*3/uL (ref 150–400)
RBC: 4.38 MIL/uL (ref 3.87–5.11)
RDW: 12.4 % (ref 11.5–15.5)
WBC: 5.6 10*3/uL (ref 4.0–10.5)
nRBC: 0 % (ref 0.0–0.2)

## 2019-07-21 NOTE — Telephone Encounter (Signed)
Per 3/5 los I will send a schedule message later

## 2019-07-21 NOTE — Telephone Encounter (Signed)
Scheduled appt per 3/5 sch message - unable to reach pt . Left message with appt date and time

## 2019-07-24 ENCOUNTER — Encounter: Payer: Self-pay | Admitting: Hematology

## 2019-08-11 ENCOUNTER — Other Ambulatory Visit: Payer: Self-pay

## 2019-08-11 ENCOUNTER — Ambulatory Visit
Admission: RE | Admit: 2019-08-11 | Discharge: 2019-08-11 | Disposition: A | Discharge status: Home / Self Care | Payer: 59 | Source: Ambulatory Visit | Attending: Nurse Practitioner | Admitting: Nurse Practitioner

## 2019-08-11 ENCOUNTER — Ambulatory Visit: Payer: 59 | Attending: Internal Medicine

## 2019-08-11 DIAGNOSIS — C50911 Malignant neoplasm of unspecified site of right female breast: Secondary | ICD-10-CM

## 2019-08-11 DIAGNOSIS — Z23 Encounter for immunization: Secondary | ICD-10-CM

## 2019-08-11 NOTE — Progress Notes (Signed)
   Covid-19 Vaccination Clinic  Name:  [name]    MRN: [id number] DOB: 10-20-1970  08/11/2019  Ms. [name] was observed post Covid-19 immunization for 15 minutes without incident. She was provided with Vaccine Information Sheet and instruction to access the V-Safe system.   Ms. [name] was instructed to call 911 with any severe reactions post vaccine: Marland Kitchen Difficulty breathing  . Swelling of face and throat  . A fast heartbeat  . A bad rash all over body  . Dizziness and weakness   Immunizations Administered    Name Date Dose VIS Date Route   Pfizer COVID-19 Vaccine 08/11/2019  9:45 AM 0.3 mL 04/28/2019 Intramuscular   Manufacturer: [place]   Lot: [id number]   [place]: [id number]

## 2019-09-04 ENCOUNTER — Ambulatory Visit: Payer: 59 | Attending: Internal Medicine

## 2019-09-04 DIAGNOSIS — Z23 Encounter for immunization: Secondary | ICD-10-CM

## 2019-09-04 NOTE — Progress Notes (Signed)
   Covid-19 Vaccination Clinic  Name:  [name]    MRN: [id number] DOB: 18-Mar-1971  09/04/2019  [name] was observed post Covid-19 immunization for 15 minutes without incident. She was provided with Vaccine Information Sheet and instruction to access the V-Safe system.   [name] was instructed to call 911 with any severe reactions post vaccine: Marland Kitchen Difficulty breathing  . Swelling of face and throat  . A fast heartbeat  . A bad rash all over body  . Dizziness and weakness   Immunizations Administered    Name Date Dose VIS Date Route   Pfizer COVID-19 Vaccine 09/04/2019  2:47 PM 0.3 mL 07/12/2018 Intramuscular   Manufacturer: [place]   Lot: [id number]   [place]: [id number]

## 2019-11-09 ENCOUNTER — Encounter: Payer: Self-pay | Admitting: Family Medicine

## 2020-01-24 NOTE — Progress Notes (Signed)
[place]   Telephone:(336) [phone] Fax:(336) [phone]   Clinic Follow up Note   Patient Care Team: [name], [name], MD as PCP - General (Family Medicine) Janice Luna, MD as Consulting Physician (General Surgery) Janice Rudd, MD as Consulting Physician ([place]) Janice Merle, MD as Consulting Physician (Hematology) Janice Phlegm, NP as Nurse Practitioner (Hematology and Oncology) Janice Pearson, MD as Consulting Physician (Obstetrics and Gynecology)  Date of Service:  01/26/2020  CHIEF COMPLAINT: Follow up Right BreastCancer  SUMMARY OF ONCOLOGIC HISTORY: Oncology History Overview Note  Cancer Staging Malignant neoplasm of upper-outer quadrant of right breast in female, estrogen receptor positive ([place]) Staging form: Breast, AJCC 8th Edition - Pathologic stage from 08/27/2016: Stage IA (pT1a(m), pN0, cM0, G1, ER: Positive, PR: Positive, HER2: Negative) - Signed by Janice Merle, MD on 09/09/2016 - Pathologic: No stage assigned - Unsigned     Malignant neoplasm of upper-outer quadrant of right breast in female, estrogen receptor positive ([place])  08/05/2016 Mammogram   Targeted ultrasound of the right breast was performed demonstrating an irregular hypoechoic mass at 12:30, 5 cm from the nipple measuring 6 x 4 x 7 mm which is thought to correspond to the area of distortion seen mammographically. Targeted ultrasound of the right axilla demonstrates no suspicious appearing axillary lymph nodes.   08/06/2016 Receptors her2   ER 100%+, PR 90%+, HER2-, Ki67 10%   08/06/2016 Initial Biopsy   Right breast 12:30 biopsy showed invasive lobular carcinoma and LCIS   08/19/2016 Imaging   breast MRI showed a 7 x 6 x 7 mm enhancing mass in the posterior third of the upper inner quadrant of the right breast.   08/24/2016 Initial Diagnosis   Malignant neoplasm of upper-outer quadrant of right breast in female, estrogen receptor positive ([place])   08/27/2016  Surgery   Right lumpectomy and SLN biopsy, Dr. Brantley Stage    08/27/2016 Pathology Results   Right lumpectomy showed [place] and LCIS, G1, final margins are negative, all 6 nodes are negative    10/06/2016 - 11/24/2016 Radiation Therapy   With Dr.Moody. 1. The Right breast was treated to 50.4 Gy in 28 fractions at 1.8 Gy per fraction. 2. The Right breast was boosted to 10 Gy in 5 fractions at 2 Gy per fraction.    12/2016 -  Anti-estrogen oral therapy   Tamoxifen daily   08/06/2017 Mammogram   IMPRESSION: 1. No evidence of recurrent or new breast malignancy. 2. Benign postsurgical changes on the right.    08/09/2018 Mammogram   IMPRESSION: Stable right breast lumpectomy site. No mammographic evidence of malignancy in the bilateral breasts.   12/30/2018 Breast US   LEFT BREAST US FINDINGS: On physical exam,there is a small nodule near the chest in the deep left axilla. Ultrasound is performed, showing normal sized, normal morphology lymph nodes in the left axilla. There are no enlarged or abnormal lymph nodes. There are no axillary masses. IMPRESSION: Negative exam.  No abnormal left axillary lymph nodes.        CURRENT THERAPY:  Tamoxifen 61m daily started in 12/07/16  INTERVAL HISTORY:  AArieanna Presseyis here for a follow up of right breast cancer. She was last seen by me 6 months ago. She presents to the clinic alone. She notes she is doing well. She has a squamous cell skin cancer lesion on her chest removed 6 weeks ago. She notes she is very physically active with exercise and will have occasional right hip pain in the  morning or with running. She notes hot flashes are manageable and she is sleeping better. She has ran out of Xanax and has been taking OTC sleep aid Zs. I reviewed her medication list with her. She notes she did genetic testing and her results were negative. She plans to bring report to our clinic.     REVIEW OF SYSTEMS:   Constitutional: Denies fevers, chills  or abnormal weight loss Eyes: Denies blurriness of vision Ears, nose, mouth, throat, and face: Denies mucositis or sore throat Respiratory: Denies cough, dyspnea or wheezes Cardiovascular: Denies palpitation, chest discomfort or lower extremity swelling Gastrointestinal:  Denies nausea, heartburn or change in bowel habits Skin: Denies abnormal skin rashes Lymphatics: Denies new lymphadenopathy or easy bruising Neurological:Denies numbness, tingling or new weaknesses Behavioral/Psych: Mood is stable, no new changes  All other systems were reviewed with the patient and are negative.  MEDICAL HISTORY:  Past Medical History:  Diagnosis Date  . Allergy   . Breast cancer ([place]) 08/2016   right  . Chicken pox   . Dental crown present   . HIV infection ([place])    TESTING FOR HIV  . Personal history of radiation therapy    May-July 2018   . Urinary tract infection     SURGICAL HISTORY: Past Surgical History:  Procedure Laterality Date  . BREAST LUMPECTOMY Right 2018  . BREAST LUMPECTOMY WITH RADIOACTIVE SEED AND SENTINEL LYMPH NODE BIOPSY Right 08/27/2016   Procedure: RIGHT BREAST LUMPECTOMY WITH RADIOACTIVE SEED AND SENTINEL LYMPH NODE BIOPSY;  Surgeon: Janice Luna, MD;  Location: [facility];  Service: General;  Laterality: Right;  . BREAST SURGERY     biopsy  . DILATION AND EVACUATION  04/08/2008; 08/01/2008    I have reviewed the social history and family history with the patient and they are unchanged from previous note.  ALLERGIES:  is allergic to erythromycin.  MEDICATIONS:  Current Outpatient Medications  Medication Sig Dispense Refill  . Collagen Hydrolysate, Bovine, POWD     . diphenhydrAMINE HCl, Sleep, (ZZZQUIL) 25 MG CAPS     . ALPRAZolam (XANAX) 0.5 MG tablet Take 1 tablet (0.5 mg total) by mouth at bedtime as needed for anxiety. 20 tablet 0  . Calcium Carb-Cholecalciferol (CALCIUM 500 + D3 PO)     . Cyanocobalamin (B-12 PO) Take 1 tablet by mouth  daily.     . fexofenadine (ALLEGRA) 180 MG tablet Take 180 mg by mouth daily as needed for allergies.     . hydrocortisone (ANUSOL-HC) 25 MG suppository Place 25 mg rectally daily as needed.    . Melatonin 10 MG TABS Take 1 tablet by mouth at bedtime.     . Multiple Vitamin (MULTIVITAMIN) tablet Take 1 tablet by mouth daily.    . Omega 3 1200 MG CAPS     . Psyllium (METAMUCIL FIBER PO) Take 2 capsules by mouth daily.     . tamoxifen (NOLVADEX) 20 MG tablet Take 1 tablet (20 mg total) by mouth daily. 90 tablet 3  . vitamin C (ASCORBIC ACID) 500 MG tablet Take 500 mg by mouth daily.     No current facility-administered medications for this visit.    PHYSICAL EXAMINATION: ECOG PERFORMANCE STATUS: 0 - Asymptomatic  Vitals:   01/26/20 0908  BP: 130/77  Pulse: 60  Resp: 18  Temp: 98.8 F (37.1 C)  SpO2: 100%   Filed Weights   01/26/20 0908  Weight: 140 lb 4.8 oz (63.6 kg)    GENERAL:alert, no distress  and comfortable SKIN: skin color, texture, turgor are normal, no rashes or significant lesions EYES: normal, Conjunctiva are pink and non-injected, sclera clear  NECK: supple, thyroid normal size, non-tender, without nodularity LYMPH:  no palpable lymphadenopathy in the cervical, axillary  LUNGS: clear to auscultation and percussion with normal breathing effort HEART: regular rate & rhythm and no murmurs and no lower extremity edema ABDOMEN:abdomen soft, non-tender and normal bowel sounds Musculoskeletal:no cyanosis of digits and no clubbing  NEURO: alert & oriented x 3 with fluent speech, no focal motor/sensory deficits BREAST: S/p right lumpectomy: surgical incision healed well Left breast benign.    LABORATORY DATA:  I have reviewed the data as listed CBC Latest Ref Rng & Units 01/26/2020 07/21/2019 03/21/2019  WBC 4.0 - 10.5 K/uL 5.5 5.6 6.5  Hemoglobin 12.0 - 15.0 Pope/dL 14.2 14.4 14.1  Hematocrit 36 - 46 % 41.4 42.1 40.4  Platelets 150 - 400 K/uL 202 209 219     CMP Latest  Ref Rng & Units 01/26/2020 07/21/2019 03/21/2019  Glucose 70 - 99 mg/dL 77 88 95  BUN 6 - 20 mg/dL _0 Creatinine 0.44 - 1.00 mg/dL 0.90 0.97 0.93  Sodium 135 - 145 mmol/L 140 142 138  Potassium 3.5 - 5.1 mmol/L 4.1 4.5 4.3  Chloride 98 - 111 mmol/L 108 108 109  CO2 22 - 32 mmol/L _1 Calcium 8.9 - 10.3 mg/dL 9.4 9.3 9.2  Total Protein 6.5 - 8.1 Pope/dL 6.9 6.9 6.7  Total Bilirubin 0.3 - 1.2 mg/dL 0.6 1.0 0.6  Alkaline Phos 38 - 126 U/L 45 55 41  AST 15 - 41 U/L _2 ALT 0 - 44 U/L _3 RADIOGRAPHIC STUDIES: I have personally reviewed the radiological images as listed and agreed with the findings in the report. No results found.   ASSESSMENT & PLAN:  [name] is a 49 y.o. female with    1. 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 She is s/p rightlumpectomywith SLNB and adjuvant radiation. No Oncotype Dx testing done.  -She started Tamoxifen in 11/2016, experiencing manageable eye dryness, vision change, lighter periods and hot flashes.  -She did see her Gyn Dr Julien Girt who did genetic testing which was negative.Her last period wasin 05/2018.  -She is clinically doing well. Lab reviewed, her CBC and CMP are within normal limits. Her physical exam and her 07/2019 mammogram were unremarkable. There is no clinical concern for recurrence. -Continue surveillance. I discussed the option of additional screening with annual breast MRIs. I also discussed Abbreviated MRIs which have $400 out-of-pocket cost if insurance does not cover standard MRI. She is interested. She is interested. I will order to be done in 01/2020. -Next Mammogram in 07/2020.  -Continue Tamoxifen. We discussed the option of changing Tamoxifen to AI if she is postmenopausal, she is interested  -F/u in 6 months    2. Bone Health  -Her last period was in  05/2018.  -May consider AI if she is postmenopausal. Will obtain baseline DEXA before s   3. Insomnia, Right hip arthritis  -Insomnia secondary to hot flashes  -Now on OTC Sleep Aid and Melatonin. Sleep has improved.    PLAN: -She is clinically doing well -Continue tamoxifen -will call her with [place] result, if she is postmenopausal, will change tamoxifen to anastrozole  -  Breast MRI in 1-2 weeks  -Lab and F.u in 6 months    No problem-specific Assessment & Plan notes found for this encounter.   Orders Placed This Encounter  Procedures  . MR BREAST BILATERAL W WO CONTRAST INC CAD    Standing Status:   Future    Standing Expiration Date:   01/25/2021    Order Specific Question:   If indicated for the ordered procedure, I authorize the administration of contrast media per Radiology protocol    Answer:   Yes    Order Specific Question:   What is the patient's sedation requirement?    Answer:   No Sedation    Order Specific Question:   Does the patient have a pacemaker or implanted devices?    Answer:   No    Order Specific Question:   Radiology Contrast Protocol - do NOT remove file path    Answer:   [email or web address]    Order Specific Question:   Preferred imaging location?    Answer:   [facility] (table limit-550lbs)   All questions were answered. The patient knows to call the clinic with any problems, questions or concerns. No barriers to learning was detected. The total time spent in the appointment was 25 minutes.     Janice Merle, MD 01/26/2020   I, Joslyn Devon, am acting as scribe for Janice Merle, MD.   I have reviewed the above documentation for accuracy and completeness, and I agree with the above.

## 2020-01-26 ENCOUNTER — Inpatient Hospital Stay: Payer: 59 | Attending: Hematology | Admitting: Hematology

## 2020-01-26 ENCOUNTER — Other Ambulatory Visit: Payer: Self-pay

## 2020-01-26 ENCOUNTER — Inpatient Hospital Stay: Payer: 59

## 2020-01-26 ENCOUNTER — Encounter: Payer: Self-pay | Admitting: Hematology

## 2020-01-26 VITALS — BP 130/77 | HR 60 | Temp 98.8°F | Resp 18 | Ht 67.0 in | Wt 140.3 lb

## 2020-01-26 DIAGNOSIS — G4700 Insomnia, unspecified: Secondary | ICD-10-CM | POA: Insufficient documentation

## 2020-01-26 DIAGNOSIS — C50411 Malignant neoplasm of upper-outer quadrant of right female breast: Secondary | ICD-10-CM | POA: Diagnosis present

## 2020-01-26 DIAGNOSIS — Z923 Personal history of irradiation: Secondary | ICD-10-CM | POA: Diagnosis not present

## 2020-01-26 DIAGNOSIS — Z79899 Other long term (current) drug therapy: Secondary | ICD-10-CM | POA: Diagnosis not present

## 2020-01-26 DIAGNOSIS — Z85828 Personal history of other malignant neoplasm of skin: Secondary | ICD-10-CM | POA: Insufficient documentation

## 2020-01-26 DIAGNOSIS — Z87440 Personal history of urinary (tract) infections: Secondary | ICD-10-CM | POA: Diagnosis not present

## 2020-01-26 DIAGNOSIS — Z170 Estrogen receptor positive status [ER+]: Secondary | ICD-10-CM

## 2020-01-26 DIAGNOSIS — N951 Menopausal and female climacteric states: Secondary | ICD-10-CM | POA: Diagnosis not present

## 2020-01-26 DIAGNOSIS — Z79811 Long term (current) use of aromatase inhibitors: Secondary | ICD-10-CM | POA: Insufficient documentation

## 2020-01-26 LAB — COMPREHENSIVE METABOLIC PANEL
ALT: 26 U/L (ref 0–44)
AST: 23 U/L (ref 15–41)
Albumin: 3.9 g/dL (ref 3.5–5.0)
Alkaline Phosphatase: 45 U/L (ref 38–126)
Anion gap: 5 (ref 5–15)
BUN: 18 mg/dL (ref 6–20)
CO2: 27 mmol/L (ref 22–32)
Calcium: 9.4 mg/dL (ref 8.9–10.3)
Chloride: 108 mmol/L (ref 98–111)
Creatinine, Ser: 0.9 mg/dL (ref 0.44–1.00)
GFR calc Af Amer: >60 mL/min (ref >60)
GFR calc non Af Amer: >60 mL/min (ref >60)
Glucose, Bld: 77 mg/dL (ref 70–99)
Potassium: 4.1 mmol/L (ref 3.5–5.1)
Sodium: 140 mmol/L (ref 135–145)
Total Bilirubin: 0.6 mg/dL (ref 0.3–1.2)
Total Protein: 6.9 g/dL (ref 6.5–8.1)

## 2020-01-26 LAB — CBC WITH DIFFERENTIAL/PLATELET
Abs Immature Granulocytes: 0.01 10*3/uL (ref 0.00–0.07)
Basophils Absolute: 0.1 10*3/uL (ref 0.0–0.1)
Basophils Relative: 1 %
Eosinophils Absolute: 0.3 10*3/uL (ref 0.0–0.5)
Eosinophils Relative: 5 %
HCT: 41.4 % (ref 36.0–46.0)
Hemoglobin: 14.2 g/dL (ref 12.0–15.0)
Immature Granulocytes: 0 %
Lymphocytes Relative: 34 %
Lymphs Abs: 1.9 10*3/uL (ref 0.7–4.0)
MCH: 33 pg (ref 26.0–34.0)
MCHC: 34.3 g/dL (ref 30.0–36.0)
MCV: 96.3 fL (ref 80.0–100.0)
Monocytes Absolute: 0.5 10*3/uL (ref 0.1–1.0)
Monocytes Relative: 10 %
Neutro Abs: 2.8 10*3/uL (ref 1.7–7.7)
Neutrophils Relative %: 50 %
Platelets: 202 10*3/uL (ref 150–400)
RBC: 4.3 MIL/uL (ref 3.87–5.11)
RDW: 12.7 % (ref 11.5–15.5)
WBC: 5.5 10*3/uL (ref 4.0–10.5)
nRBC: 0 % (ref 0.0–0.2)

## 2020-01-27 ENCOUNTER — Encounter: Payer: Self-pay | Admitting: Hematology

## 2020-01-27 LAB — FOLLICLE STIMULATING HORMONE: FSH: 17.6 m[IU]/mL

## 2020-01-27 LAB — ESTRADIOL: Estradiol: 271 pg/mL

## 2020-01-29 ENCOUNTER — Telehealth: Payer: Self-pay | Admitting: Hematology

## 2020-01-29 NOTE — Telephone Encounter (Signed)
Scheduled per los. Called, not able to leave msg. Mailed printout  

## 2020-02-20 ENCOUNTER — Ambulatory Visit
Admission: RE | Admit: 2020-02-20 | Discharge: 2020-02-20 | Disposition: A | Discharge status: Home / Self Care | Payer: 59 | Source: Ambulatory Visit | Attending: Hematology | Admitting: Hematology

## 2020-02-20 ENCOUNTER — Other Ambulatory Visit: Payer: Self-pay

## 2020-02-20 DIAGNOSIS — Z170 Estrogen receptor positive status [ER+]: Secondary | ICD-10-CM

## 2020-02-20 MED ORDER — GADOBUTROL 1 MMOL/ML IV SOLN
6.0000 mL | Freq: Once | INTRAVENOUS | Status: AC | PRN
  Administered 2020-02-20: 6 mL via INTRAVENOUS

## 2020-03-27 ENCOUNTER — Other Ambulatory Visit: Payer: Self-pay | Admitting: Nurse Practitioner

## 2020-03-27 DIAGNOSIS — C50911 Malignant neoplasm of unspecified site of right female breast: Secondary | ICD-10-CM

## 2020-04-19 ENCOUNTER — Ambulatory Visit: Payer: 59

## 2020-05-03 ENCOUNTER — Ambulatory Visit: Payer: 59 | Attending: Internal Medicine

## 2020-05-03 DIAGNOSIS — Z23 Encounter for immunization: Secondary | ICD-10-CM

## 2020-05-03 NOTE — Progress Notes (Signed)
   Covid-19 Vaccination Clinic  Name:  Sabine Tenenbaum    MRN: [id number] DOB: 10/31/1970  05/03/2020  Ms. Sabra Heck was observed post Covid-19 immunization for 30 minutes based on pre-vaccination screening without incident. She was provided with Vaccine Information Sheet and instruction to access the V-Safe system.   Ms. Sabra Heck was instructed to call 911 with any severe reactions post vaccine: Marland Kitchen Difficulty breathing  . Swelling of face and throat  . A fast heartbeat  . A bad rash all over body  . Dizziness and weakness   Immunizations Administered    Name Date Dose VIS Date Route   Pfizer COVID-19 Vaccine 05/03/2020  1:54 PM 0.3 mL 03/06/2020 Intramuscular   Manufacturer: [place]   Lot: [id number]   [place]: [id number]

## 2020-06-27 ENCOUNTER — Other Ambulatory Visit: Payer: Self-pay | Admitting: Hematology

## 2020-06-27 DIAGNOSIS — Z9889 Other specified postprocedural states: Secondary | ICD-10-CM

## 2020-07-24 NOTE — Progress Notes (Signed)
[place]   Telephone:(336) [phone] Fax:([phone]   Clinic Follow up Note   Patient Care Team: [name], [name], MD as PCP - General (Family Medicine) Erroll Luna, MD as Consulting Physician (General Surgery) Kyung Rudd, MD as Consulting Physician ([place]) Truitt Merle, MD as Consulting Physician (Hematology) Gardenia Phlegm, NP as Nurse Practitioner (Hematology and Oncology) Marylynn Pearson, MD as Consulting Physician (Obstetrics and Gynecology)  Date of Service:  07/26/2020  CHIEF COMPLAINT:  Follow up Right BreastCancer  SUMMARY OF ONCOLOGIC HISTORY: Oncology History Overview Note  Cancer Staging Malignant neoplasm of upper-outer quadrant of right breast in female, estrogen receptor positive ([place]) Staging form: Breast, AJCC 8th Edition - Pathologic stage from 08/27/2016: Stage IA (pT1a(m), pN0, cM0, G1, ER: Positive, PR: Positive, HER2: Negative) - Signed by Truitt Merle, MD on 09/09/2016 - Pathologic: No stage assigned - Unsigned     Malignant neoplasm of upper-outer quadrant of right breast in female, estrogen receptor positive ([place])  08/05/2016 Mammogram   Targeted ultrasound of the right breast was performed demonstrating an irregular hypoechoic mass at 12:30, 5 cm from the nipple measuring 6 x 4 x 7 mm which is thought to correspond to the area of distortion seen mammographically. Targeted ultrasound of the right axilla demonstrates no suspicious appearing axillary lymph nodes.   08/06/2016 Receptors her2   ER 100%+, PR 90%+, HER2-, Ki67 10%   08/06/2016 Initial Biopsy   Right breast 12:30 biopsy showed invasive lobular carcinoma and LCIS   08/19/2016 Imaging   breast MRI showed a 7 x 6 x 7 mm enhancing mass in the posterior third of the upper inner quadrant of the right breast.   08/24/2016 Initial Diagnosis   Malignant neoplasm of upper-outer quadrant of right breast in female, estrogen receptor positive ([place])   08/27/2016  Surgery   Right lumpectomy and SLN biopsy, Dr. Brantley Stage    08/27/2016 Pathology Results   Right lumpectomy showed [place] and LCIS, G1, final margins are negative, all 6 nodes are negative    10/06/2016 - 11/24/2016 Radiation Therapy   With Dr.Moody. 1. The Right breast was treated to 50.4 Gy in 28 fractions at 1.8 Gy per fraction. 2. The Right breast was boosted to 10 Gy in 5 fractions at 2 Gy per fraction.    12/2016 -  Anti-estrogen oral therapy   Tamoxifen daily   08/06/2017 Mammogram   IMPRESSION: 1. No evidence of recurrent or new breast malignancy. 2. Benign postsurgical changes on the right.    08/09/2018 Mammogram   IMPRESSION: Stable right breast lumpectomy site. No mammographic evidence of malignancy in the bilateral breasts.   12/30/2018 Breast US   LEFT BREAST US FINDINGS: On physical exam,there is a small nodule near the chest in the deep left axilla. Ultrasound is performed, showing normal sized, normal morphology lymph nodes in the left axilla. There are no enlarged or abnormal lymph nodes. There are no axillary masses. IMPRESSION: Negative exam.  No abnormal left axillary lymph nodes.        CURRENT THERAPY:  Tamoxifen 42m daily started in 12/07/16  INTERVAL HISTORY:  AAnjalee Copeis here for a follow up of right breast cancer. She was last seen by me 5 months ago. She presents to the clinic alone. She notes no new major medical changes. She notes she plans to get marries next months. She has her COVID vaccinations.  She notes she is still on Tamoxifen and tolerating well. She notes she started having tennis  left elbow which is intermittent. She notes she does still lifting weight. She notes hot flashes are manageable and stable. She continues to not have periods. She notes her overall vision continues to worsen and follows her ophthalmologist. I reviewed her medication list. She notes she is out of Xanax and now uses Zquil for sleep as needed. She plans to see  her Gyn in April. She does not plan to f/u with Dr Brantley Stage moving forward.    REVIEW OF SYSTEMS:   Constitutional: Denies fevers, chills or abnormal weight loss (+) hot flashes stable.  Eyes: Denies blurriness of vision Ears, nose, mouth, throat, and face: Denies mucositis or sore throat Respiratory: Denies cough, dyspnea or wheezes Cardiovascular: Denies palpitation, chest discomfort or lower extremity swelling Gastrointestinal:  Denies nausea, heartburn or change in bowel habits Skin: Denies abnormal skin rashes MSK:(+) intermittent left elbow joint pian  Lymphatics: Denies new lymphadenopathy or easy bruising Neurological:Denies numbness, tingling or new weaknesses Behavioral/Psych: Mood is stable, no new changes  All other systems were reviewed with the patient and are negative.  MEDICAL HISTORY:  Past Medical History:  Diagnosis Date  . Allergy   . Breast cancer ([place]) 08/2016   right  . Chicken pox   . Dental crown present   . HIV infection ([place])    TESTING FOR HIV  . Personal history of radiation therapy    May-July 2018   . Urinary tract infection     SURGICAL HISTORY: Past Surgical History:  Procedure Laterality Date  . BREAST LUMPECTOMY Right 2018  . BREAST LUMPECTOMY WITH RADIOACTIVE SEED AND SENTINEL LYMPH NODE BIOPSY Right 08/27/2016   Procedure: RIGHT BREAST LUMPECTOMY WITH RADIOACTIVE SEED AND SENTINEL LYMPH NODE BIOPSY;  Surgeon: Erroll Luna, MD;  Location: [facility];  Service: General;  Laterality: Right;  . BREAST SURGERY     biopsy  . DILATION AND EVACUATION  04/08/2008; 08/01/2008    I have reviewed the social history and family history with the patient and they are unchanged from previous note.  ALLERGIES:  is allergic to erythromycin.  MEDICATIONS:  Current Outpatient Medications  Medication Sig Dispense Refill  . ALPRAZolam (XANAX) 0.5 MG tablet Take 1 tablet (0.5 mg total) by mouth at bedtime as needed for anxiety. 20 tablet 0   . Calcium Carb-Cholecalciferol (CALCIUM 500 + D3 PO)     . Collagen Hydrolysate, Bovine, POWD     . Cyanocobalamin (B-12 PO) Take 1 tablet by mouth daily.     . diphenhydrAMINE HCl, Sleep, (ZZZQUIL) 25 MG CAPS     . fexofenadine (ALLEGRA) 180 MG tablet Take 180 mg by mouth daily as needed for allergies.     . hydrocortisone (ANUSOL-HC) 25 MG suppository Place 25 mg rectally daily as needed.    . Melatonin 10 MG TABS Take 1 tablet by mouth at bedtime.     . Multiple Vitamin (MULTIVITAMIN) tablet Take 1 tablet by mouth daily.    . Omega 3 1200 MG CAPS     . Psyllium (METAMUCIL FIBER PO) Take 2 capsules by mouth daily.     . tamoxifen (NOLVADEX) 20 MG tablet TAKE 1 TABLET BY MOUTH EVERY DAY 90 tablet 3  . vitamin C (ASCORBIC ACID) 500 MG tablet Take 500 mg by mouth daily.     No current facility-administered medications for this visit.    PHYSICAL EXAMINATION: ECOG PERFORMANCE STATUS: 0 - Asymptomatic  Vitals:   07/26/20 1506  BP: (!) 123/92  Pulse: (!) 57  Resp: 15  Temp: 99 F (37.2 C)  SpO2: 100%   Filed Weights   07/26/20 1506  Weight: 142 lb 11.2 oz (64.7 kg)    GENERAL:alert, no distress and comfortable SKIN: skin color, texture, turgor are normal, no rashes or significant lesions EYES: normal, Conjunctiva are pink and non-injected, sclera clear  NECK: supple, thyroid normal size, non-tender, without nodularity LYMPH:  no palpable lymphadenopathy in the cervical, axillary  LUNGS: clear to auscultation and percussion with normal breathing effort HEART: regular rate & rhythm and no murmurs and no lower extremity edema ABDOMEN:abdomen soft, non-tender and normal bowel sounds Musculoskeletal:no cyanosis of digits and no clubbing  NEURO: alert & oriented x 3 with fluent speech, no focal motor/sensory deficits BREAST: S/p right lumpectomy: Surgical incision healed well, minimal scar tissue. No palpable mass, nodules or adenopathy bilaterally. Breast exam benign.    LABORATORY DATA:  I have reviewed the data as listed CBC Latest Ref Rng & Units 07/26/2020 01/26/2020 07/21/2019  WBC 4.0 - 10.5 K/uL 6.7 5.5 5.6  Hemoglobin 12.0 - 15.0 g/dL 13.6 14.2 14.4  Hematocrit 36.0 - 46.0 % 38.9 41.4 42.1  Platelets 150 - 400 K/uL 214 202 209     CMP Latest Ref Rng & Units 07/26/2020 01/26/2020 07/21/2019  Glucose 70 - 99 mg/dL 73 77 88  BUN 6 - 20 mg/dL _0 Creatinine 0.44 - 1.00 mg/dL 0.93 0.90 0.97  Sodium 135 - 145 mmol/L 139 140 142  Potassium 3.5 - 5.1 mmol/L 4.0 4.1 4.5  Chloride 98 - 111 mmol/L 104 108 108  CO2 22 - 32 mmol/L _1 Calcium 8.9 - 10.3 mg/dL 9.1 9.4 9.3  Total Protein 6.5 - 8.1 g/dL 7.1 6.9 6.9  Total Bilirubin 0.3 - 1.2 mg/dL 0.6 0.6 1.0  Alkaline Phos 38 - 126 U/L 56 45 55  AST 15 - 41 U/L _2 ALT 0 - 44 U/L _3 RADIOGRAPHIC STUDIES: I have personally reviewed the radiological images as listed and agreed with the findings in the report. No results found.   ASSESSMENT & PLAN:  Lorell Thibodaux is a 50 y.o. female with    1. 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 She is s/p rightlumpectomywith SLNB and adjuvant radiation. No Oncotype Dx testing done.  -She started Tamoxifen in 11/2016, experiencing manageable hot flashes. -Last period 05/17/18. Her 01/2020 FSH was still pre-menopausal. Will repeat a year later.  -She did see her Gyn Dr Julien Girt who did genetic testing which was negative.Her last period wasin 05/2018. -She is clinically doing well. Lab reviewed, her CBC and CMP are within normal limits. Her physical exam was unremarkable. There is no clinical concern for recurrence. -Next Mammogram in 08/13/20. Next MRI breast 02/2021 -Continue Tamoxifen.  -F/uin 6 months   2. Bone Health  -May consider AI if she is postmenopausal. Will obtain baseline  DEXA when she becomes post-menopausal or before switching to AI.    3.Insomnia and hot flushes  -Insomnia secondary to hot flashes. She is no longer on Xanax, manageable on Zquil.  -hot flush is manageable   4. Weight Loss  -She notes recent weight gain and interested in weight loss. I discussed Tamoxifen can lead to weight gain. -I discussed low carbohydrate intake and less red meat and adequate exercise to help her  lose weight.   5. Vision change -She has had decreased vision in recent years. She continues to f/u with ophthalmologist.    PLAN: -She is clinically doing well -Continue tamoxifen -Mammogram on 08/13/20  -screening MRI breast in 02/2021 -Lab and F/u in 6 months    No problem-specific Assessment & Plan notes found for this encounter.   Orders Placed This Encounter  Procedures  . MR BREAST BILATERAL W WO CONTRAST INC CAD    Standing Status:   Future    Standing Expiration Date:   07/26/2021    Order Specific Question:   If indicated for the ordered procedure, I authorize the administration of contrast media per Radiology protocol    Answer:   Yes    Order Specific Question:   What is the patient's sedation requirement?    Answer:   No Sedation    Order Specific Question:   Does the patient have a pacemaker or implanted devices?    Answer:   No    Order Specific Question:   Radiology Contrast Protocol - do NOT remove file path    Answer:   [email or web address]    Order Specific Question:   Preferred imaging location?    Answer:   [facility] (table limit-550lbs)   All questions were answered. The patient knows to call the clinic with any problems, questions or concerns. No barriers to learning was detected.      Truitt Merle, MD 07/26/2020   I, Joslyn Devon, am acting as scribe for Truitt Merle, MD.   I have reviewed the above documentation for accuracy and completeness, and I agree with the above.

## 2020-07-26 ENCOUNTER — Inpatient Hospital Stay: Payer: 59

## 2020-07-26 ENCOUNTER — Encounter: Payer: Self-pay | Admitting: Hematology

## 2020-07-26 ENCOUNTER — Other Ambulatory Visit: Payer: Self-pay

## 2020-07-26 ENCOUNTER — Inpatient Hospital Stay: Payer: 59 | Attending: Hematology | Admitting: Hematology

## 2020-07-26 VITALS — BP 123/92 | HR 57 | Temp 99.0°F | Resp 15 | Ht 67.0 in | Wt 142.7 lb

## 2020-07-26 DIAGNOSIS — C50411 Malignant neoplasm of upper-outer quadrant of right female breast: Secondary | ICD-10-CM | POA: Diagnosis present

## 2020-07-26 DIAGNOSIS — R634 Abnormal weight loss: Secondary | ICD-10-CM | POA: Diagnosis not present

## 2020-07-26 DIAGNOSIS — H547 Unspecified visual loss: Secondary | ICD-10-CM | POA: Diagnosis not present

## 2020-07-26 DIAGNOSIS — Z923 Personal history of irradiation: Secondary | ICD-10-CM | POA: Diagnosis not present

## 2020-07-26 DIAGNOSIS — G4700 Insomnia, unspecified: Secondary | ICD-10-CM | POA: Insufficient documentation

## 2020-07-26 DIAGNOSIS — Z170 Estrogen receptor positive status [ER+]: Secondary | ICD-10-CM | POA: Insufficient documentation

## 2020-07-26 DIAGNOSIS — Z79899 Other long term (current) drug therapy: Secondary | ICD-10-CM | POA: Insufficient documentation

## 2020-07-26 DIAGNOSIS — R232 Flushing: Secondary | ICD-10-CM | POA: Insufficient documentation

## 2020-07-26 DIAGNOSIS — Z79810 Long term (current) use of selective estrogen receptor modulators (SERMs): Secondary | ICD-10-CM | POA: Diagnosis not present

## 2020-07-26 LAB — CBC WITH DIFFERENTIAL/PLATELET
Abs Immature Granulocytes: 0.01 10*3/uL (ref 0.00–0.07)
Basophils Absolute: 0.1 10*3/uL (ref 0.0–0.1)
Basophils Relative: 1 %
Eosinophils Absolute: 0.1 10*3/uL (ref 0.0–0.5)
Eosinophils Relative: 2 %
HCT: 38.9 % (ref 36.0–46.0)
Hemoglobin: 13.6 g/dL (ref 12.0–15.0)
Immature Granulocytes: 0 %
Lymphocytes Relative: 39 %
Lymphs Abs: 2.6 10*3/uL (ref 0.7–4.0)
MCH: 32.9 pg (ref 26.0–34.0)
MCHC: 35 g/dL (ref 30.0–36.0)
MCV: 94 fL (ref 80.0–100.0)
Monocytes Absolute: 0.5 10*3/uL (ref 0.1–1.0)
Monocytes Relative: 8 %
Neutro Abs: 3.4 10*3/uL (ref 1.7–7.7)
Neutrophils Relative %: 50 %
Platelets: 214 10*3/uL (ref 150–400)
RBC: 4.14 MIL/uL (ref 3.87–5.11)
RDW: 12.1 % (ref 11.5–15.5)
WBC: 6.7 10*3/uL (ref 4.0–10.5)
nRBC: 0 % (ref 0.0–0.2)

## 2020-07-26 LAB — COMPREHENSIVE METABOLIC PANEL
ALT: 13 U/L (ref 0–44)
AST: 19 U/L (ref 15–41)
Albumin: 4.1 g/dL (ref 3.5–5.0)
Alkaline Phosphatase: 56 U/L (ref 38–126)
Anion gap: 10 (ref 5–15)
BUN: 19 mg/dL (ref 6–20)
CO2: 25 mmol/L (ref 22–32)
Calcium: 9.1 mg/dL (ref 8.9–10.3)
Chloride: 104 mmol/L (ref 98–111)
Creatinine, Ser: 0.93 mg/dL (ref 0.44–1.00)
GFR, Estimated: >60 mL/min (ref >60)
Glucose, Bld: 73 mg/dL (ref 70–99)
Potassium: 4 mmol/L (ref 3.5–5.1)
Sodium: 139 mmol/L (ref 135–145)
Total Bilirubin: 0.6 mg/dL (ref 0.3–1.2)
Total Protein: 7.1 g/dL (ref 6.5–8.1)

## 2020-07-29 ENCOUNTER — Telehealth: Payer: Self-pay | Admitting: Hematology

## 2020-07-29 NOTE — Telephone Encounter (Signed)
Left message with follow-up appointment per 3/11 los. Gave option to call back to reschedule if needed.

## 2020-08-07 ENCOUNTER — Encounter: Payer: Self-pay | Admitting: Hematology

## 2020-08-13 ENCOUNTER — Other Ambulatory Visit: Payer: Self-pay

## 2020-08-13 ENCOUNTER — Ambulatory Visit
Admission: RE | Admit: 2020-08-13 | Discharge: 2020-08-13 | Disposition: A | Discharge status: Home / Self Care | Payer: 59 | Source: Ambulatory Visit | Attending: Hematology | Admitting: Hematology

## 2020-08-13 DIAGNOSIS — Z9889 Other specified postprocedural states: Secondary | ICD-10-CM

## 2020-08-13 IMAGING — MG DIGITAL DIAGNOSTIC BILAT W/ TOMO W/ CAD
6 of 9 series · 6 of 25 positions shown · non-contrast
Comparison: Previous exam(s).

CLINICAL DATA: 49-year-old female presenting for routine annual
surveillance status post right breast lumpectomy in [7F].

EXAM:
DIGITAL DIAGNOSTIC BILATERAL MAMMOGRAM WITH TOMOSYNTHESIS AND CAD
TECHNIQUE: Bilateral digital diagnostic mammography and breast tomosynthesis
was performed. The images were evaluated with computer-aided
detection.

[R MLO]
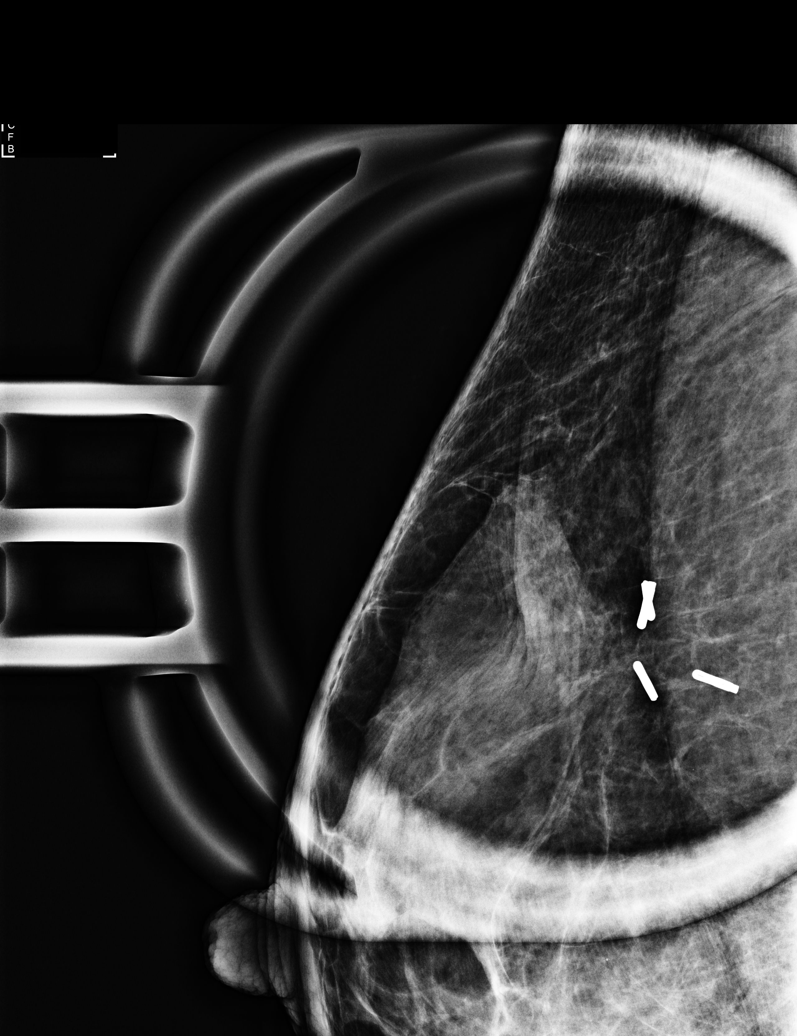

[R MLO synth-2D]
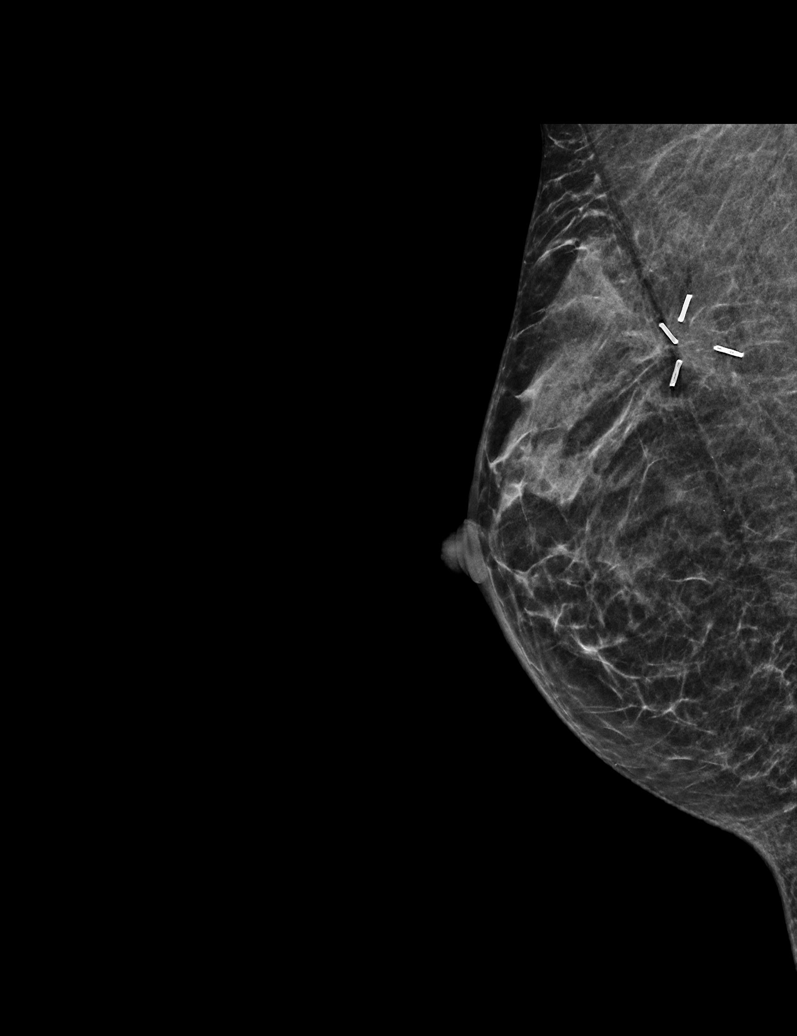

[L MLO synth-2D]
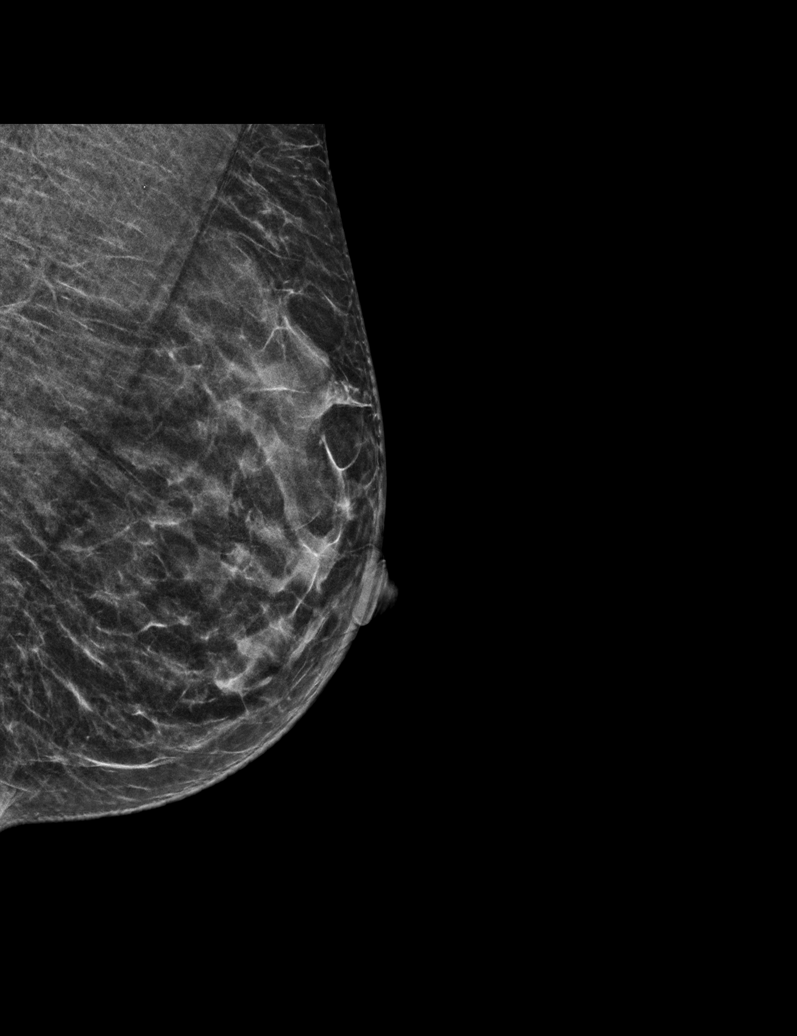

[R CC synth-2D]
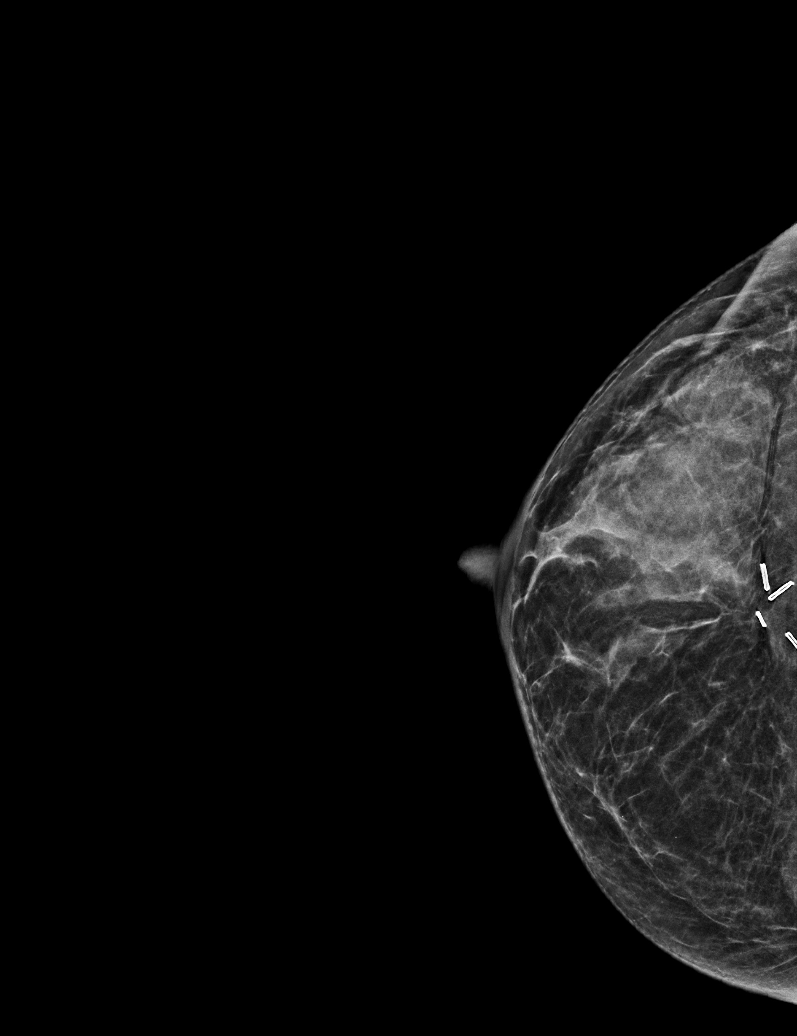

[L CC synth-2D]
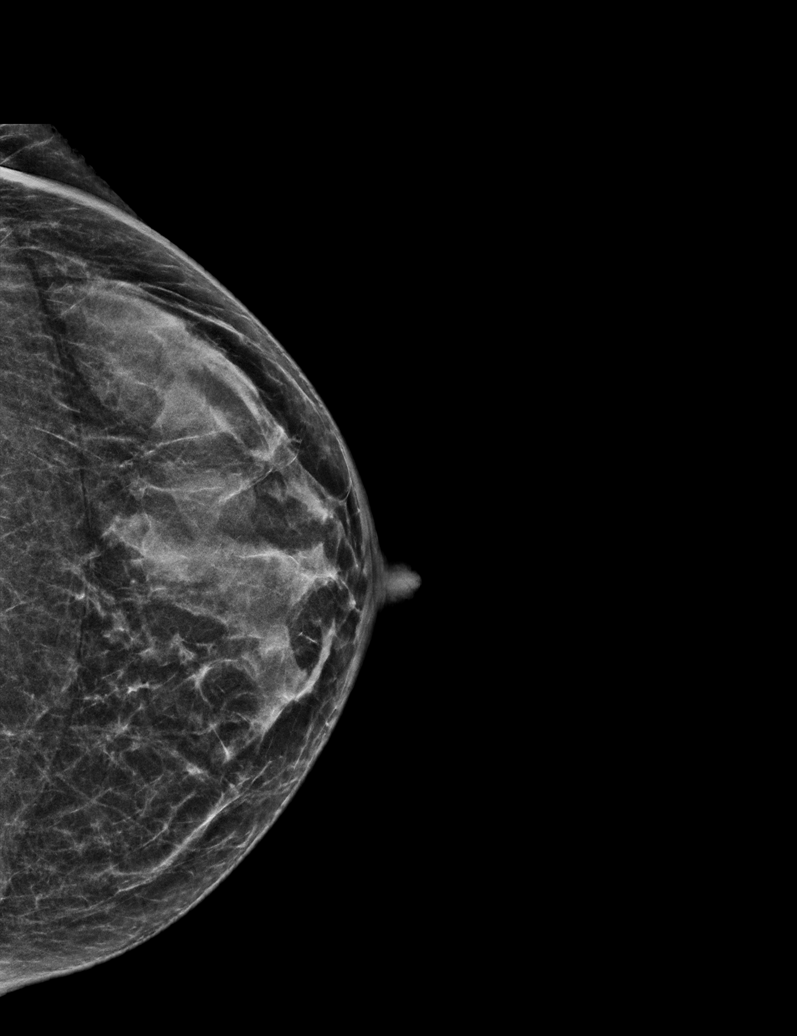

[R CC tomo · tomo slice 21/41.0]
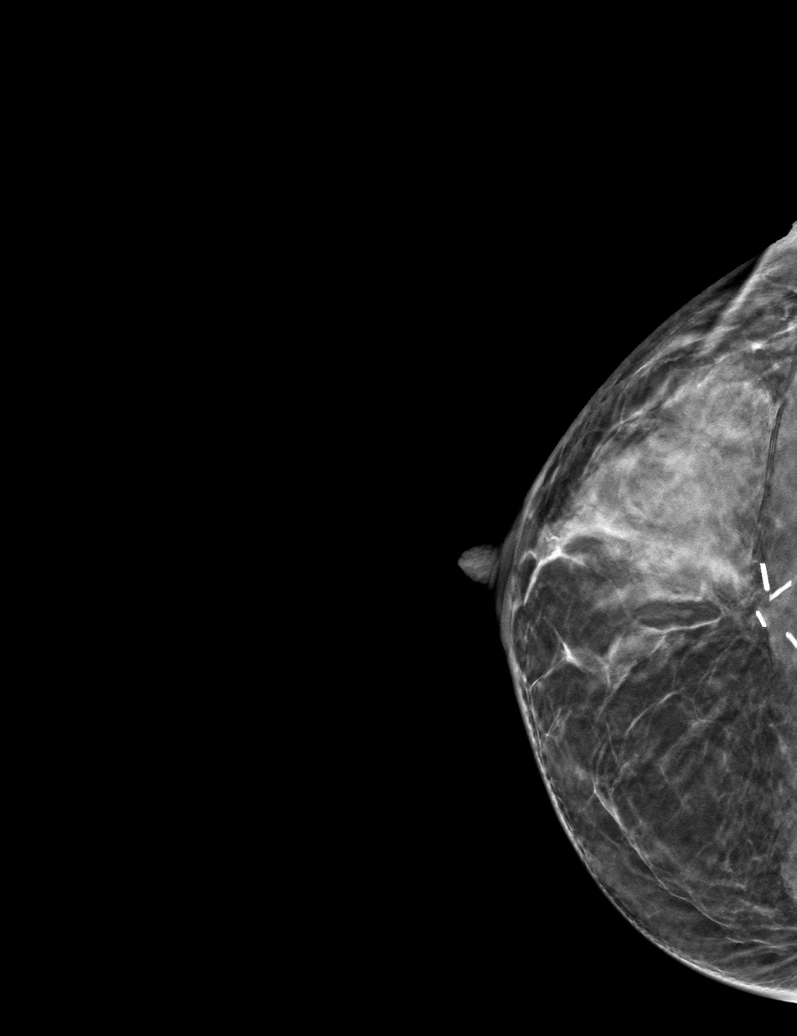

[6 of 25 positions shown; findings below may reference images not displayed]

ACR Breast Density Category c: The breast tissue is heterogeneously
dense, which may obscure small masses.
FINDINGS: The right breast lumpectomy site is stable. No suspicious
calcifications, masses or areas of distortion are seen in the
bilateral breasts.
IMPRESSION: Stable right breast lumpectomy site. No mammographic evidence of
malignancy in the bilateral breasts.

RECOMMENDATION:
Per protocol, as the patient is now 2 or more years status post
lumpectomy, she may return to annual screening mammography in 1
year. However, given the history of breast cancer, the patient
remains eligible for annual diagnostic mammography if preferred. At
this time, the patient strongly desires to continue annual
diagnostic exams.

I have discussed the findings and recommendations with the patient.
If applicable, a reminder letter will be sent to the patient
regarding the next appointment.

BI-RADS CATEGORY  2: Benign.

## 2021-02-20 ENCOUNTER — Other Ambulatory Visit: Payer: Self-pay

## 2021-02-20 ENCOUNTER — Ambulatory Visit
Admission: RE | Admit: 2021-02-20 | Discharge: 2021-02-20 | Disposition: A | Discharge status: Home / Self Care | Payer: 59 | Source: Ambulatory Visit | Attending: Hematology | Admitting: Hematology

## 2021-02-20 DIAGNOSIS — C50411 Malignant neoplasm of upper-outer quadrant of right female breast: Secondary | ICD-10-CM

## 2021-02-20 IMAGING — MR MR BREAST BILAT WO/W CM
8 of 12 series · 33 of 48 positions shown · IV contrast (6ml gadavist)
Comparison: Previous exam(s).

CLINICAL DATA: 50-year-old female presenting for high risk
screening MRI. She has history of right breast cancer (lobular
carcinoma) status post lumpectomy in [DATE].

LABS:  None.
EXAM:
BILATERAL BREAST MRI WITH AND WITHOUT CONTRAST
TECHNIQUE: Multiplanar, multisequence MR images of both breasts were obtained
prior to and following the intravenous administration of 6 ml of
Gadavist

[Series 2: t2_tirm_tra ipat (a-p) · axial · 3.0mm · 0.70mm/px · 1 of 55 slices shown]
[im 1/55]
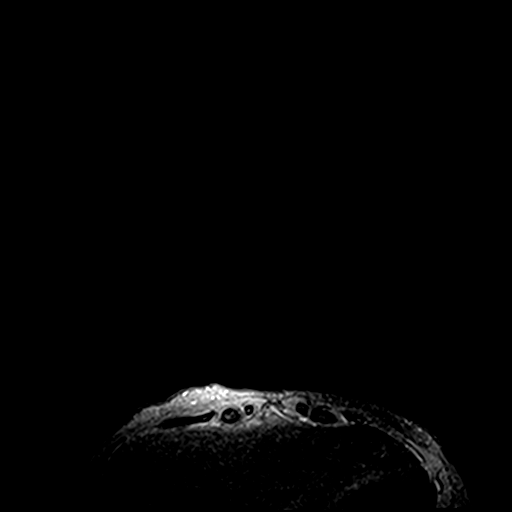

[Series 3: fl3d pre-cm no · axial · non-contrast · 1.2mm · 0.94mm/px · z∈[-67,+104]mm · 5 of 144 slices shown]
[im 1/144]
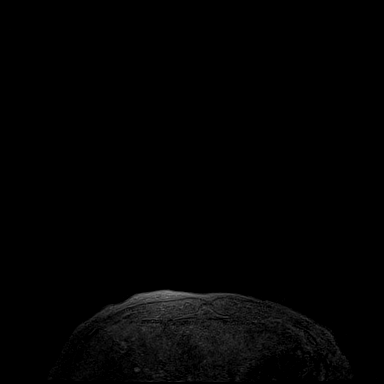
[im 36/144]
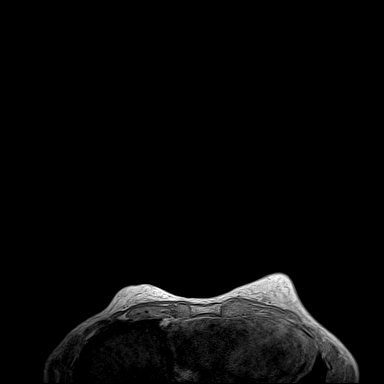
[im 72/144]
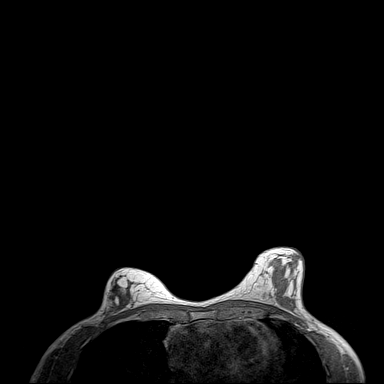
[im 108/144]
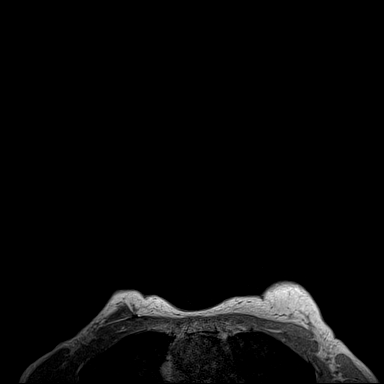
[im 144/144]
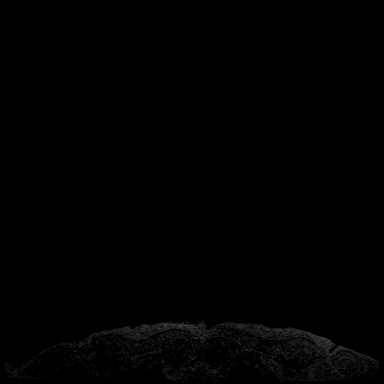

[Series 4: fl3d pre-cm · axial · non-contrast · 1.2mm · 0.94mm/px · z∈[-67,+104]mm · 5 of 144 slices shown]
[im 1/144]
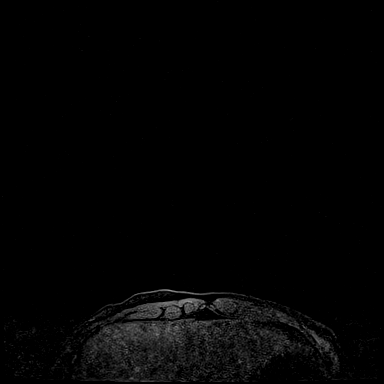
[im 36/144]
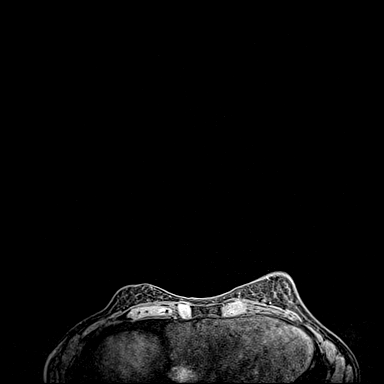
[im 72/144]
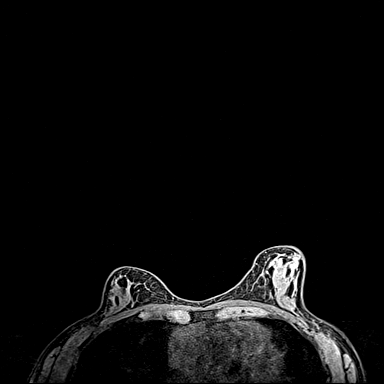
[im 108/144]
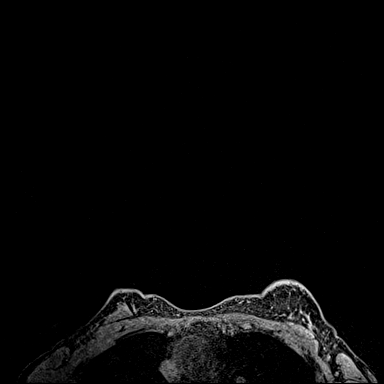
[im 144/144]
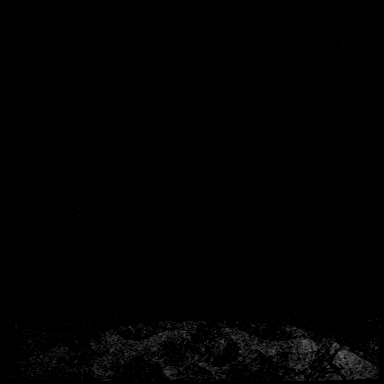

[Series 5: fl3d post immediate · axial · 1.2mm · 0.94mm/px · z∈[-67,+104]mm · 5 of 144 slices shown (1 of 3)]
[im 1/144]
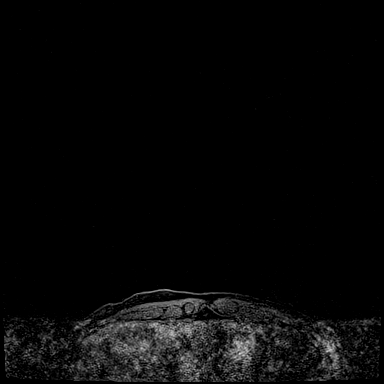
[im 36/144]
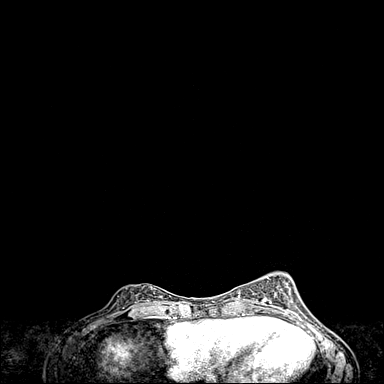
[im 72/144]
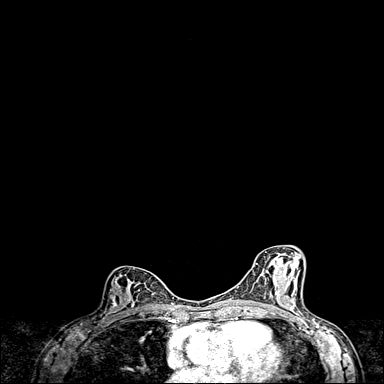
[im 108/144]
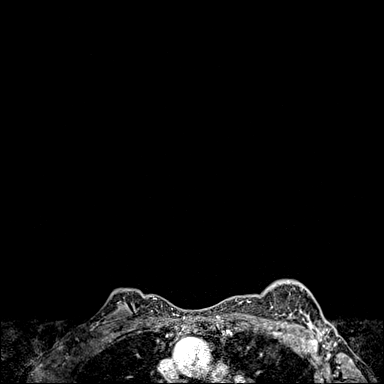
[im 144/144]
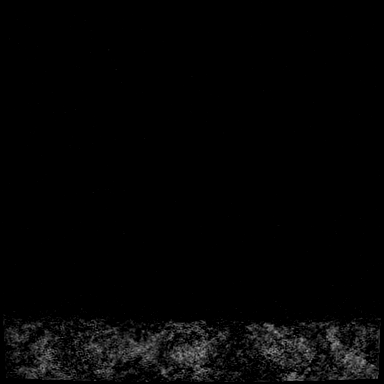

[Series 6: fl3d post immediate · axial · 1.2mm · 0.94mm/px · z∈[-67,+104]mm · 5 of 144 slices shown (2 of 3)]
[im 1/144]
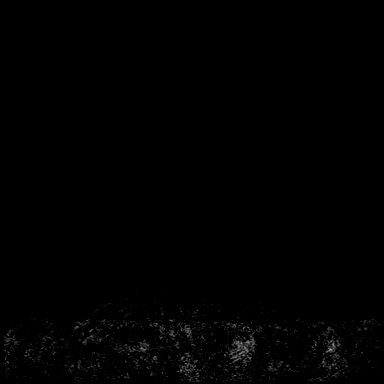
[im 36/144]
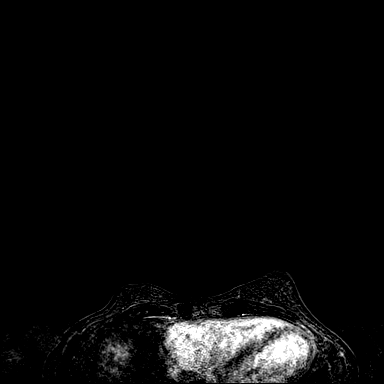
[im 72/144]
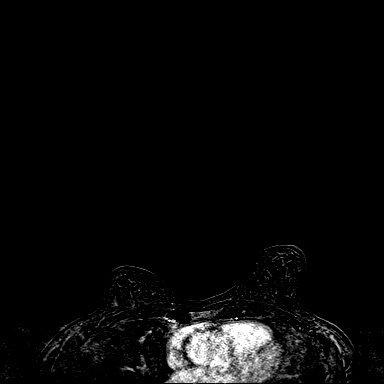
[im 108/144]
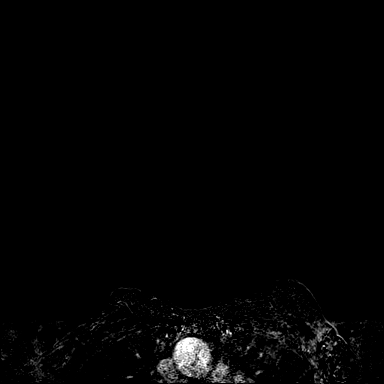
[im 144/144]
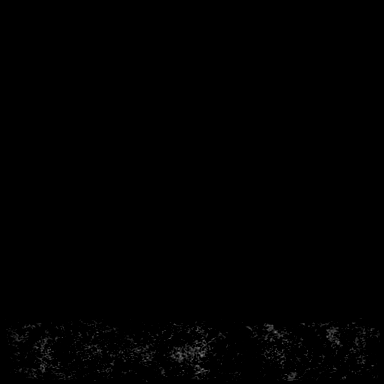

[Series 7: fl3d post immediate · axial · 172.8mm · 0.94mm/px · 1 of 1 slices shown (3 of 3)]
[im 1/1]
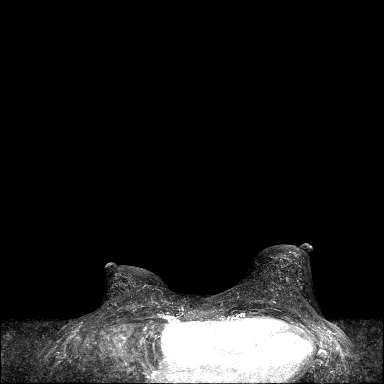

[Series 8: fl3d post 3min · axial · 1.2mm · 0.94mm/px · z∈[-67,+104]mm · 6 of 144 slices shown]
[im 1/144]
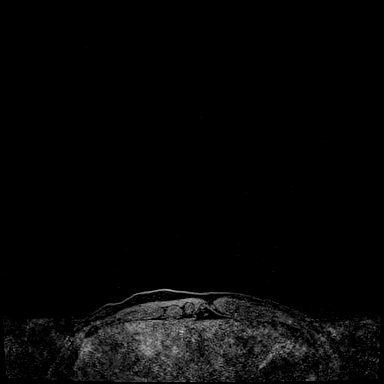
[im 29/144]
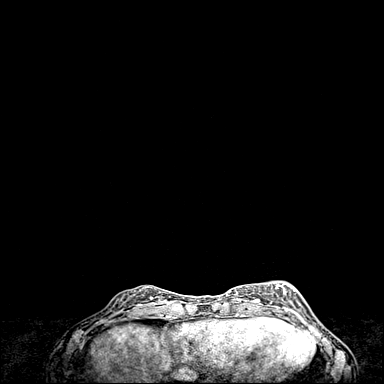
[im 58/144]
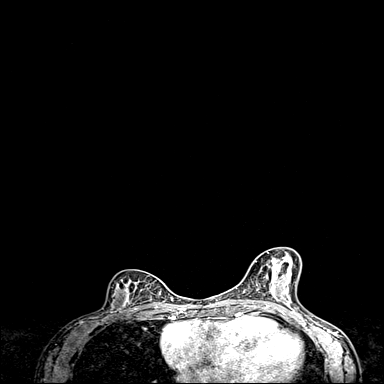
[im 86/144]
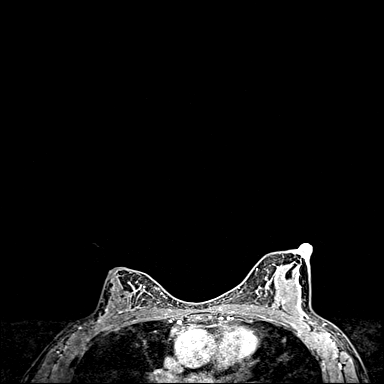
[im 115/144]
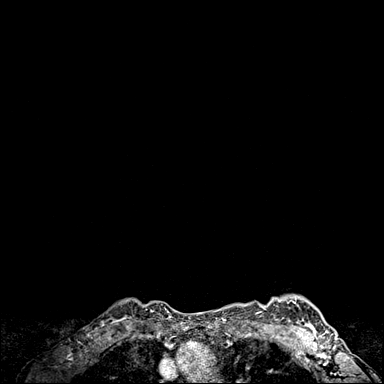
[im 144/144]
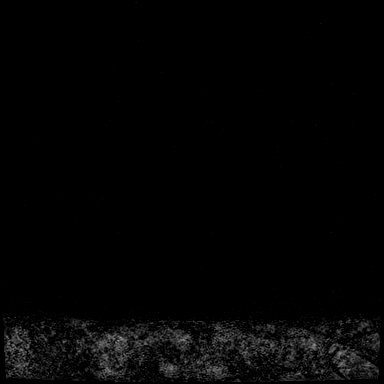

[Series 9: fl3d post 3min_sub · axial · 1.2mm · 0.94mm/px · z∈[-67,+69]mm · 5 of 144 slices shown]
[im 1/144]
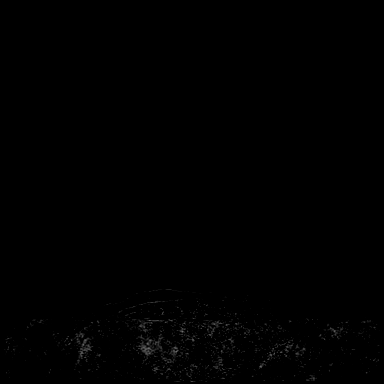
[im 29/144]
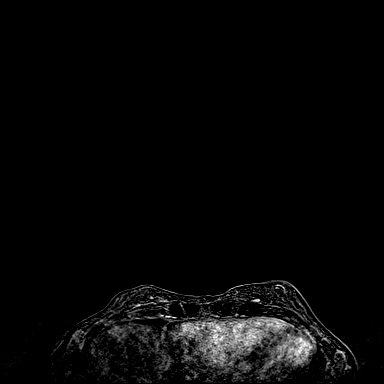
[im 58/144]
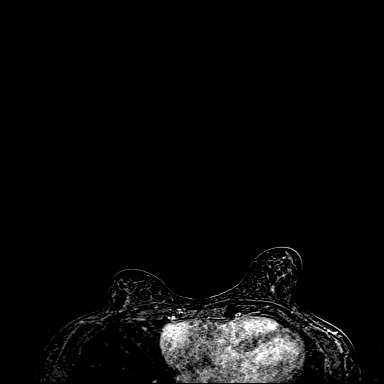
[im 86/144]
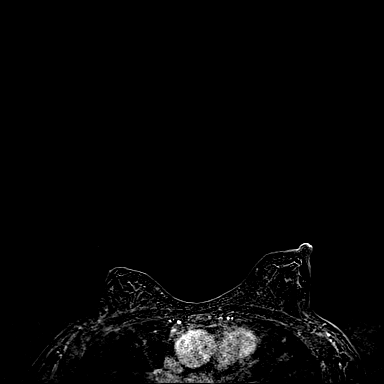
[im 115/144]
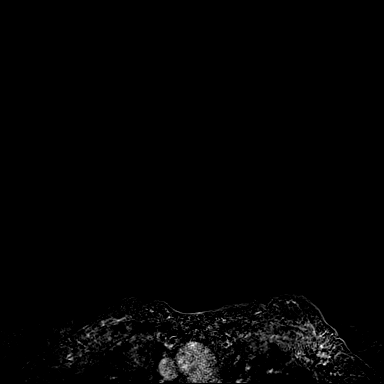

[33 of 48 positions shown; findings below may reference images not displayed]

Three-dimensional MR images were rendered by post-processing of the
original MR data on an independent workstation. The
three-dimensional MR images were interpreted, and findings are
reported in the following complete MRI report for this study. Three
dimensional images were evaluated at the independent interpreting
workstation using the DynaCAD thin client.
FINDINGS: Breast composition: c. Heterogeneous fibroglandular tissue.

Background parenchymal enhancement: Mild.

Right breast: No mass or abnormal enhancement.

Left breast: In the inferior posterior left breast, there are 2
areas of ill-defined non mass enhancement. One area that is more
linearly oriented is seen in series 9, image 88 measuring 1 cm, and
a smaller area in image 83 measuring 0.6 cm.

Lymph nodes: No abnormal appearing lymph nodes.

Ancillary findings:  None.
IMPRESSION: 1. There are 2 adjacent focal areas of non mass enhancement in the
inferior posterior left breast.

2.  No evidence of right breast malignancy.

RECOMMENDATION:
MRI guided biopsy is recommended for the 2 areas of non mass
enhancement in the inferior posterior left breast. These areas are
very close together, and may be able to be sampled as 1 biopsy site,
however this will need to be determined by positioning the day of
the biopsy.

BI-RADS CATEGORY  4: Suspicious.

## 2021-02-20 MED ORDER — GADOBUTROL 1 MMOL/ML IV SOLN
6.0000 mL | Freq: Once | INTRAVENOUS | Status: AC | PRN
  Administered 2021-02-20: 6 mL via INTRAVENOUS

## 2021-02-21 ENCOUNTER — Other Ambulatory Visit: Payer: Self-pay | Admitting: Hematology

## 2021-02-21 ENCOUNTER — Telehealth: Payer: Self-pay | Admitting: Hematology

## 2021-02-21 DIAGNOSIS — R9389 Abnormal findings on diagnostic imaging of other specified body structures: Secondary | ICD-10-CM

## 2021-02-21 NOTE — Telephone Encounter (Signed)
Rescheduled upcoming appointment due to patient testing positive for COVID-19 9/30. Patient is aware of changes.

## 2021-02-24 ENCOUNTER — Inpatient Hospital Stay: Payer: 59 | Admitting: Hematology

## 2021-02-24 ENCOUNTER — Inpatient Hospital Stay: Payer: 59

## 2021-02-24 NOTE — Progress Notes (Signed)
Spoke with pt via telephone regarding MRI results per Dr. Ernestina Penna request.  Radiologist recommended a breast biopsy which Dr. Burr Medico agrees.  Pt is scheduled for breast biopsy on 03/03/2021.  Pt verbalized understanding of recommendation from Dr. Burr Medico and the radiologist regarding her MRI results.

## 2021-03-03 ENCOUNTER — Other Ambulatory Visit (HOSPITAL_COMMUNITY): Payer: Self-pay | Admitting: Diagnostic Radiology

## 2021-03-03 ENCOUNTER — Other Ambulatory Visit: Payer: Self-pay

## 2021-03-03 ENCOUNTER — Ambulatory Visit
Admission: RE | Admit: 2021-03-03 | Discharge: 2021-03-03 | Disposition: A | Discharge status: Home / Self Care | Payer: 59 | Source: Ambulatory Visit | Attending: Hematology | Admitting: Hematology

## 2021-03-03 DIAGNOSIS — R9389 Abnormal findings on diagnostic imaging of other specified body structures: Secondary | ICD-10-CM

## 2021-03-03 IMAGING — MR MR BREAST BX W LOC DEV 1ST LESION IMAGE BX SPEC MR GUIDE*L*
8 of 12 series · 32 of 48 positions shown · IV contrast (5ML GADAVIST)
Comparison: Previous exams.
COMPARISON: Previous exams.

Addendum:
CLINICAL DATA: Patient presents for MR guided core biopsy of the
LEFT breast.

EXAM:
MRI GUIDED CORE NEEDLE BIOPSY OF THE LEFT BREAST
TECHNIQUE: Multiplanar, multisequence MR imaging of the LEFT breast was
performed both before and after administration of intravenous
contrast.
CONTRAST:  5 ml Gadavist

[Series 2: fiducial unilateral · sagittal · 2.0mm · 1.33mm/px · 3 of 52 slices shown]
[im 1/52]
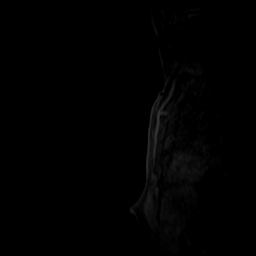
[im 26/52]
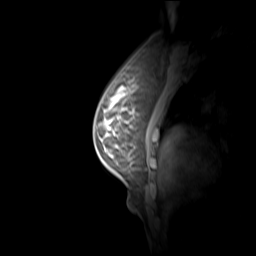
[im 52/52]
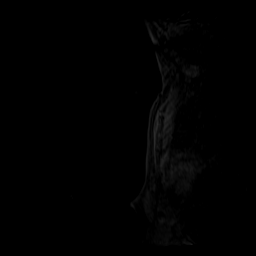

[Series 3: dynamic pre · axial · non-contrast · 1.3mm · 0.73mm/px · z∈[-78,+108]mm · 5 of 144 slices shown]
[im 1/144]
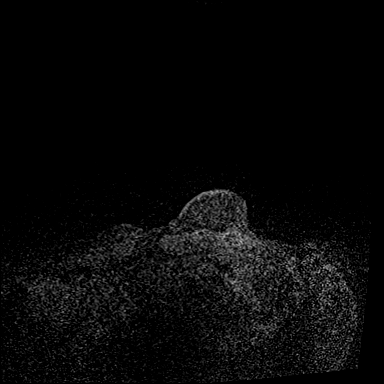
[im 36/144]
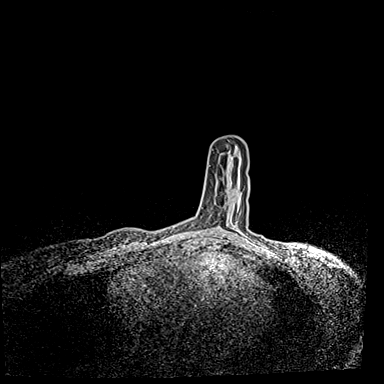
[im 72/144]
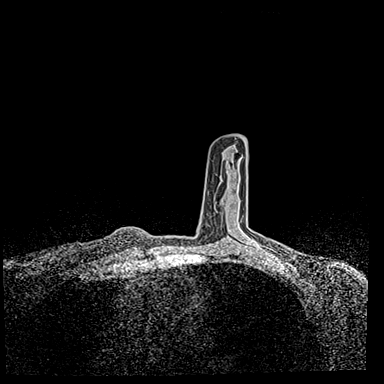
[im 108/144]
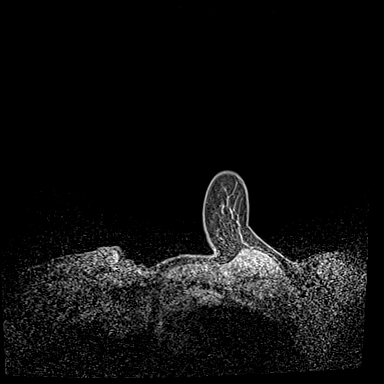
[im 144/144]
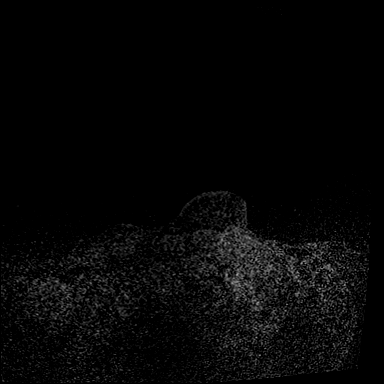

[Series 4: dynamic post 20 · axial · 1.3mm · 0.73mm/px · z∈[-78,+108]mm · 4 of 144 slices shown (1 of 2)]
[im 1/144]
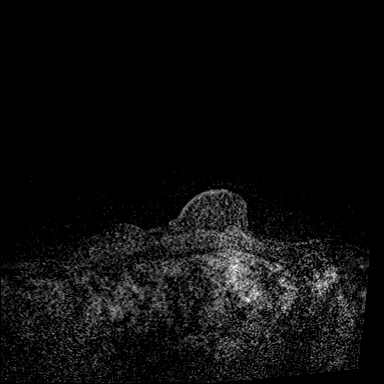
[im 48/144]
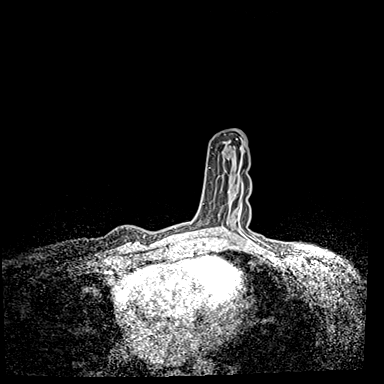
[im 96/144]
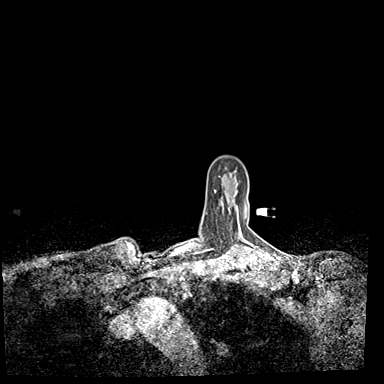
[im 144/144]
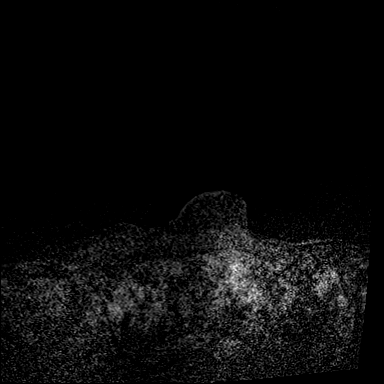

[Series 5: dynamic post 20 · axial · 1.3mm · 0.73mm/px · z∈[-78,+108]mm · 4 of 144 slices shown (2 of 2)]
[im 1/144]
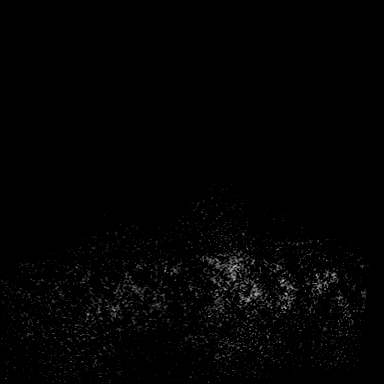
[im 48/144]
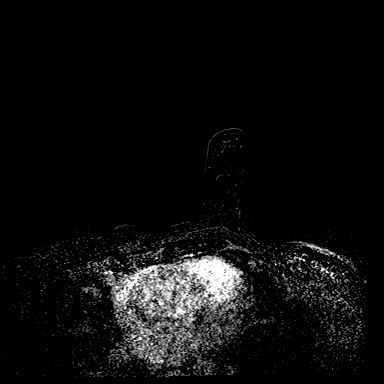
[im 96/144]
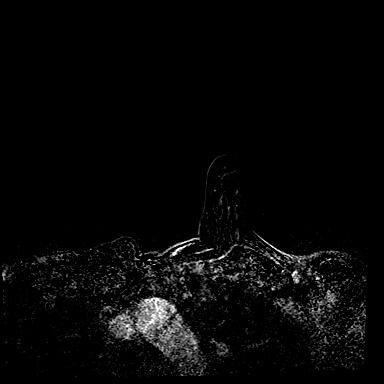
[im 144/144]
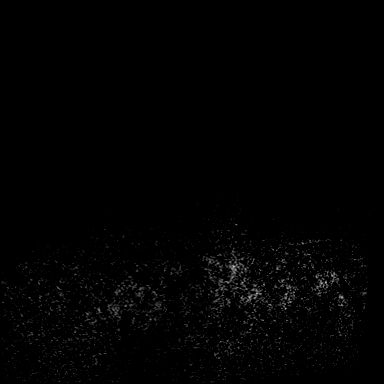

[Series 6: dynamic post 3 · axial · 1.3mm · 0.73mm/px · z∈[-78,+108]mm · 4 of 144 slices shown (1 of 2)]
[im 1/144]
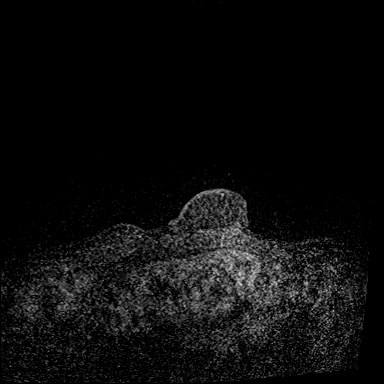
[im 48/144]
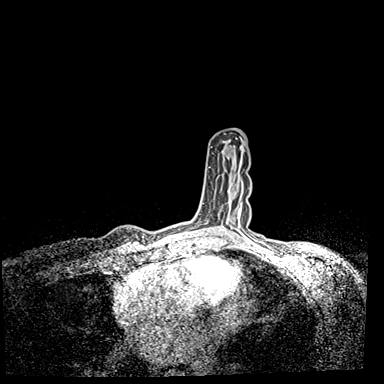
[im 96/144]
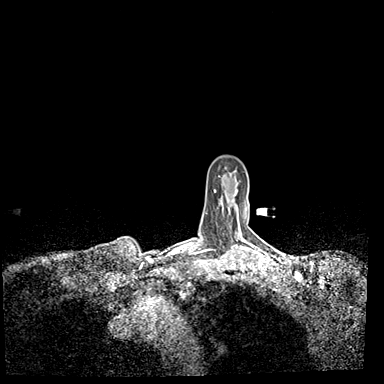
[im 144/144]
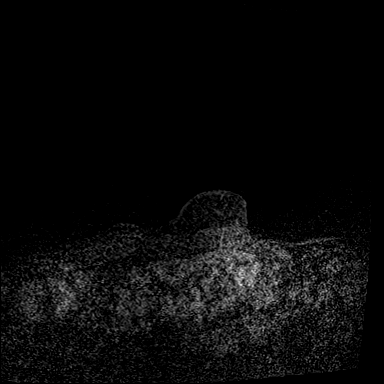

[Series 7: dynamic post 3 · axial · 1.3mm · 0.73mm/px · z∈[-78,+108]mm · 4 of 144 slices shown (2 of 2)]
[im 1/144]
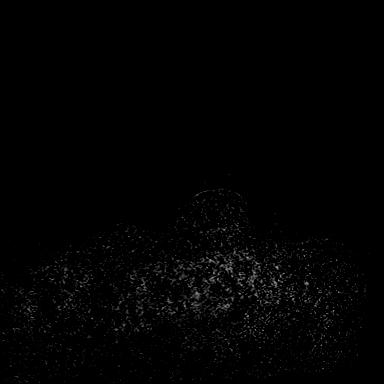
[im 48/144]
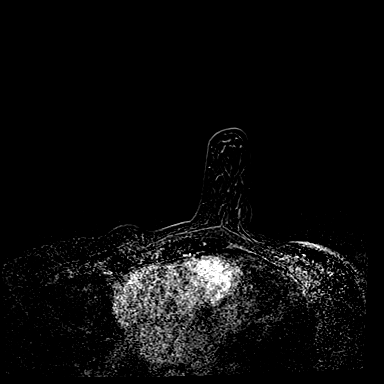
[im 96/144]
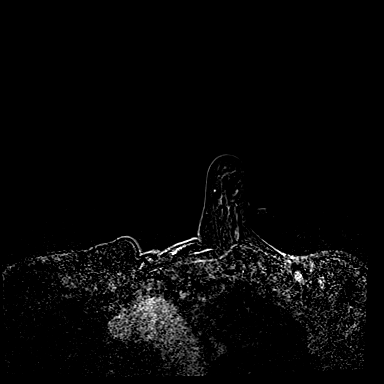
[im 144/144]
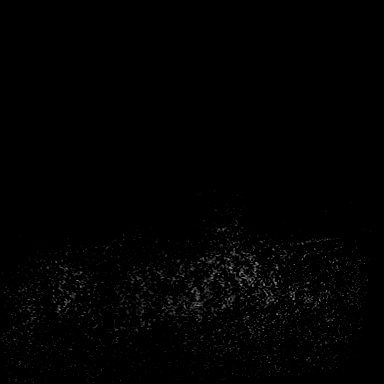

[Series 8: dynamic post 6 · axial · 1.3mm · 0.73mm/px · z∈[-78,+108]mm · 4 of 144 slices shown (1 of 2)]
[im 1/144]
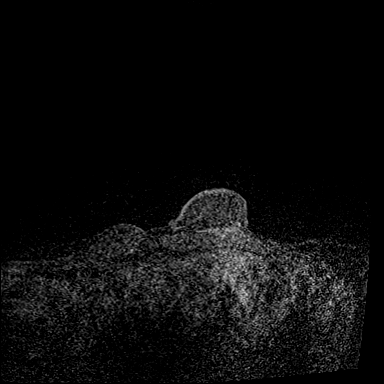
[im 48/144]
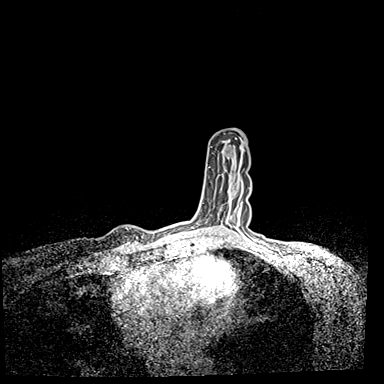
[im 96/144]
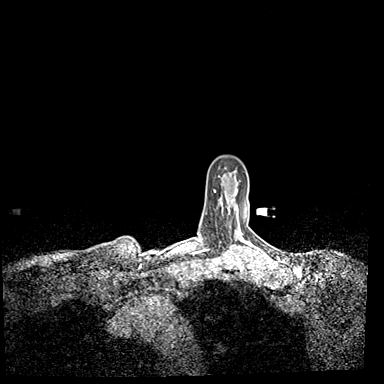
[im 144/144]
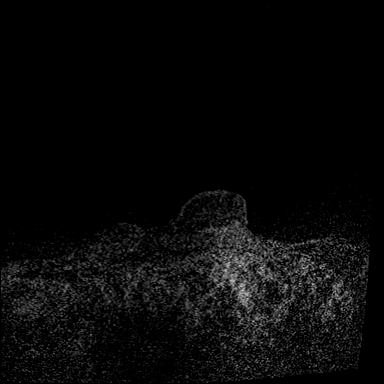

[Series 9: dynamic post 6 · axial · 1.3mm · 0.73mm/px · z∈[-78,+108]mm · 4 of 144 slices shown (2 of 2)]
[im 1/144]
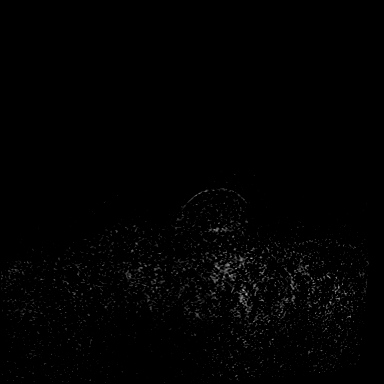
[im 48/144]
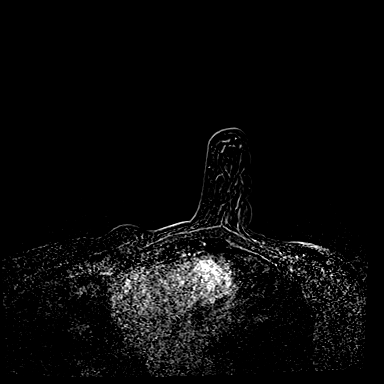
[im 96/144]
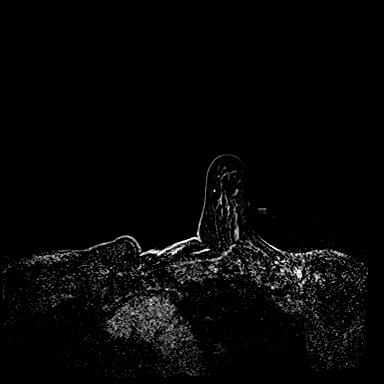
[im 144/144]
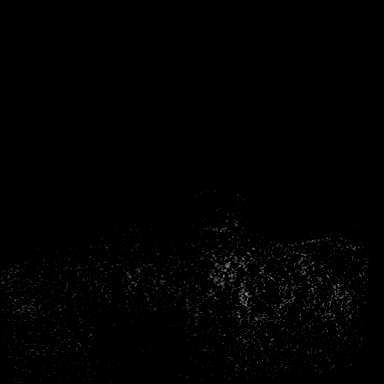

[32 of 48 positions shown; findings below may reference images not displayed]

FINDINGS: I met with the patient, and we discussed the procedure of MRI guided
biopsy, including risks, benefits, and alternatives. Specifically,
we discussed the risks of infection, bleeding, tissue injury, clip
migration, and inadequate sampling. Informed, written consent was
given. The usual time out protocol was performed immediately prior
to the procedure.

Using sterile technique, 1% Lidocaine, MRI guidance, and a 9 gauge
vacuum assisted device, biopsy was performed of linear focal non
mass enhancement in the posterior LOWER OUTER QUADRANT of the LEFT
breast using a LATERAL approach. At the conclusion of the procedure,
a barbell shaped tissue marker clip was deployed into the biopsy
cavity.

Biopsy is complicated by small hematoma. Compression dressing was
applied.

A second focal area of non mass enhancement is not identified on the
images today.

Follow-up 2-view mammogram was performed and dictated separately.
IMPRESSION: MRI guided biopsy of focal non mass enhancement in the posterior
LOWER OUTER QUADRANT of the LEFT breast.

A second area of non mass enhancement is not identified on the
images today.

Small hematoma at biopsy site.

ADDENDUM:
Pathology revealed FIBROCYSTIC CHANGES INCLUDING APOCRINE
METAPLASIA, FIBROADENOMATOID CHANGE of the LEFT breast, lower outer
quadrant posterior, (barbell clip). This was found to be concordant
by Dr. JORDIN.

Pathology results were discussed with the patient by telephone. The
patient reported doing well after the biopsy with tenderness and
fullness at the site. NO additional bleeding noted after compression
bandage applied at time of biopsy. Post biopsy instructions and care
were reviewed and questions were answered. The patient was
encouraged to call The [REDACTED] for any
additional concerns. My direct phone number was provided.

The patient was instructed to return for a bilateral breast MRI in 6
months, per protocol, and to continue with annual mammography.

Pathology results reported by JORDIN, RN on [DATE].

*** End of Addendum ***
FINDINGS: I met with the patient, and we discussed the procedure of MRI guided
biopsy, including risks, benefits, and alternatives. Specifically,
we discussed the risks of infection, bleeding, tissue injury, clip
migration, and inadequate sampling. Informed, written consent was
given. The usual time out protocol was performed immediately prior
to the procedure.

Using sterile technique, 1% Lidocaine, MRI guidance, and a 9 gauge
vacuum assisted device, biopsy was performed of linear focal non
mass enhancement in the posterior LOWER OUTER QUADRANT of the LEFT
breast using a LATERAL approach. At the conclusion of the procedure,
a barbell shaped tissue marker clip was deployed into the biopsy
cavity.

Biopsy is complicated by small hematoma. Compression dressing was
applied.

A second focal area of non mass enhancement is not identified on the
images today.

Follow-up 2-view mammogram was performed and dictated separately.
IMPRESSION: MRI guided biopsy of focal non mass enhancement in the posterior
LOWER OUTER QUADRANT of the LEFT breast.

A second area of non mass enhancement is not identified on the
images today.

Small hematoma at biopsy site.

## 2021-03-03 IMAGING — MG MM BREAST LOCALIZATION CLIP
4 series · 4 of 12 positions shown · non-contrast
Comparison: Previous exam(s).

CLINICAL DATA: Status post MR guided core biopsy of the LEFT
breast.

EXAM:
3D DIAGNOSTIC LEFT MAMMOGRAM POST MRI BIOPSY

[L ML synth-2D]
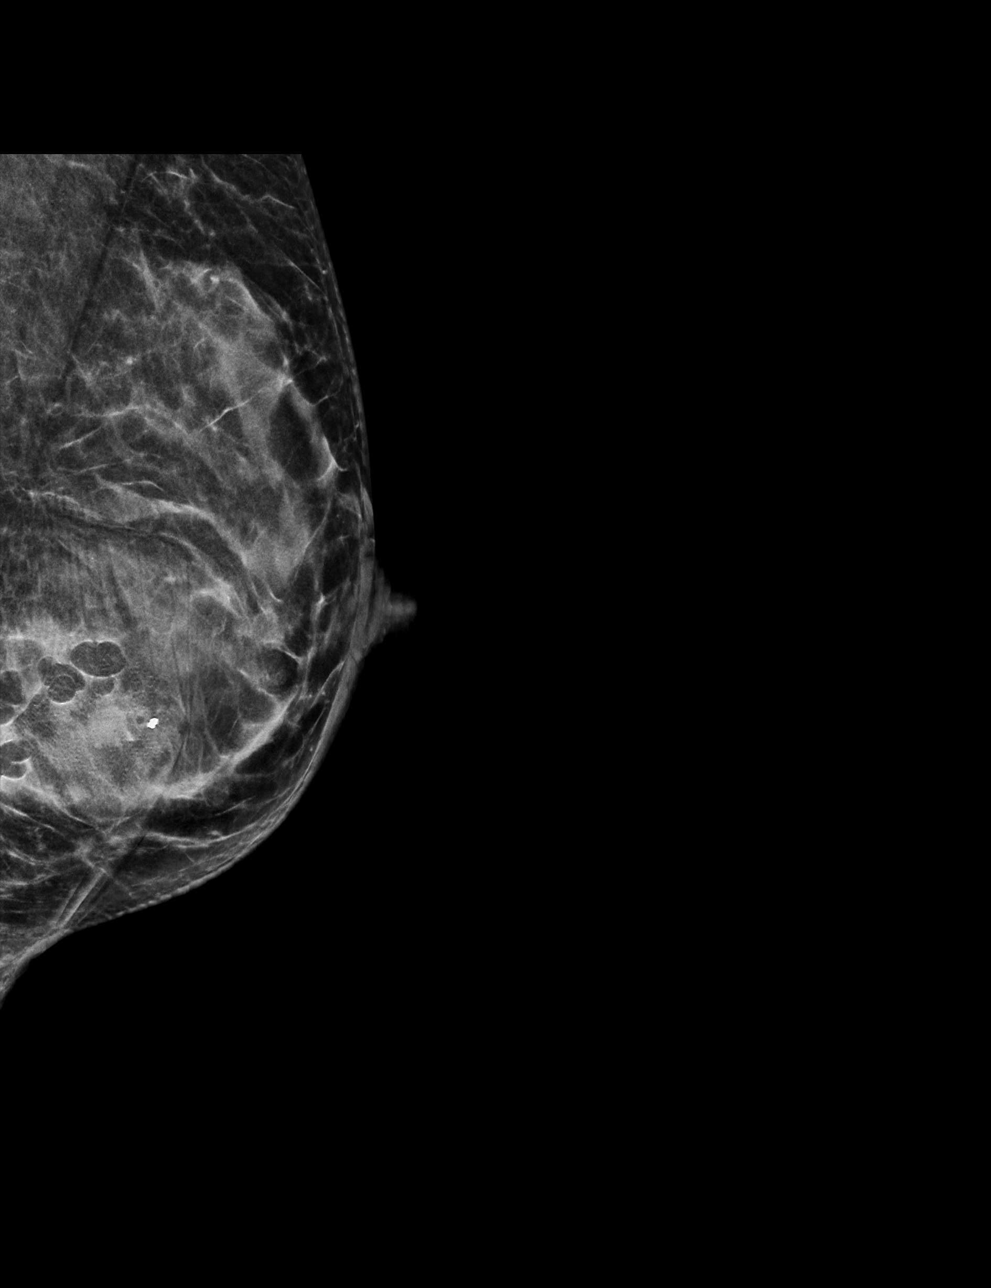

[L CC synth-2D]
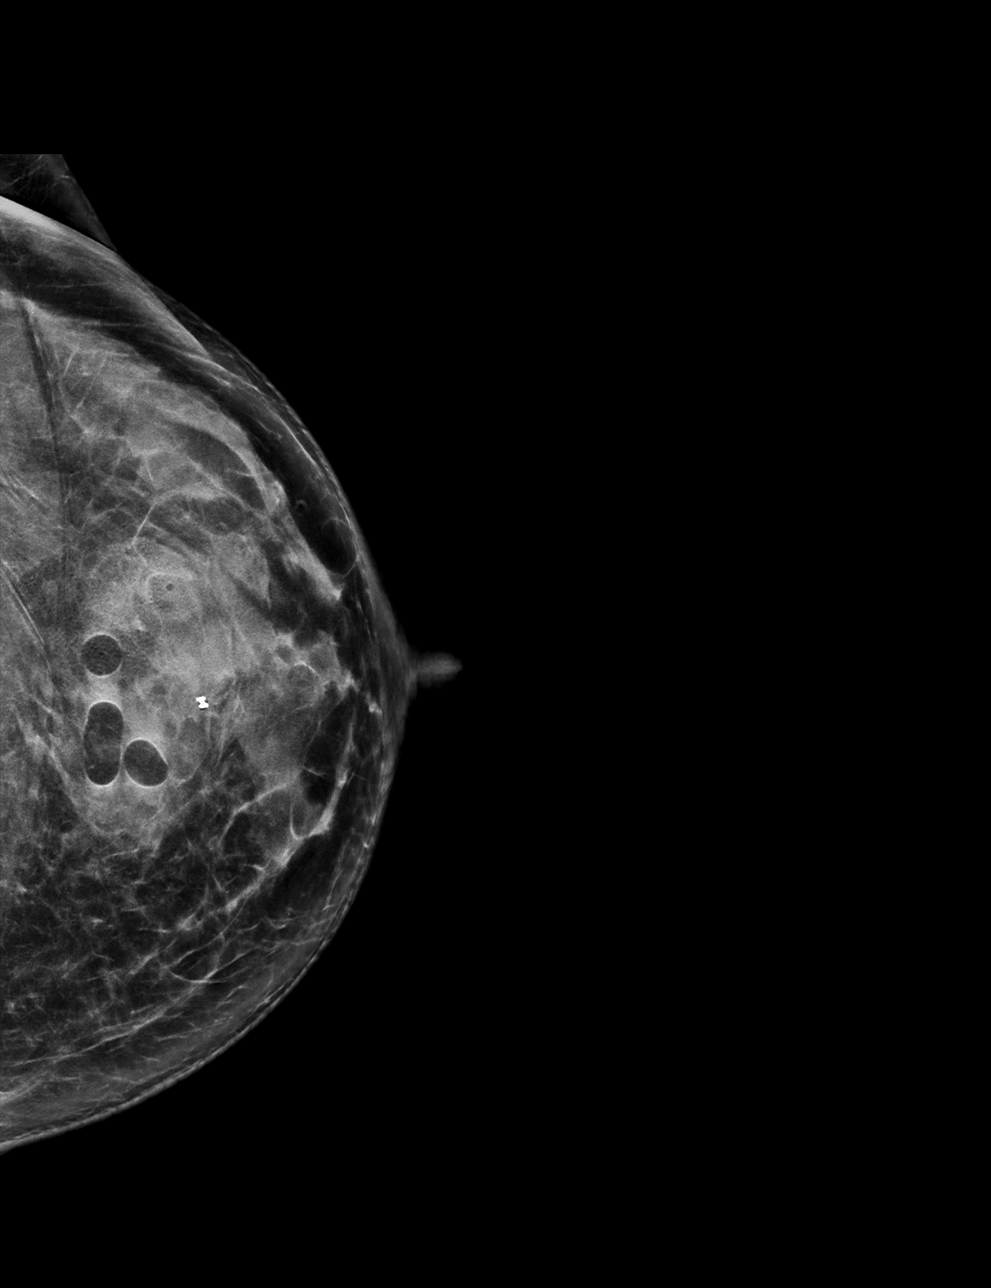

[L CC tomo · tomo slice 29/57.0]
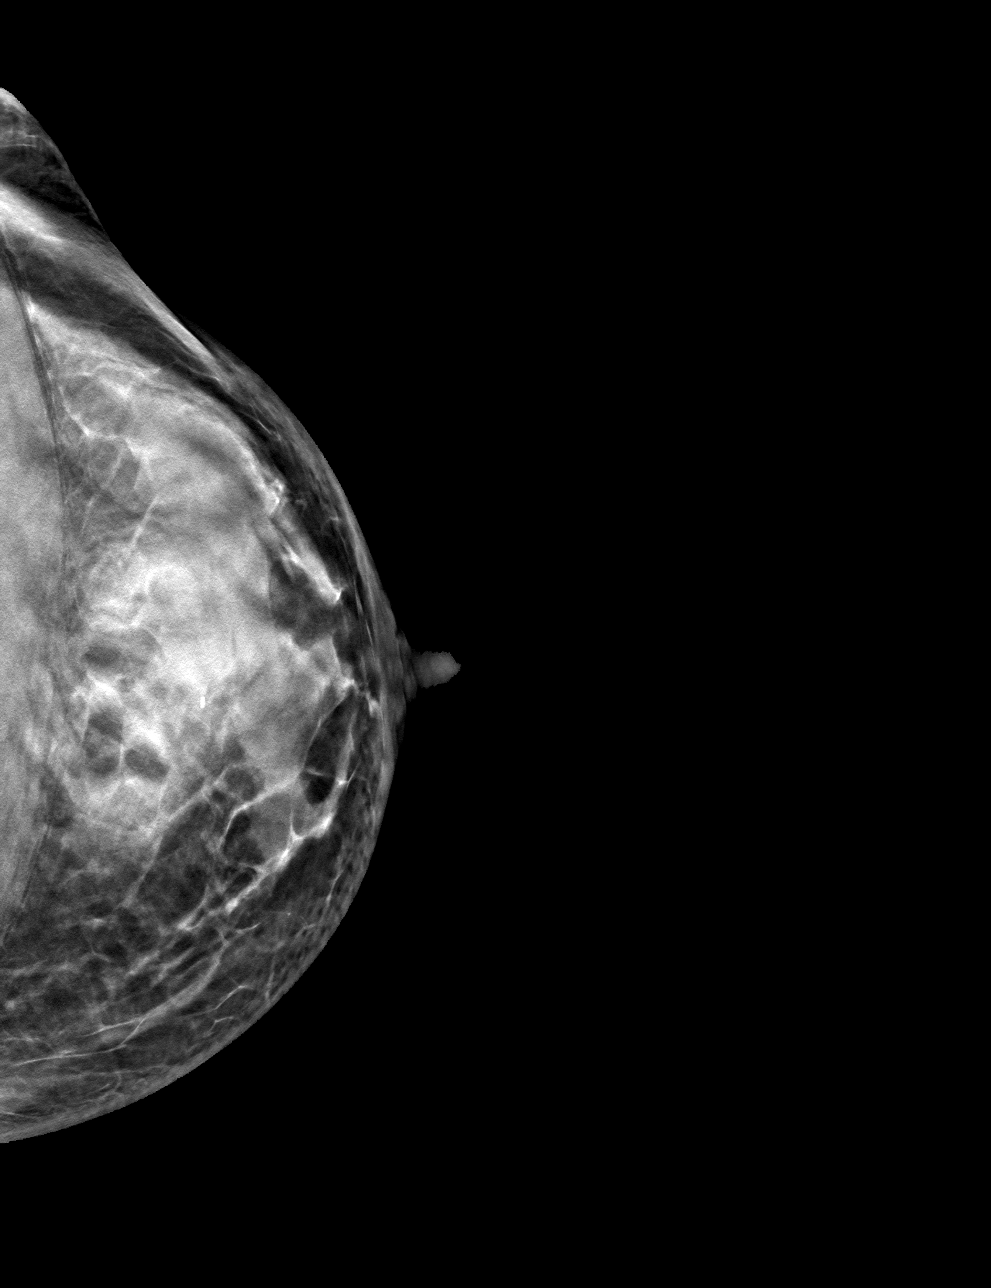

[L ML tomo · tomo slice 27/52.0]
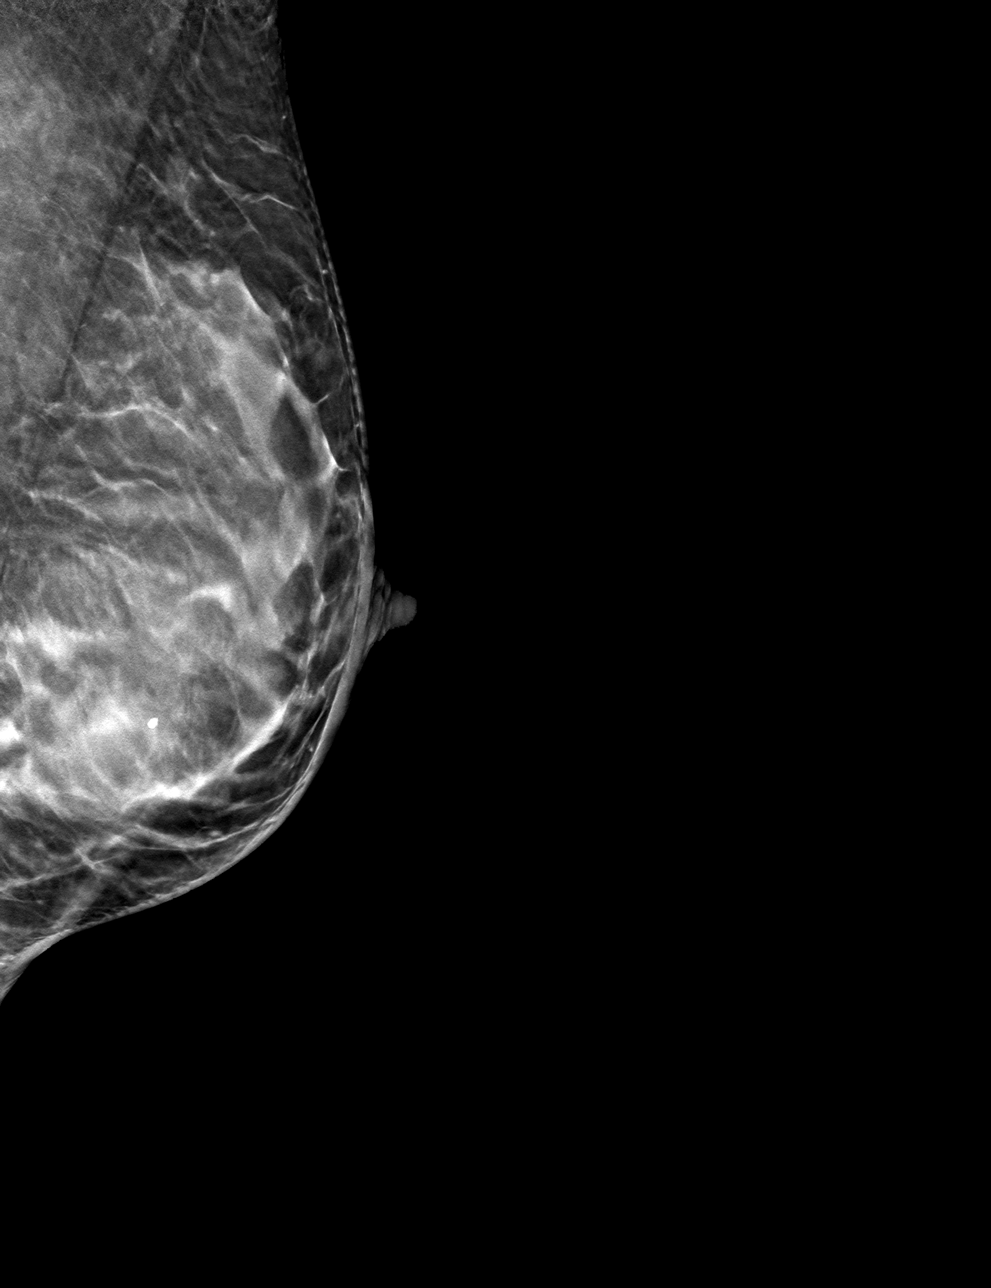

[4 of 12 positions shown; findings below may reference images not displayed]

FINDINGS: 3D Mammographic images were obtained following MRI guided biopsy of
focal non mass enhancement in the posterior LOWER OUTER QUADRANT of
the LEFT breast and placement a barbell shaped clip. The biopsy
marking clip is in expected position at the site of biopsy.
IMPRESSION: Appropriate positioning of the barbell shaped biopsy marking clip at
the site of biopsy in the posterior LOWER OUTER QUADRANT LEFT
breast. Small hematoma is identified at the biopsy site. Compression
dressing was applied.

Final Assessment: Post Procedure Mammograms for Marker Placement

## 2021-03-07 ENCOUNTER — Encounter: Payer: Self-pay | Admitting: Hematology

## 2021-03-07 ENCOUNTER — Inpatient Hospital Stay: Payer: 59 | Attending: Hematology

## 2021-03-07 ENCOUNTER — Inpatient Hospital Stay (HOSPITAL_BASED_OUTPATIENT_CLINIC_OR_DEPARTMENT_OTHER): Payer: 59 | Admitting: Hematology

## 2021-03-07 ENCOUNTER — Other Ambulatory Visit: Payer: Self-pay

## 2021-03-07 VITALS — BP 139/100 | HR 65 | Temp 98.5°F | Resp 17 | Wt 137.5 lb

## 2021-03-07 DIAGNOSIS — C50411 Malignant neoplasm of upper-outer quadrant of right female breast: Secondary | ICD-10-CM

## 2021-03-07 DIAGNOSIS — Z923 Personal history of irradiation: Secondary | ICD-10-CM | POA: Diagnosis not present

## 2021-03-07 DIAGNOSIS — Z79810 Long term (current) use of selective estrogen receptor modulators (SERMs): Secondary | ICD-10-CM | POA: Insufficient documentation

## 2021-03-07 DIAGNOSIS — Z170 Estrogen receptor positive status [ER+]: Secondary | ICD-10-CM | POA: Diagnosis not present

## 2021-03-07 DIAGNOSIS — Z79899 Other long term (current) drug therapy: Secondary | ICD-10-CM | POA: Insufficient documentation

## 2021-03-07 DIAGNOSIS — G4700 Insomnia, unspecified: Secondary | ICD-10-CM | POA: Diagnosis not present

## 2021-03-07 LAB — CBC WITH DIFFERENTIAL/PLATELET
Abs Immature Granulocytes: 0.01 10*3/uL (ref 0.00–0.07)
Basophils Absolute: 0 10*3/uL (ref 0.0–0.1)
Basophils Relative: 0 %
Eosinophils Absolute: 0 10*3/uL (ref 0.0–0.5)
Eosinophils Relative: 0 %
HCT: 38.1 % (ref 36.0–46.0)
Hemoglobin: 13.2 g/dL (ref 12.0–15.0)
Immature Granulocytes: 0 %
Lymphocytes Relative: 34 %
Lymphs Abs: 2.3 10*3/uL (ref 0.7–4.0)
MCH: 32.5 pg (ref 26.0–34.0)
MCHC: 34.6 g/dL (ref 30.0–36.0)
MCV: 93.8 fL (ref 80.0–100.0)
Monocytes Absolute: 0.6 10*3/uL (ref 0.1–1.0)
Monocytes Relative: 9 %
Neutro Abs: 3.8 10*3/uL (ref 1.7–7.7)
Neutrophils Relative %: 57 %
Platelets: 240 10*3/uL (ref 150–400)
RBC: 4.06 MIL/uL (ref 3.87–5.11)
RDW: 12.1 % (ref 11.5–15.5)
WBC: 6.8 10*3/uL (ref 4.0–10.5)
nRBC: 0 % (ref 0.0–0.2)

## 2021-03-07 LAB — CMP (CANCER CENTER ONLY)
ALT: 19 U/L (ref 0–44)
AST: 23 U/L (ref 15–41)
Albumin: 4.4 g/dL (ref 3.5–5.0)
Alkaline Phosphatase: 45 U/L (ref 38–126)
Anion gap: 8 (ref 5–15)
BUN: 16 mg/dL (ref 6–20)
CO2: 27 mmol/L (ref 22–32)
Calcium: 9.6 mg/dL (ref 8.9–10.3)
Chloride: 103 mmol/L (ref 98–111)
Creatinine: 1 mg/dL (ref 0.44–1.00)
GFR, Estimated: >60 mL/min (ref >60)
Glucose, Bld: 89 mg/dL (ref 70–99)
Potassium: 4.3 mmol/L (ref 3.5–5.1)
Sodium: 138 mmol/L (ref 135–145)
Total Bilirubin: 1.1 mg/dL (ref 0.3–1.2)
Total Protein: 7.2 g/dL (ref 6.5–8.1)

## 2021-03-07 NOTE — Progress Notes (Signed)
[place]   Telephone:([phone] Fax:([phone]) [phone]   Clinic Follow up Note   Patient Care Team: [name], [name], MD as PCP - General (Family Medicine) Erroll Luna, MD as Consulting Physician (General Surgery) Kyung Rudd, MD as Consulting Physician (Radiation Oncology) Truitt Merle, MD as Consulting Physician (Hematology) Delice Bison, Charlestine Massed, NP as Nurse Practitioner (Hematology and Oncology) Marylynn Pearson, MD as Consulting Physician (Obstetrics and Gynecology)  Date of Service:  03/07/2021  CHIEF COMPLAINT: f/u of right breast cancer  CURRENT THERAPY:  Tamoxifen 48m daily started in 12/07/16  ASSESSMENT & PLAN:  APolly Barneris a 50y.o. female with   1. Malignant new [place] of upper-outer quadrant of right breast, stage IA (pT1bN0M0), Invasive Lobular Carcinoma, ER/PR Positive, Her2 Negative, Grade I  -She was diagnosed in 07/2016. She is s/p right lumpectomy with SLNB and adjuvant radiation. No Oncotype Dx testing done.  -She started Tamoxifen in 11/2016, experiencing manageable hot flashes.  -Last period 05/2018. Her 01/2020 FSH was still pre-menopausal. Will repeat a year later.  -genetic testing with her GYN was negative. -most recent breast imaging was MRI on 02/20/21 showing two adjacent areas of non-mass enhancement. Biopsy on 03/03/21 was benign. -She is clinically doing well. Lab reviewed, her CBC is within normal limits, CMP is pending. There is no clinical concern for recurrence. -Next Mammogram in 07/2021. -Continue Tamoxifen.  -F/u in 8 months    2. Bone Health  -May consider AI if she is postmenopausal. Will obtain baseline DEXA when she becomes post-menopausal or before switching to AI.     3. Insomnia and hot flushes  -Insomnia secondary to hot flashes. She is no longer on Xanax, manageable on Zquil.  -hot flush is manageable    4. Weight Loss  -She notes recent weight gain and interested in weight loss. I discussed  Tamoxifen can lead to weight gain. -she has successfully lost 5 lbs in the last 7 months.   5. Vision change -She has had decreased vision in recent years. She continues to f/u with ophthalmologist.      PLAN: -Continue tamoxifen -Mammogram in 07/2021 and MRI in 08/2021 -Lab and F/u in 8 months    No problem-specific Assessment & Plan notes found for this encounter.   SUMMARY OF ONCOLOGIC HISTORY: Oncology History Overview Note  Cancer Staging Malignant neoplasm of upper-outer quadrant of right breast in female, estrogen receptor positive ([place] Staging form: Breast, AJCC 8th Edition - Pathologic stage from 08/27/2016: Stage IA (pT1a(m), pN0, cM0, G1, ER: Positive, PR: Positive, HER2: Negative) - Signed by YTruitt Merle MD on 09/09/2016 - Pathologic: No stage assigned - Unsigned     Malignant neoplasm of upper-outer quadrant of right breast in female, estrogen receptor positive ([place]  08/05/2016 Mammogram   Targeted ultrasound of the right breast was performed demonstrating an irregular hypoechoic mass at 12:30, 5 cm from the nipple measuring 6 x 4 x 7 mm which is thought to correspond to the area of distortion seen mammographically. Targeted ultrasound of the right axilla demonstrates no suspicious appearing axillary lymph nodes.   08/06/2016 Receptors her2   ER 100%+, PR 90%+, HER2-, Ki67 10%   08/06/2016 Initial Biopsy   Right breast 12:30 biopsy showed invasive lobular carcinoma and LCIS   08/19/2016 Imaging   breast MRI showed a 7 x 6 x 7 mm enhancing mass in the posterior third of the upper inner quadrant of the right breast.   08/24/2016 Initial Diagnosis   Malignant neoplasm  of upper-outer quadrant of right breast in female, estrogen receptor positive ([place])   08/27/2016 Surgery   Right lumpectomy and SLN biopsy, Dr. Brantley Stage    08/27/2016 Pathology Results   Right lumpectomy showed ILC and LCIS, G1, final margins are negative, all 6 nodes are negative    10/06/2016 -  11/24/2016 Radiation Therapy   With Dr.Moody. 1. The Right breast was treated to 50.4 Gy in 28 fractions at 1.8 Gy per fraction. 2. The Right breast was boosted to 10 Gy in 5 fractions at 2 Gy per fraction.    12/2016 -  Anti-estrogen oral therapy   Tamoxifen daily   08/06/2017 Mammogram   IMPRESSION: 1. No evidence of recurrent or new breast malignancy. 2. Benign postsurgical changes on the right.     08/09/2018 Mammogram   IMPRESSION: Stable right breast lumpectomy site. No mammographic evidence of malignancy in the bilateral breasts.   12/30/2018 Breast US   LEFT BREAST US FINDINGS: On physical exam,there is a small nodule near the chest in the deep left axilla. Ultrasound is performed, showing normal sized, normal morphology lymph nodes in the left axilla. There are no enlarged or abnormal lymph nodes. There are no axillary masses. IMPRESSION: Negative exam.  No abnormal left axillary lymph nodes.        INTERVAL HISTORY:  [name] is here for a follow up of breast cancer. She was last seen by me on 07/26/20. She presents to the clinic alone. She reports she is still sore from her breast biopsy. She notes the process of MRI biopsy is very different from US biopsy. She notes she sees her GYN annually in February.   All other systems were reviewed with the patient and are negative.  MEDICAL HISTORY:  Past Medical History:  Diagnosis Date   Allergy    Breast cancer ([place]) 08/2016   right   Chicken pox    Dental crown present    HIV infection ([place])    TESTING FOR HIV   Personal history of radiation therapy    May-July 2018    Urinary tract infection     SURGICAL HISTORY: Past Surgical History:  Procedure Laterality Date   BREAST LUMPECTOMY Right 2018   BREAST LUMPECTOMY WITH RADIOACTIVE SEED AND SENTINEL LYMPH NODE BIOPSY Right 08/27/2016   Procedure: RIGHT BREAST LUMPECTOMY WITH RADIOACTIVE SEED AND SENTINEL LYMPH NODE BIOPSY;  Surgeon: Erroll Luna,  MD;  Location: [facility];  Service: General;  Laterality: Right;   BREAST SURGERY     biopsy   DILATION AND EVACUATION  04/08/2008; 08/01/2008    I have reviewed the social history and family history with the patient and they are unchanged from previous note.  ALLERGIES:  is allergic to erythromycin.  MEDICATIONS:  Current Outpatient Medications  Medication Sig Dispense Refill   Calcium Carb-Cholecalciferol (CALCIUM 500 + D3 PO)      Collagen Hydrolysate, Bovine, POWD      Cyanocobalamin (B-12 PO) Take 1 tablet by mouth daily.      diphenhydrAMINE HCl, Sleep, (ZZZQUIL) 25 MG CAPS      fexofenadine (ALLEGRA) 180 MG tablet Take 180 mg by mouth daily as needed for allergies.      hydrocortisone (ANUSOL-HC) 25 MG suppository Place 25 mg rectally daily as needed.     Melatonin 10 MG TABS Take 1 tablet by mouth at bedtime.      Multiple Vitamin (MULTIVITAMIN) tablet Take 1 tablet by mouth daily.     Omega  3 1200 MG CAPS      Psyllium (METAMUCIL FIBER PO) Take 2 capsules by mouth daily.      tamoxifen (NOLVADEX) 20 MG tablet TAKE 1 TABLET BY MOUTH EVERY DAY 90 tablet 3   vitamin C (ASCORBIC ACID) 500 MG tablet Take 500 mg by mouth daily.     No current facility-administered medications for this visit.    PHYSICAL EXAMINATION: ECOG PERFORMANCE STATUS: 0 - Asymptomatic  Vitals:   03/07/21 0835  BP: (!) 139/100  Pulse: 65  Resp: 17  Temp: 98.5 F (36.9 C)  SpO2: 100%   Wt Readings from Last 3 Encounters:  03/07/21 137 lb 8 oz (62.4 kg)  07/26/20 142 lb 11.2 oz (64.7 kg)  01/26/20 140 lb 4.8 oz (63.6 kg)     GENERAL:alert, no distress and comfortable SKIN: skin color, texture, turgor are normal, no rashes or significant lesions EYES: normal, Conjunctiva are pink and non-injected, sclera clear  NECK: supple, thyroid normal size, non-tender, without nodularity LYMPH:  no palpable lymphadenopathy in the cervical, axillary  LUNGS: clear to auscultation and  percussion with normal breathing effort HEART: regular rate & rhythm and no murmurs and no lower extremity edema ABDOMEN:abdomen soft, non-tender and normal bowel sounds Musculoskeletal:no cyanosis of digits and no clubbing  NEURO: alert & oriented x 3 with fluent speech, no focal motor/sensory deficits BREAST: deferred given recent MRI and biopsy.  LABORATORY DATA:  I have reviewed the data as listed CBC Latest Ref Rng & Units 03/07/2021 07/26/2020 01/26/2020  WBC 4.0 - 10.5 K/uL 6.8 6.7 5.5  Hemoglobin 12.0 - 15.0 g/dL 13.2 13.6 14.2  Hematocrit 36.0 - 46.0 % 38.1 38.9 41.4  Platelets 150 - 400 K/uL 240 214 202     CMP Latest Ref Rng & Units 03/07/2021 07/26/2020 01/26/2020  Glucose 70 - 99 mg/dL 89 73 77  BUN 6 - 20 mg/dL 16 19 18   Creatinine 0.44 - 1.00 mg/dL 1.00 0.93 0.90  Sodium 135 - 145 mmol/L 138 139 140  Potassium 3.5 - 5.1 mmol/L 4.3 4.0 4.1  Chloride 98 - 111 mmol/L 103 104 108  CO2 22 - 32 mmol/L 27 25 27   Calcium 8.9 - 10.3 mg/dL 9.6 9.1 9.4  Total Protein 6.5 - 8.1 g/dL 7.2 7.1 6.9  Total Bilirubin 0.3 - 1.2 mg/dL 1.1 0.6 0.6  Alkaline Phos 38 - 126 U/L 45 56 45  AST 15 - 41 U/L 23 19 23   ALT 0 - 44 U/L 19 13 26       RADIOGRAPHIC STUDIES: I have personally reviewed the radiological images as listed and agreed with the findings in the report. No results found.    Orders Placed This Encounter  Procedures   MM DIAG BREAST TOMO BILATERAL    Standing Status:   Future    Standing Expiration Date:   03/07/2022    Order Specific Question:   Reason for Exam (SYMPTOM  OR DIAGNOSIS REQUIRED)    Answer:   screening    Order Specific Question:   Preferred imaging location?    Answer:   [facility]    Order Specific Question:   Release to patient    Answer:   Immediate    Order Specific Question:   Is the patient pregnant?    Answer:   No   MR BREAST BILATERAL W WO CONTRAST INC CAD    Standing Status:   Future    Standing Expiration Date:   03/07/2022  Order Specific Question:   If indicated for the ordered procedure, I authorize the administration of contrast media per Radiology protocol    Answer:   Yes    Order Specific Question:   What is the patient's sedation requirement?    Answer:   No Sedation    Order Specific Question:   Does the patient have a pacemaker or implanted devices?    Answer:   No    Order Specific Question:   Radiology Contrast Protocol - do NOT remove file path    Answer:   [email or web address]    Order Specific Question:   Preferred imaging location?    Answer:   [facility] (table limit-550lbs)   All questions were answered. The patient knows to call the clinic with any problems, questions or concerns. No barriers to learning was detected. The total time spent in the appointment was 30 minutes.     Truitt Merle, MD 03/07/2021   I, Wilburn Mylar, am acting as scribe for Truitt Merle, MD.   I have reviewed the above documentation for accuracy and completeness, and I agree with the above.

## 2021-04-16 ENCOUNTER — Other Ambulatory Visit: Payer: Self-pay | Admitting: Hematology

## 2021-04-16 DIAGNOSIS — C50911 Malignant neoplasm of unspecified site of right female breast: Secondary | ICD-10-CM

## 2021-07-23 ENCOUNTER — Encounter: Payer: Self-pay | Admitting: Hematology

## 2021-08-04 ENCOUNTER — Other Ambulatory Visit: Payer: Self-pay

## 2021-08-04 ENCOUNTER — Ambulatory Visit
Admission: RE | Admit: 2021-08-04 | Discharge: 2021-08-04 | Disposition: A | Discharge status: Home / Self Care | Payer: 59 | Source: Ambulatory Visit | Attending: Hematology | Admitting: Hematology

## 2021-08-04 DIAGNOSIS — C50411 Malignant neoplasm of upper-outer quadrant of right female breast: Secondary | ICD-10-CM

## 2021-08-04 IMAGING — MR MR BREAST BILAT WO/W CM
8 of 12 series · 33 of 48 positions shown · IV contrast (6 ml gadavist)
Comparison: Prior MRs, mammograms and ultrasounds.

CLINICAL DATA: 50-year-old female for six-month follow-up of benign
LEFT breast biopsy. Patient with history of RIGHT breast cancer and
lumpectomy in [2K].

EXAM:
BILATERAL BREAST MRI WITH AND WITHOUT CONTRAST
TECHNIQUE: Multiplanar, multisequence MR images of both breasts were obtained
prior to and following the intravenous administration of 6 ml of
Gadavist

[Series 2: t2_tirm_tra ipat (a-p) · axial · 3.0mm · 0.64mm/px · 1 of 60 slices shown]
[im 1/60]
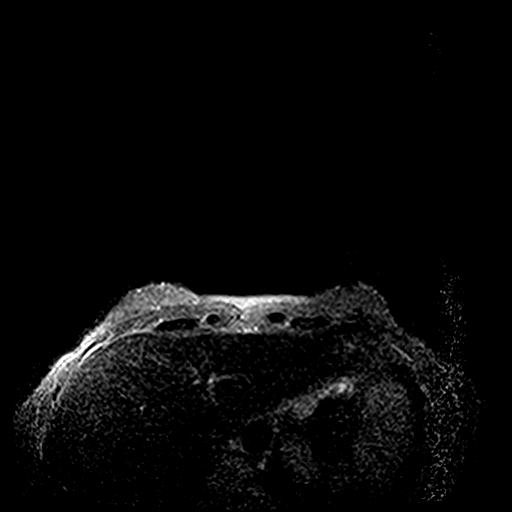

[Series 3: fl3d pre-cm non · axial · non-contrast · 1.2mm · 0.89mm/px · z∈[-104,+67]mm · 5 of 144 slices shown]
[im 1/144]
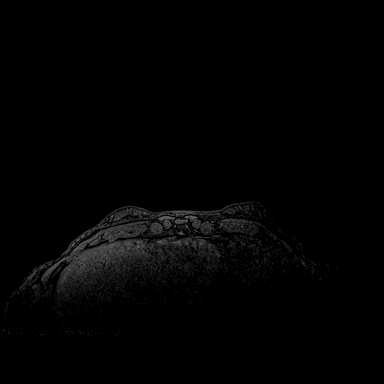
[im 36/144]
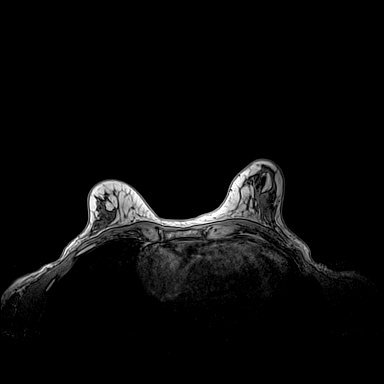
[im 72/144]
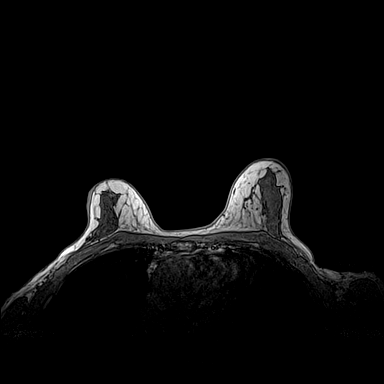
[im 108/144]
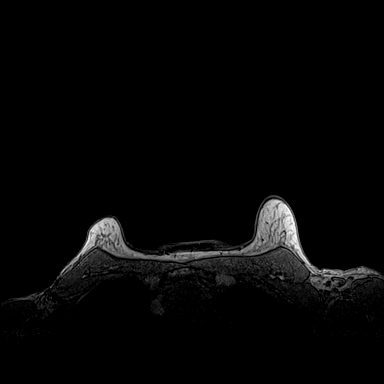
[im 144/144]
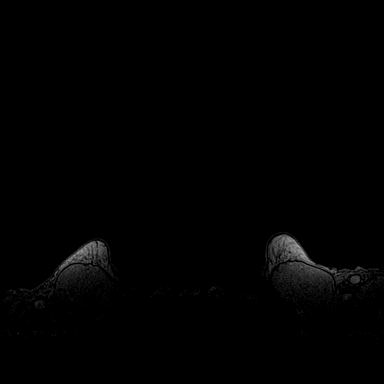

[Series 4: fl3d pre-cm · axial · non-contrast · 1.2mm · 0.89mm/px · z∈[-104,+67]mm · 5 of 144 slices shown]
[im 1/144]
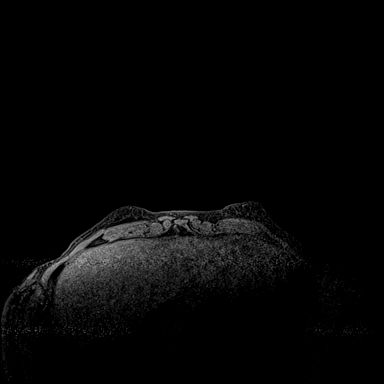
[im 36/144]
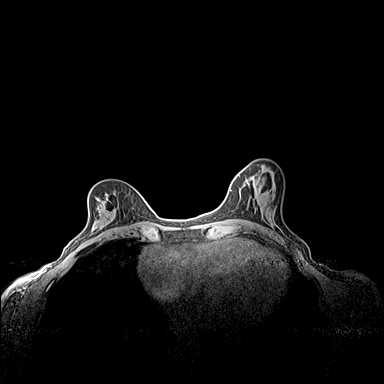
[im 72/144]
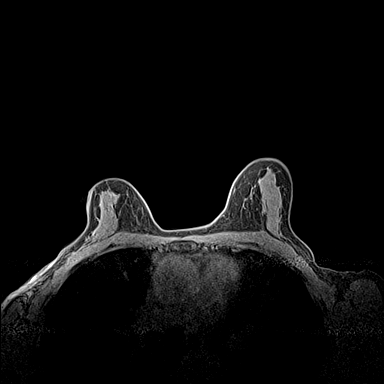
[im 108/144]
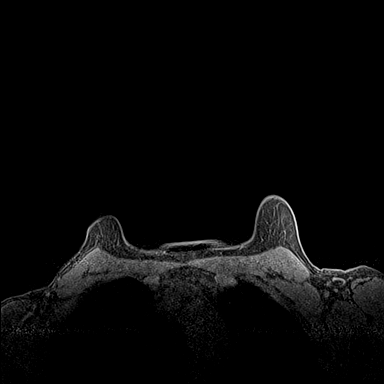
[im 144/144]
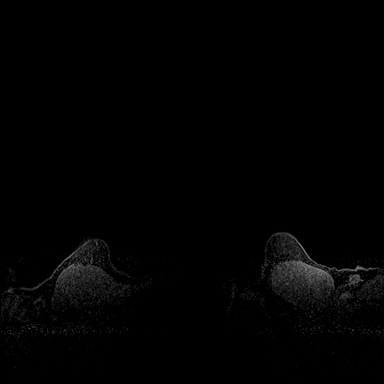

[Series 5: fl3d pre-cm 20 · axial · non-contrast · 1.2mm · 0.89mm/px · z∈[-104,+67]mm · 5 of 144 slices shown (1 of 3)]
[im 1/144]
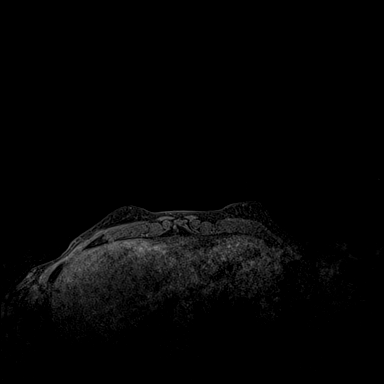
[im 36/144]
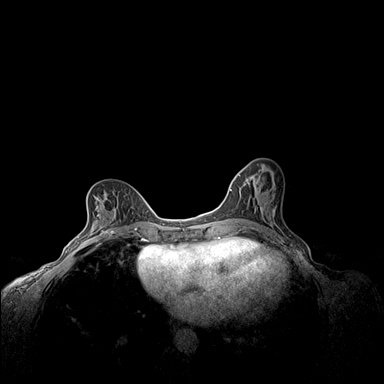
[im 72/144]
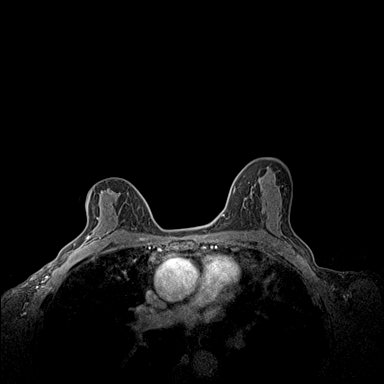
[im 108/144]
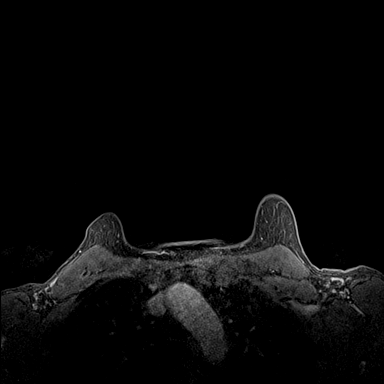
[im 144/144]
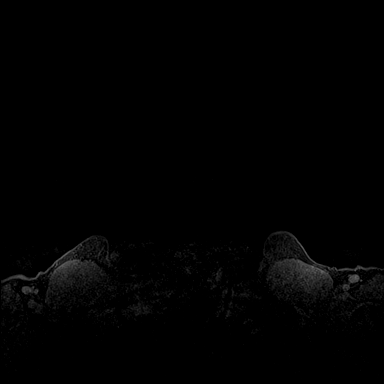

[Series 6: fl3d pre-cm 20 · axial · non-contrast · 1.2mm · 0.89mm/px · z∈[-104,+67]mm · 5 of 144 slices shown (2 of 3)]
[im 1/144]
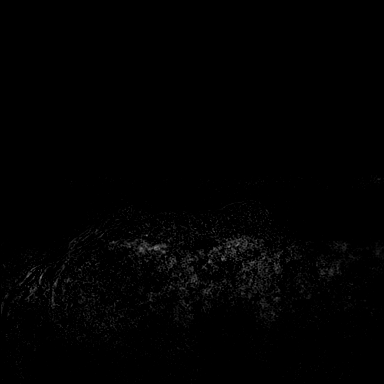
[im 36/144]
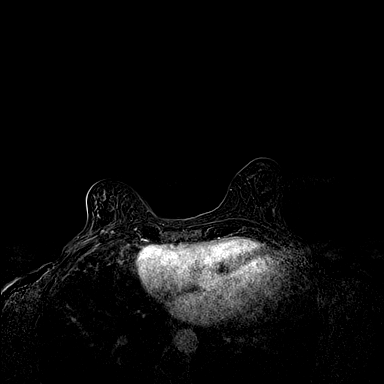
[im 72/144]
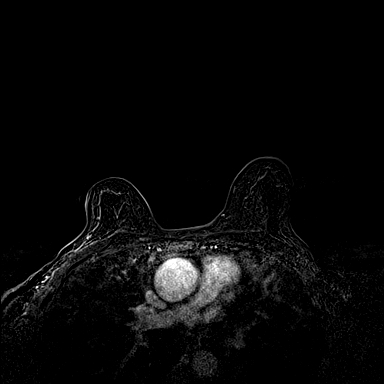
[im 108/144]
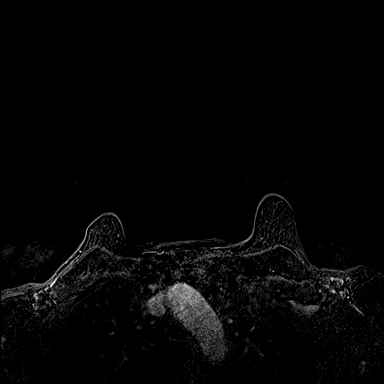
[im 144/144]
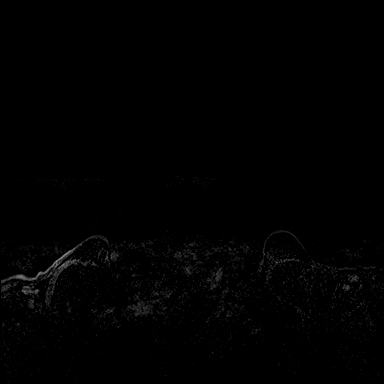

[Series 7: fl3d pre-cm 20 · axial · non-contrast · 172.8mm · 0.89mm/px · 1 of 1 slices shown (3 of 3)]
[im 1/1]
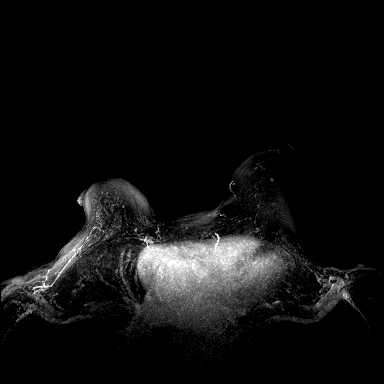

[Series 8: fl3d pre-cm 3min · axial · non-contrast · 1.2mm · 0.89mm/px · z∈[-104,+67]mm · 6 of 144 slices shown]
[im 1/144]
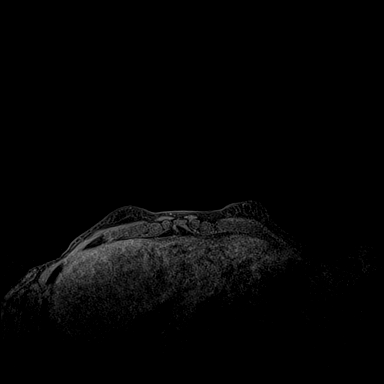
[im 29/144]
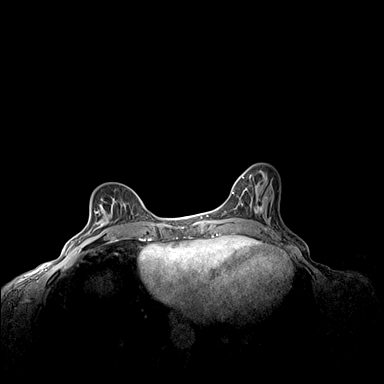
[im 58/144]
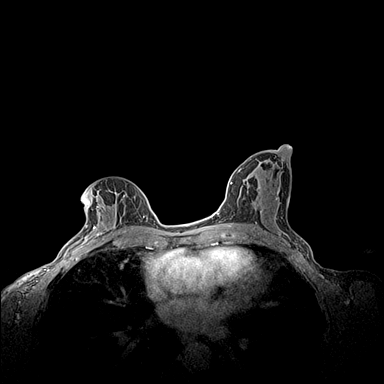
[im 86/144]
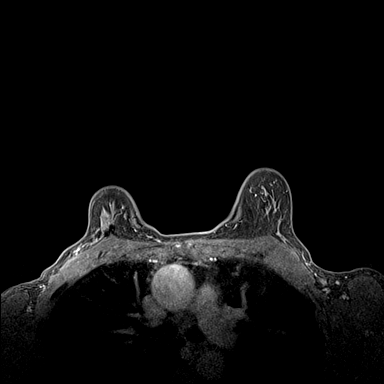
[im 115/144]
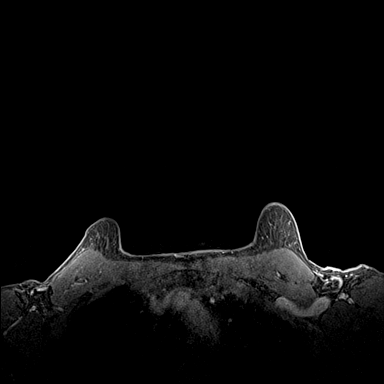
[im 144/144]
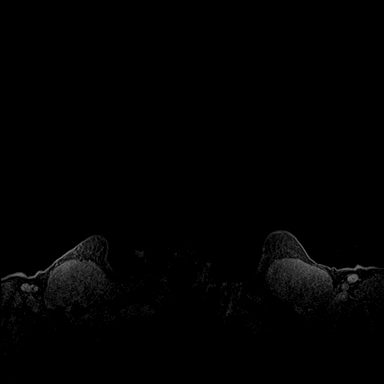

[Series 9: fl3d pre-cm 3min_sub · axial · non-contrast · 1.2mm · 0.89mm/px · z∈[-104,+32]mm · 5 of 144 slices shown]
[im 1/144]
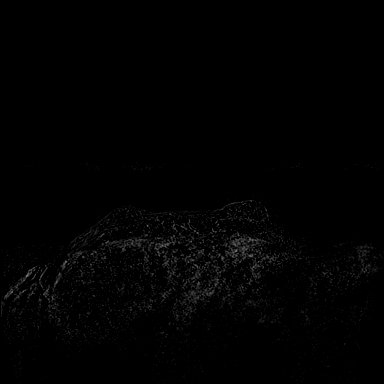
[im 29/144]
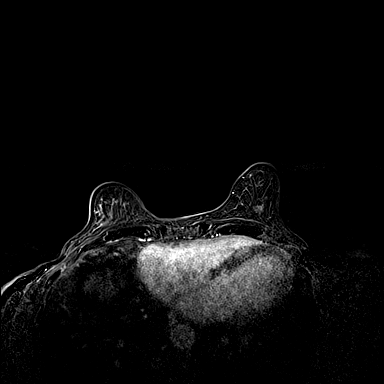
[im 58/144]
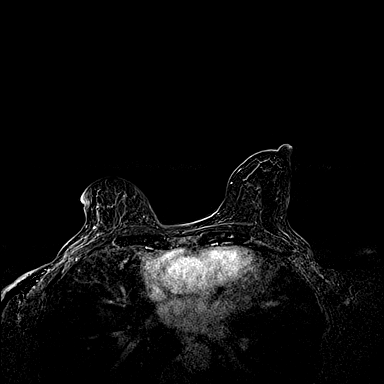
[im 86/144]
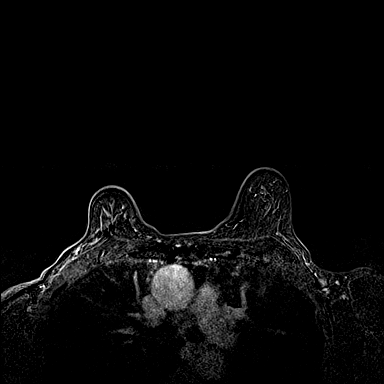
[im 115/144]
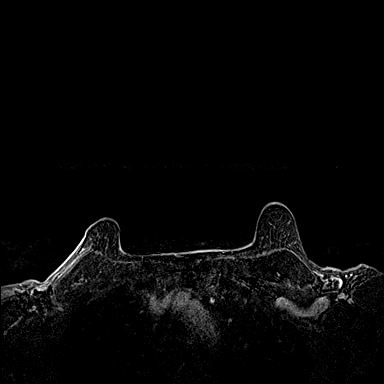

[33 of 48 positions shown; findings below may reference images not displayed]

Three-dimensional MR images were rendered by post-processing of the
original MR data on an independent workstation. The
three-dimensional MR images were interpreted, and findings are
reported in the following complete MRI report for this study. Three
dimensional images were evaluated at the independent interpreting
workstation using the DynaCAD thin client.
FINDINGS: Breast composition: c. Heterogeneous fibroglandular tissue.

Background parenchymal enhancement: Mild

Right breast: No mass or abnormal enhancement. RIGHT lumpectomy
changes are again noted.

Left breast: Biopsy clip artifact within the LOWER LEFT breast
identified. No suspicious mass or worrisome enhancement is
identified.

Lymph nodes: No abnormal appearing lymph nodes.

Ancillary findings:  None.
IMPRESSION: 1. No suspicious MR findings within either breast.
2. RIGHT lumpectomy changes and LOWER LEFT breast biopsy clip
artifact.

RECOMMENDATION:
Annual screening breast MRI in 6 months in this high risk patient.

Bilateral mammograms in 1-2 months to resume annual mammogram
schedule.

BI-RADS CATEGORY  2: Benign.

## 2021-08-04 MED ORDER — GADOBUTROL 1 MMOL/ML IV SOLN
6.0000 mL | Freq: Once | INTRAVENOUS | Status: AC | PRN
  Administered 2021-08-04: 6 mL via INTRAVENOUS

## 2021-08-27 ENCOUNTER — Ambulatory Visit: Payer: 59

## 2021-11-13 ENCOUNTER — Other Ambulatory Visit: Payer: Self-pay

## 2021-11-13 DIAGNOSIS — C50411 Malignant neoplasm of upper-outer quadrant of right female breast: Secondary | ICD-10-CM

## 2021-11-14 ENCOUNTER — Inpatient Hospital Stay (HOSPITAL_BASED_OUTPATIENT_CLINIC_OR_DEPARTMENT_OTHER): Payer: 59 | Admitting: Hematology

## 2021-11-14 ENCOUNTER — Other Ambulatory Visit: Payer: Self-pay

## 2021-11-14 ENCOUNTER — Encounter: Payer: Self-pay | Admitting: Hematology

## 2021-11-14 ENCOUNTER — Inpatient Hospital Stay: Payer: 59 | Attending: Hematology

## 2021-11-14 VITALS — BP 115/65 | HR 59 | Temp 98.6°F | Resp 16 | Wt 141.7 lb

## 2021-11-14 DIAGNOSIS — Z853 Personal history of malignant neoplasm of breast: Secondary | ICD-10-CM | POA: Insufficient documentation

## 2021-11-14 DIAGNOSIS — G4700 Insomnia, unspecified: Secondary | ICD-10-CM | POA: Diagnosis not present

## 2021-11-14 DIAGNOSIS — C50411 Malignant neoplasm of upper-outer quadrant of right female breast: Secondary | ICD-10-CM | POA: Diagnosis not present

## 2021-11-14 DIAGNOSIS — R232 Flushing: Secondary | ICD-10-CM | POA: Insufficient documentation

## 2021-11-14 DIAGNOSIS — Z170 Estrogen receptor positive status [ER+]: Secondary | ICD-10-CM

## 2021-11-14 LAB — COMPREHENSIVE METABOLIC PANEL
ALT: 15 U/L (ref 0–44)
AST: 20 U/L (ref 15–41)
Albumin: 4.3 g/dL (ref 3.5–5.0)
Alkaline Phosphatase: 48 U/L (ref 38–126)
Anion gap: 4 — ABNORMAL LOW (ref 5–15)
BUN: 21 mg/dL — ABNORMAL HIGH (ref 6–20)
CO2: 28 mmol/L (ref 22–32)
Calcium: 9.6 mg/dL (ref 8.9–10.3)
Chloride: 105 mmol/L (ref 98–111)
Creatinine, Ser: 1.09 mg/dL — ABNORMAL HIGH (ref 0.44–1.00)
GFR, Estimated: >60 mL/min (ref >60)
Glucose, Bld: 88 mg/dL (ref 70–99)
Potassium: 5.2 mmol/L — ABNORMAL HIGH (ref 3.5–5.1)
Sodium: 137 mmol/L (ref 135–145)
Total Bilirubin: 0.7 mg/dL (ref 0.3–1.2)
Total Protein: 7 g/dL (ref 6.5–8.1)

## 2021-11-14 LAB — CBC WITH DIFFERENTIAL/PLATELET
Abs Immature Granulocytes: 0.01 10*3/uL (ref 0.00–0.07)
Basophils Absolute: 0.1 10*3/uL (ref 0.0–0.1)
Basophils Relative: 1 %
Eosinophils Absolute: 0.1 10*3/uL (ref 0.0–0.5)
Eosinophils Relative: 2 %
HCT: 40.1 % (ref 36.0–46.0)
Hemoglobin: 14 g/dL (ref 12.0–15.0)
Immature Granulocytes: 0 %
Lymphocytes Relative: 36 %
Lymphs Abs: 2.1 10*3/uL (ref 0.7–4.0)
MCH: 32.7 pg (ref 26.0–34.0)
MCHC: 34.9 g/dL (ref 30.0–36.0)
MCV: 93.7 fL (ref 80.0–100.0)
Monocytes Absolute: 0.5 10*3/uL (ref 0.1–1.0)
Monocytes Relative: 9 %
Neutro Abs: 3.1 10*3/uL (ref 1.7–7.7)
Neutrophils Relative %: 52 %
Platelets: 214 10*3/uL (ref 150–400)
RBC: 4.28 MIL/uL (ref 3.87–5.11)
RDW: 12.2 % (ref 11.5–15.5)
WBC: 5.9 10*3/uL (ref 4.0–10.5)
nRBC: 0 % (ref 0.0–0.2)

## 2021-11-14 NOTE — Progress Notes (Addendum)
[name]   [phone] [phone]   Clinic Follow up Note   Patient Care Team: [name], Betty Pope, Pope as PCP - General (Family Medicine) Janice Luna, Pope as Consulting Physician (General Surgery) Janice Rudd, Pope as Consulting Physician (Radiation Oncology) Janice Merle, Pope as Consulting Physician (Hematology) [name], Janice Massed, NP as Nurse Practitioner (Hematology and Oncology) Janice Pearson, Pope as Consulting Physician (Obstetrics and Gynecology)  Date of Service:  11/14/2021  CHIEF COMPLAINT: f/u of right breast cancer  CURRENT THERAPY:  Tamoxifen 21m daily started in 12/07/16  ASSESSMENT & PLAN:  Janice Gilmeris a 51y.o. peri-menopausal female with   1. Malignant [place] of upper-outer quadrant of right breast, stage IA (pT1bN0M0), Invasive Lobular Carcinoma, ER/PR Positive, Her2 Negative, Grade I  -diagnosed in 07/2016. S/p right lumpectomy with SLNB and adjuvant radiation.  -She started Tamoxifen in 11/2016, experiencing manageable hot flashes. Plan for 10 years. -Last period 05/2018. Her 01/2020 FSH was still pre-menopausal.  -genetic testing with her GYN was negative. -short-term f/u breast MRI on 08/04/21 was benign. She is scheduled for mammogram on 11/26/21. -She is clinically doing well. Lab reviewed, WNL. Physical exam was unremarkable. There is no clinical concern for recurrence. -she is now over 5 years out from diagnosis. I reviewed that her risk of recurrence is significantly decreased. -Continue Tamoxifen, for a total of 10 years   -F/u in 1 year   2. Bone Health  -May consider AI if she is postmenopausal. Will obtain baseline DEXA when she becomes post-menopausal or before switching to AI.     3. 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 She is no longer on Xanax, manageable on Zquil.  -hot flashes are manageable      PLAN: -Continue tamoxifen -Mammogram 11/26/21 and MRI in  07/2022 -Lab and F/u in 1 year   No problem-specific Assessment & Plan notes found for this encounter.   SUMMARY OF ONCOLOGIC HISTORY: Oncology History Overview Note  Cancer Staging Malignant neoplasm of upper-outer quadrant of right breast in female, estrogen receptor positive ([place] Staging form: Breast, AJCC 8th Edition - Pathologic stage from 08/27/2016: Stage IA (pT1a(m), pN0, cM0, G1, ER: Positive, PR: Positive, HER2: Negative) - Signed by [name] on 09/09/2016 - Pathologic: No stage assigned - Unsigned     Malignant neoplasm of upper-outer quadrant of right breast in female, estrogen receptor positive ([facility]  08/05/2016 Mammogram   Targeted ultrasound of the right breast was performed demonstrating an irregular hypoechoic mass at 12:30, 5 cm from the nipple measuring 6 x 4 x 7 mm which is thought to correspond to the area of distortion seen mammographically. Targeted ultrasound of the right axilla demonstrates no suspicious appearing axillary lymph nodes.   08/06/2016 Receptors her2   ER 100%+, PR 90%+, HER2-, Ki67 10%   08/06/2016 Initial Biopsy   Right breast 12:30 biopsy showed invasive lobular carcinoma and LCIS   08/19/2016 Imaging   breast MRI showed a 7 x 6 x 7 mm enhancing mass in the posterior third of the upper inner quadrant of the right breast.   08/24/2016 Initial Diagnosis   Malignant neoplasm of upper-outer quadrant of right breast in female, estrogen receptor positive ([facility]   08/27/2016 Surgery   Right lumpectomy and SLN biopsy, Dr. [facility] Stage   08/27/2016 Pathology Results   Right lumpectomy showed IMira Monteand LCIS, G1, final margins are negative, all 6 nodes are negative    10/06/2016 - 11/24/2016  Radiation Therapy   With Dr.Moody. 1. The Right breast was treated to 50.4 Gy in 28 fractions at 1.8 Gy per fraction. 2. The Right breast was boosted to 10 Gy in 5 fractions at 2 Gy per fraction.    12/2016 -  Anti-estrogen oral therapy   Tamoxifen daily   08/06/2017  Mammogram   IMPRESSION: 1. No evidence of recurrent or new breast malignancy. 2. Benign postsurgical changes on the right.     08/09/2018 Mammogram   IMPRESSION: Stable right breast lumpectomy site. No mammographic evidence of malignancy in the bilateral breasts.   12/30/2018 Breast US   LEFT BREAST US FINDINGS: On physical exam,there is a small nodule near the chest in the deep left axilla. Ultrasound is performed, showing normal sized, normal morphology lymph nodes in the left axilla. There are no enlarged or abnormal lymph nodes. There are no axillary masses. IMPRESSION: Negative exam.  No abnormal left axillary lymph nodes.        INTERVAL HISTORY:  [name] is here for a follow up of breast cancer. She was last seen by me on 03/07/21. She presents to the clinic alone. She reports she is doing well overall, no new concerns. She reports continued hot flashes from tamoxifen, no new or worsening side effects.   All other systems were reviewed with the patient and are negative.  MEDICAL HISTORY:  Past Medical History:  Diagnosis Date   Allergy    Breast cancer ([place]) 08/2016   right   Chicken pox    Dental crown present    HIV infection ([place])    TESTING FOR HIV   Personal history of radiation therapy    May-July 2018    Urinary tract infection     SURGICAL HISTORY: Past Surgical History:  Procedure Laterality Date   BREAST LUMPECTOMY Right 2018   BREAST LUMPECTOMY WITH RADIOACTIVE SEED AND SENTINEL LYMPH NODE BIOPSY Right 08/27/2016   Procedure: RIGHT BREAST LUMPECTOMY WITH RADIOACTIVE SEED AND SENTINEL LYMPH NODE BIOPSY;  Surgeon: Janice Luna, Pope;  Location: [facility];  Service: General;  Laterality: Right;   BREAST SURGERY     biopsy   DILATION AND EVACUATION  04/08/2008; 08/01/2008    I have reviewed the social history and family history with the patient and they are unchanged from previous note.  ALLERGIES:  is allergic to  erythromycin.  MEDICATIONS:  Current Outpatient Medications  Medication Sig Dispense Refill   Calcium Carb-Cholecalciferol (CALCIUM 500 + D3 PO)      Collagen Hydrolysate, Bovine, POWD      Cyanocobalamin (B-12 PO) Take 1 tablet by mouth daily.      diphenhydrAMINE HCl, Sleep, (ZZZQUIL) 25 MG CAPS      fexofenadine (ALLEGRA) 180 MG tablet Take 180 mg by mouth daily as needed for allergies.      hydrocortisone (ANUSOL-HC) 25 MG suppository Place 25 mg rectally daily as needed.     Melatonin 10 MG TABS Take 1 tablet by mouth at bedtime.      Multiple Vitamin (MULTIVITAMIN) tablet Take 1 tablet by mouth daily.     Omega 3 1200 MG CAPS      Psyllium (METAMUCIL FIBER PO) Take 2 capsules by mouth daily.      tamoxifen (NOLVADEX) 20 MG tablet TAKE 1 TABLET BY MOUTH EVERY DAY 30 tablet 11   vitamin C (ASCORBIC ACID) 500 MG tablet Take 500 mg by mouth daily.     No current facility-administered medications for this  visit.    PHYSICAL EXAMINATION: ECOG PERFORMANCE STATUS: 0 - Asymptomatic  Vitals:   11/14/21 1044  BP: 115/65  Pulse: (!) 59  Resp: 16  Temp: 98.6 F (37 C)  SpO2: 97%   Wt Readings from Last 3 Encounters:  11/14/21 141 lb 11.2 oz (64.3 kg)  03/07/21 137 lb 8 oz (62.4 kg)  07/26/20 142 lb 11.2 oz (64.7 kg)     GENERAL:alert, no distress and comfortable SKIN: skin color, texture, turgor are normal, no rashes or significant lesions EYES: normal, Conjunctiva are pink and non-injected, sclera clear  NECK: supple, thyroid normal size, non-tender, without nodularity LYMPH:  no palpable lymphadenopathy in the cervical, axillary LUNGS: clear to auscultation and percussion with normal breathing effort HEART: regular rate & rhythm and no murmurs and no lower extremity edema ABDOMEN:abdomen soft, non-tender and normal bowel sounds Musculoskeletal:no cyanosis of digits and no clubbing  NEURO: alert & oriented x 3 with fluent speech, no focal motor/sensory deficits BREAST: No  palpable mass, nodules or adenopathy bilaterally. Breast exam benign.   LABORATORY DATA:  I have reviewed the data as listed    Latest Ref Rng & Units 11/14/2021   10:32 AM 03/07/2021    8:09 AM 07/26/2020    2:37 PM  CBC  WBC 4.0 - 10.5 K/uL 5.9  6.8  6.7   Hemoglobin 12.0 - 15.0 Pope/dL 14.0  13.2  13.6   Hematocrit 36.0 - 46.0 % 40.1  38.1  38.9   Platelets 150 - 400 K/uL 214  240  214         Latest Ref Rng & Units 11/14/2021   10:32 AM 03/07/2021    8:09 AM 07/26/2020    2:37 PM  CMP  Glucose 70 - 99 mg/dL 88  89  73   BUN 6 - 20 mg/dL _0 Creatinine 0.44 - 1.00 mg/dL 1.09  1.00  0.93   Sodium 135 - 145 mmol/L 137  138  139   Potassium 3.5 - 5.1 mmol/L 5.2  4.3  4.0   Chloride 98 - 111 mmol/L 105  103  104   CO2 22 - 32 mmol/L _1 Calcium 8.9 - 10.3 mg/dL 9.6  9.6  9.1   Total Protein 6.5 - 8.1 Pope/dL 7.0  7.2  7.1   Total Bilirubin 0.3 - 1.2 mg/dL 0.7  1.1  0.6   Alkaline Phos 38 - 126 U/L 48  45  56   AST 15 - 41 U/L _2 ALT 0 - 44 U/L _3 RADIOGRAPHIC STUDIES: I have personally reviewed the radiological images as listed and agreed with the findings in the report. No results found.    Orders Placed This Encounter  Procedures   MR BREAST BILATERAL W WO CONTRAST INC CAD    Standing Status:   Future    Standing Expiration Date:   11/15/2022    Order Specific Question:   If indicated for the ordered procedure, I authorize the administration of contrast media per Radiology protocol    Answer:   Yes    Order Specific Question:   What is the patient's sedation requirement?    Answer:   No Sedation    Order Specific Question:   Does the patient have a pacemaker or implanted devices?    Answer:   No  Order Specific Question:   Radiology Contrast Protocol - do NOT remove file path    Answer:   [email or web address]    Order Specific Question:   Preferred imaging location?    Answer:   [facility] (table limit-550lbs)   All questions were answered. The patient knows to call the clinic with any problems, questions or concerns. No barriers to learning was detected.      Janice Merle, Pope 11/14/2021   I, Wilburn Mylar, am acting as scribe for Janice Merle, Pope.   I have reviewed the above documentation for accuracy and completeness, and I agree with the above.

## 2021-11-26 ENCOUNTER — Ambulatory Visit
Admission: RE | Admit: 2021-11-26 | Discharge: 2021-11-26 | Disposition: A | Discharge status: Home / Self Care | Payer: 59 | Source: Ambulatory Visit | Attending: Hematology | Admitting: Hematology

## 2021-11-26 DIAGNOSIS — C50411 Malignant neoplasm of upper-outer quadrant of right female breast: Secondary | ICD-10-CM

## 2022-04-24 ENCOUNTER — Telehealth: Payer: 59 | Admitting: Physician Assistant

## 2022-04-24 DIAGNOSIS — B9689 Other specified bacterial agents as the cause of diseases classified elsewhere: Secondary | ICD-10-CM

## 2022-04-24 DIAGNOSIS — J019 Acute sinusitis, unspecified: Secondary | ICD-10-CM

## 2022-04-24 MED ORDER — FLUTICASONE PROPIONATE 50 MCG/ACT NA SUSP: 2.0000 | Freq: Every day | NASAL | 0 refills | Status: DC

## 2022-04-24 MED ORDER — DOXYCYCLINE HYCLATE 100 MG PO TABS: 100.0000 mg | ORAL_TABLET | Freq: Two times a day (BID) | ORAL | 0 refills | Status: DC

## 2022-04-24 MED ORDER — BENZONATATE 100 MG PO CAPS: 100.0000 mg | ORAL_CAPSULE | Freq: Three times a day (TID) | ORAL | 0 refills | Status: DC | PRN

## 2022-04-24 NOTE — Patient Instructions (Signed)
Marcha Dutton, thank you for joining Leeanne Rio, PA-C for today's virtual visit.  While this provider is not your primary care provider (PCP), if your PCP is located in our provider database this encounter information will be shared with them immediately following your visit.   [facility] account gives you access to today's visit and all your visits, tests, and labs performed at [facility] " click here if you don't have a [facility] account or go to [email or web address]  Consent: (Patient) [name] provided verbal consent for this virtual visit at the beginning of the encounter.  Current Medications:  Current Outpatient Medications:    benzonatate (TESSALON) 100 MG capsule, Take 1 capsule (100 mg total) by mouth 3 (three) times daily as needed for cough., Disp: 30 capsule, Rfl: 0   doxycycline (VIBRA-TABS) 100 MG tablet, Take 1 tablet (100 mg total) by mouth 2 (two) times daily., Disp: 20 tablet, Rfl: 0   fluticasone (FLONASE) 50 MCG/ACT nasal spray, Place 2 sprays into both nostrils daily., Disp: 16 g, Rfl: 0   Calcium Carb-Cholecalciferol (CALCIUM 500 + D3 PO), , Disp: , Rfl:    Collagen Hydrolysate, Bovine, POWD, , Disp: , Rfl:    Cyanocobalamin (B-12 PO), Take 1 tablet by mouth daily. , Disp: , Rfl:    diphenhydrAMINE HCl, Sleep, (ZZZQUIL) 25 MG CAPS, , Disp: , Rfl:    fexofenadine (ALLEGRA) 180 MG tablet, Take 180 mg by mouth daily as needed for allergies. , Disp: , Rfl:    hydrocortisone (ANUSOL-HC) 25 MG suppository, Place 25 mg rectally daily as needed., Disp: , Rfl:    Melatonin 10 MG TABS, Take 1 tablet by mouth at bedtime. , Disp: , Rfl:    Multiple Vitamin (MULTIVITAMIN) tablet, Take 1 tablet by mouth daily., Disp: , Rfl:    Omega 3 1200 MG CAPS, , Disp: , Rfl:    Psyllium (METAMUCIL FIBER PO), Take 2 capsules by mouth daily. , Disp: , Rfl:    tamoxifen (NOLVADEX) 20 MG tablet, TAKE 1 TABLET BY MOUTH EVERY DAY,  Disp: 30 tablet, Rfl: 11   vitamin C (ASCORBIC ACID) 500 MG tablet, Take 500 mg by mouth daily., Disp: , Rfl:    Medications ordered in this encounter:  Meds ordered this encounter  Medications   fluticasone (FLONASE) 50 MCG/ACT nasal spray    Sig: Place 2 sprays into both nostrils daily.    Dispense:  16 g    Refill:  0    Order Specific Question:   Supervising Provider    Answer:   Chase Picket [id number]   doxycycline (VIBRA-TABS) 100 MG tablet    Sig: Take 1 tablet (100 mg total) by mouth 2 (two) times daily.    Dispense:  20 tablet    Refill:  0    Order Specific Question:   Supervising Provider    Answer:   Chase Picket [[id number]]   benzonatate (TESSALON) 100 MG capsule    Sig: Take 1 capsule (100 mg total) by mouth 3 (three) times daily as needed for cough.    Dispense:  30 capsule    Refill:  0    Order Specific Question:   Supervising Provider    Answer:   Chase Picket [id number]     *If you need refills on other medications prior to your next appointment, please contact your pharmacy*  Follow-Up: Call back or seek an in-person evaluation if the symptoms worsen or if  the condition fails to improve as anticipated.  [facility] [phone]  Other Instructions Please take antibiotic as directed.  Increase fluid intake.  Use Saline nasal spray.  Take a daily multivitamin. Use the Flonase once daily. The Tessalon is to help further with cough.  Place a humidifier in the bedroom.  Please call or return clinic if symptoms are not improving.  Sinusitis Sinusitis is redness, soreness, and swelling (inflammation) of the paranasal sinuses. Paranasal sinuses are air pockets within the bones of your face (beneath the eyes, the middle of the forehead, or above the eyes). In healthy paranasal sinuses, mucus is able to drain out, and air is able to circulate through them by way of your nose. However, when your paranasal sinuses are inflamed, mucus and air can  become trapped. This can allow bacteria and other germs to grow and cause infection. Sinusitis can develop quickly and last only a short time (acute) or continue over a long period (chronic). Sinusitis that lasts for more than 12 weeks is considered chronic.  CAUSES  Causes of sinusitis include: Allergies. Structural abnormalities, such as displacement of the cartilage that separates your nostrils (deviated septum), which can decrease the air flow through your nose and sinuses and affect sinus drainage. Functional abnormalities, such as when the small hairs (cilia) that line your sinuses and help remove mucus do not work properly or are not present. SYMPTOMS  Symptoms of acute and chronic sinusitis are the same. The primary symptoms are pain and pressure around the affected sinuses. Other symptoms include: Upper toothache. Earache. Headache. Bad breath. Decreased sense of smell and taste. A cough, which worsens when you are lying flat. Fatigue. Fever. Thick drainage from your nose, which often is green and may contain pus (purulent). Swelling and warmth over the affected sinuses. DIAGNOSIS  Your caregiver will perform a physical exam. During the exam, your caregiver may: Look in your nose for signs of abnormal growths in your nostrils (nasal polyps). Tap over the affected sinus to check for signs of infection. View the inside of your sinuses (endoscopy) with a special imaging device with a light attached (endoscope), which is inserted into your sinuses. If your caregiver suspects that you have chronic sinusitis, one or more of the following tests may be recommended: Allergy tests. Nasal culture A sample of mucus is taken from your nose and sent to a lab and screened for bacteria. Nasal cytology A sample of mucus is taken from your nose and examined by your caregiver to determine if your sinusitis is related to an allergy. TREATMENT  Most cases of acute sinusitis are related to a viral  infection and will resolve on their own within 10 days. Sometimes medicines are prescribed to help relieve symptoms (pain medicine, decongestants, nasal steroid sprays, or saline sprays).  However, for sinusitis related to a bacterial infection, your caregiver will prescribe antibiotic medicines. These are medicines that will help kill the bacteria causing the infection.  Rarely, sinusitis is caused by a fungal infection. In theses cases, your caregiver will prescribe antifungal medicine. For some cases of chronic sinusitis, surgery is needed. Generally, these are cases in which sinusitis recurs more than 3 times per year, despite other treatments. HOME CARE INSTRUCTIONS  Drink plenty of water. Water helps thin the mucus so your sinuses can drain more easily. Use a humidifier. Inhale steam 3 to 4 times a day (for example, sit in the bathroom with the shower running). Apply a warm, moist washcloth to  your face 3 to 4 times a day, or as directed by your caregiver. Use saline nasal sprays to help moisten and clean your sinuses. Take over-the-counter or prescription medicines for pain, discomfort, or fever only as directed by your caregiver. SEEK IMMEDIATE MEDICAL CARE IF: You have increasing pain or severe headaches. You have nausea, vomiting, or drowsiness. You have swelling around your face. You have vision problems. You have a stiff neck. You have difficulty breathing. MAKE SURE YOU:  Understand these instructions. Will watch your condition. Will get help right away if you are not doing well or get worse. Document Released: 05/04/2005 Document Revised: 07/27/2011 Document Reviewed: 05/19/2011 [facility] Patient Information 2014 [place], [place].    If you have been instructed to have an in-person evaluation today at a local Urgent Care facility, please use the link below. It will take you to a list of all of our available [facility], including address, phone number and hours of  operation. Please do not delay care.  [facility]  If you or a family member do not have a primary care provider, use the link below to schedule a visit and establish care. When you choose a [facility] primary care physician or advanced practice provider, you gain a long-term partner in health. Find a Primary Care Provider  Learn more about [facility]'s in-office and virtual care options: [facility]

## 2022-04-24 NOTE — Progress Notes (Signed)
Virtual Visit Consent   [name], you are scheduled for a virtual visit with a [facility] provider today. Just as with appointments in the office, your consent must be obtained to participate. Your consent will be active for this visit and any virtual visit you may have with one of our providers in the next 365 days. If you have a MyChart account, a copy of this consent can be sent to you electronically.  As this is a virtual visit, video technology does not allow for your provider to perform a traditional examination. This may limit your provider's ability to fully assess your condition. If your provider identifies any concerns that need to be evaluated in person or the need to arrange testing (such as labs, EKG, etc.), we will make arrangements to do so. Although advances in technology are sophisticated, we cannot ensure that it will always work on either your end or our end. If the connection with a video visit is poor, the visit may have to be switched to a telephone visit. With either a video or telephone visit, we are not always able to ensure that we have a secure connection.  By engaging in this virtual visit, you consent to the provision of healthcare and authorize for your insurance to be billed (if applicable) for the services provided during this visit. Depending on your insurance coverage, you may receive a charge related to this service.  I need to obtain your verbal consent now. Are you willing to proceed with your visit today? [name] has provided verbal consent on 04/24/2022 for a virtual visit (video or telephone). Leeanne Rio, [place]  Date: 04/24/2022 4:27 PM  Virtual Visit via Video Note   I, Leeanne Rio, connected with  [name]  ([id number], 04/02/1971) on 04/24/22 at  4:15 PM EST by a video-enabled telemedicine application and verified that I am speaking with the correct person using two identifiers.  Location: Patient: Virtual  Visit Location Patient: Home Provider: Virtual Visit Location Provider: Home Office   I discussed the limitations of evaluation and management by telemedicine and the availability of in person appointments. The patient expressed understanding and agreed to proceed.    History of Present Illness: [name] is a 51 y.o. who identifies as a female who was assigned female at birth, and is being seen today for > 2 week of head congestion, sinus pressure and scratchy throat. Was initially improving but over past several days has worsened again. Notes sinus pain, increased sinus congestion with nasal drainage turning from clear to thick and green. Some voice hoarseness that is new as of today. Has tested for COVID x 3 since symptom onset and negative.  Is taking Dayquil sinus -- day and night.   HPI: HPI  Problems:  Patient Active Problem List   Diagnosis Date Noted   Allergic rhinitis 02/24/2017   Malignant neoplasm of upper-outer quadrant of right breast in female, estrogen receptor positive ([place]) 08/24/2016    Allergies:  Allergies  Allergen Reactions   Erythromycin Nausea And Vomiting   Medications:  Current Outpatient Medications:    benzonatate (TESSALON) 100 MG capsule, Take 1 capsule (100 mg total) by mouth 3 (three) times daily as needed for cough., Disp: 30 capsule, Rfl: 0   doxycycline (VIBRA-TABS) 100 MG tablet, Take 1 tablet (100 mg total) by mouth 2 (two) times daily., Disp: 20 tablet, Rfl: 0   fluticasone (FLONASE) 50 MCG/ACT nasal spray, Place 2 sprays into  both nostrils daily., Disp: 16 g, Rfl: 0   Calcium Carb-Cholecalciferol (CALCIUM 500 + D3 PO), , Disp: , Rfl:    Collagen Hydrolysate, Bovine, POWD, , Disp: , Rfl:    Cyanocobalamin (B-12 PO), Take 1 tablet by mouth daily. , Disp: , Rfl:    diphenhydrAMINE HCl, Sleep, (ZZZQUIL) 25 MG CAPS, , Disp: , Rfl:    fexofenadine (ALLEGRA) 180 MG tablet, Take 180 mg by mouth daily as needed for allergies. , Disp: , Rfl:     hydrocortisone (ANUSOL-HC) 25 MG suppository, Place 25 mg rectally daily as needed., Disp: , Rfl:    Melatonin 10 MG TABS, Take 1 tablet by mouth at bedtime. , Disp: , Rfl:    Multiple Vitamin (MULTIVITAMIN) tablet, Take 1 tablet by mouth daily., Disp: , Rfl:    Omega 3 1200 MG CAPS, , Disp: , Rfl:    Psyllium (METAMUCIL FIBER PO), Take 2 capsules by mouth daily. , Disp: , Rfl:    tamoxifen (NOLVADEX) 20 MG tablet, TAKE 1 TABLET BY MOUTH EVERY DAY, Disp: 30 tablet, Rfl: 11   vitamin C (ASCORBIC ACID) 500 MG tablet, Take 500 mg by mouth daily., Disp: , Rfl:   Observations/Objective: Patient is well-developed, well-nourished in no acute distress.  Resting comfortably at home.  Head is normocephalic, atraumatic.  No labored breathing. Speech is clear and coherent with logical content.  Patient is alert and oriented at baseline.   Assessment and Plan: 1. Acute bacterial sinusitis - fluticasone (FLONASE) 50 MCG/ACT nasal spray; Place 2 sprays into both nostrils daily.  Dispense: 16 g; Refill: 0 - doxycycline (VIBRA-TABS) 100 MG tablet; Take 1 tablet (100 mg total) by mouth 2 (two) times daily.  Dispense: 20 tablet; Refill: 0 - benzonatate (TESSALON) 100 MG capsule; Take 1 capsule (100 mg total) by mouth 3 (three) times daily as needed for cough.  Dispense: 30 capsule; Refill: 0  Rx Doxycycline.  Increase fluids.  Rest.  Saline nasal spray.  Probiotic.  Mucinex as directed.  Humidifier in bedroom. Tessalon and Flonase per orders.  Call or return to clinic if symptoms are not improving.   Follow Up Instructions: I discussed the assessment and treatment plan with the patient. The patient was provided an opportunity to ask questions and all were answered. The patient agreed with the plan and demonstrated an understanding of the instructions.  A copy of instructions were sent to the patient via MyChart unless otherwise noted below.   The patient was advised to call back or seek an in-person  evaluation if the symptoms worsen or if the condition fails to improve as anticipated.  Time:  I spent 10 minutes with the patient via telehealth technology discussing the above problems/concerns.    Leeanne Rio, PA-C

## 2022-05-03 ENCOUNTER — Other Ambulatory Visit: Payer: Self-pay | Admitting: Hematology

## 2022-05-03 DIAGNOSIS — C50911 Malignant neoplasm of unspecified site of right female breast: Secondary | ICD-10-CM

## 2022-07-27 ENCOUNTER — Ambulatory Visit
Admission: RE | Admit: 2022-07-27 | Discharge: 2022-07-27 | Disposition: A | Discharge status: Home / Self Care | Payer: 59 | Source: Ambulatory Visit | Attending: Hematology | Admitting: Hematology

## 2022-07-27 DIAGNOSIS — Z170 Estrogen receptor positive status [ER+]: Secondary | ICD-10-CM

## 2022-07-27 MED ORDER — GADOPICLENOL 0.5 MMOL/ML IV SOLN
6.0000 mL | Freq: Once | INTRAVENOUS | Status: AC | PRN
  Administered 2022-07-27: 6 mL via INTRAVENOUS

## 2022-08-06 ENCOUNTER — Other Ambulatory Visit: Payer: 59

## 2022-10-14 ENCOUNTER — Telehealth: Payer: Self-pay | Admitting: Hematology

## 2022-10-14 ENCOUNTER — Other Ambulatory Visit: Payer: Self-pay | Admitting: Hematology

## 2022-10-14 DIAGNOSIS — Z1231 Encounter for screening mammogram for malignant neoplasm of breast: Secondary | ICD-10-CM

## 2022-10-14 NOTE — Telephone Encounter (Signed)
Patient is aware of appointment being rescheduled for a new date and time.

## 2022-11-16 ENCOUNTER — Other Ambulatory Visit: Payer: 59

## 2022-11-16 ENCOUNTER — Ambulatory Visit: Payer: 59 | Admitting: Hematology

## 2022-11-27 ENCOUNTER — Other Ambulatory Visit: Payer: Self-pay

## 2022-11-27 DIAGNOSIS — C50411 Malignant neoplasm of upper-outer quadrant of right female breast: Secondary | ICD-10-CM

## 2022-11-29 NOTE — Assessment & Plan Note (Addendum)
stage IA (pT1bN0M0), Invasive Lobular Carcinoma, ER/PR Positive, Her2 Negative, Grade I  -diagnosed in 07/2016. S/p right lumpectomy with SLNB and adjuvant radiation.  -She started Tamoxifen in 11/2016, experiencing manageable hot flashes. Plan for 10 years. -Last period 05/2018. Her 01/2020 FSH was still pre-menopausal.  -genetic testing with her GYN was negative. -she will continue annual MM and breast MRI for screening -She is clinically doing well. Lab reviewed, WNL. Physical exam was unremarkable. There is no clinical concern for recurrence. -she is now over 5 years out from diagnosis. I reviewed that her risk of recurrence is significantly decreased. -Continue Tamoxifen, for a total of 10 years   -F/u in 1 year

## 2022-11-30 ENCOUNTER — Inpatient Hospital Stay: Payer: 59 | Attending: Hematology

## 2022-11-30 ENCOUNTER — Other Ambulatory Visit: Payer: Self-pay

## 2022-11-30 ENCOUNTER — Encounter: Payer: Self-pay | Admitting: Hematology

## 2022-11-30 ENCOUNTER — Inpatient Hospital Stay: Payer: 59 | Admitting: Hematology

## 2022-11-30 ENCOUNTER — Ambulatory Visit
Admission: RE | Admit: 2022-11-30 | Discharge: 2022-11-30 | Disposition: A | Discharge status: Home / Self Care | Payer: 59 | Source: Ambulatory Visit | Attending: Hematology | Admitting: Hematology

## 2022-11-30 VITALS — BP 143/84 | HR 55 | Temp 98.5°F | Resp 16 | Ht 67.0 in | Wt 150.0 lb

## 2022-11-30 DIAGNOSIS — Z1231 Encounter for screening mammogram for malignant neoplasm of breast: Secondary | ICD-10-CM

## 2022-11-30 DIAGNOSIS — C50411 Malignant neoplasm of upper-outer quadrant of right female breast: Secondary | ICD-10-CM | POA: Diagnosis not present

## 2022-11-30 DIAGNOSIS — Z79810 Long term (current) use of selective estrogen receptor modulators (SERMs): Secondary | ICD-10-CM | POA: Diagnosis not present

## 2022-11-30 DIAGNOSIS — Z170 Estrogen receptor positive status [ER+]: Secondary | ICD-10-CM | POA: Insufficient documentation

## 2022-11-30 LAB — CMP (CANCER CENTER ONLY)
ALT: 15 U/L (ref 0–44)
AST: 19 U/L (ref 15–41)
Albumin: 4 g/dL (ref 3.5–5.0)
Alkaline Phosphatase: 49 U/L (ref 38–126)
Anion gap: 4 — ABNORMAL LOW (ref 5–15)
BUN: 23 mg/dL — ABNORMAL HIGH (ref 6–20)
CO2: 29 mmol/L (ref 22–32)
Calcium: 9.3 mg/dL (ref 8.9–10.3)
Chloride: 106 mmol/L (ref 98–111)
Creatinine: 0.83 mg/dL (ref 0.44–1.00)
GFR, Estimated: >60 mL/min (ref >60)
Glucose, Bld: 111 mg/dL — ABNORMAL HIGH (ref 70–99)
Potassium: 4.1 mmol/L (ref 3.5–5.1)
Sodium: 139 mmol/L (ref 135–145)
Total Bilirubin: 0.6 mg/dL (ref 0.3–1.2)
Total Protein: 6.5 g/dL (ref 6.5–8.1)

## 2022-11-30 LAB — CBC WITH DIFFERENTIAL (CANCER CENTER ONLY)
Abs Immature Granulocytes: 0.01 10*3/uL (ref 0.00–0.07)
Basophils Absolute: 0 10*3/uL (ref 0.0–0.1)
Basophils Relative: 1 %
Eosinophils Absolute: 0 10*3/uL (ref 0.0–0.5)
Eosinophils Relative: 0 %
HCT: 37.7 % (ref 36.0–46.0)
Hemoglobin: 13.4 g/dL (ref 12.0–15.0)
Immature Granulocytes: 0 %
Lymphocytes Relative: 34 %
Lymphs Abs: 2.1 10*3/uL (ref 0.7–4.0)
MCH: 33.7 pg (ref 26.0–34.0)
MCHC: 35.5 g/dL (ref 30.0–36.0)
MCV: 94.7 fL (ref 80.0–100.0)
Monocytes Absolute: 0.5 10*3/uL (ref 0.1–1.0)
Monocytes Relative: 8 %
Neutro Abs: 3.6 10*3/uL (ref 1.7–7.7)
Neutrophils Relative %: 57 %
Platelet Count: 191 10*3/uL (ref 150–400)
RBC: 3.98 MIL/uL (ref 3.87–5.11)
RDW: 12.2 % (ref 11.5–15.5)
WBC Count: 6.3 10*3/uL (ref 4.0–10.5)
nRBC: 0 % (ref 0.0–0.2)

## 2022-11-30 NOTE — Progress Notes (Signed)
[facility]   Telephone:([phone] Fax:([phone]) [phone]   Clinic Follow up Note   Patient Care Team: [place], [name], MD as PCP - General (Family Medicine) Harriette Bouillon, MD as Consulting Physician (General Surgery) Dorothy Puffer, MD as Consulting Physician (Radiation Oncology) Malachy Mood, MD as Consulting Physician (Hematology) Axel Filler, Larna Daughters, NP as Nurse Practitioner (Hematology and Oncology) Zelphia Cairo, MD as Consulting Physician (Obstetrics and Gynecology)  Date of Service:  11/30/2022  CHIEF COMPLAINT: f/u of  right breast cancer    CURRENT THERAPY:  Tamoxifen 20mg  daily started in 12/07/16     ASSESSMENT:  [name] is a 52 y.o. female with   Malignant neoplasm of upper-outer quadrant of right breast in female, 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 S/p right lumpectomy with SLNB and adjuvant radiation.  -She started Tamoxifen in 11/2016, experiencing manageable hot flashes. Plan for 10 years. -Last period 05/2018. Her 01/2020 FSH was still pre-menopausal.  -genetic testing with her GYN was negative. -she will continue annual MM and breast MRI for screening -She is clinically doing well. Lab reviewed, WNL. Physical exam was unremarkable. There is no clinical concern for recurrence. -she is now over 5 years out from diagnosis. I reviewed that her risk of recurrence is significantly decreased. -Continue Tamoxifen, for a total of 10 years, she is tolerating well with mild hot flashes -She gained some weight in the past year, likely secondary to the stress from her father's death, and menopause.  We discussed low-carb diet and increase exercise.  She will watch her weight closely. -F/u in 1 year    PLAN: -lab reviewed - will continue Tamoxifen -I order MR  Breast in January -lab and f/u in 1 year.  SUMMARY OF ONCOLOGIC  HISTORY: Oncology History Overview Note  Cancer Staging Malignant neoplasm of upper-outer quadrant of right breast in female, estrogen receptor positive (HCC) Staging form: Breast, AJCC 8th Edition - Pathologic stage from 08/27/2016: Stage IA (pT1a(m), pN0, cM0, G1, ER: Positive, PR: Positive, HER2: Negative) - Signed by Malachy Mood, MD on 09/09/2016 - Pathologic: No stage assigned - Unsigned     Malignant neoplasm of upper-outer quadrant of right breast in female, estrogen receptor positive (HCC)  08/05/2016 Mammogram   Targeted ultrasound of the right breast was performed demonstrating an irregular hypoechoic mass at 12:30, 5 cm from the nipple measuring 6 x 4 x 7 mm which is thought to correspond to the area of distortion seen mammographically. Targeted ultrasound of the right axilla demonstrates no suspicious appearing axillary lymph nodes.   08/06/2016 Receptors her2   ER 100%+, PR 90%+, HER2-, Ki67 10%   08/06/2016 Initial Biopsy   Right breast 12:30 biopsy showed invasive lobular carcinoma and LCIS   08/19/2016 Imaging   breast MRI showed a 7 x 6 x 7 mm enhancing mass in the posterior third of the upper inner quadrant of the right breast.   08/24/2016 Initial Diagnosis   Malignant neoplasm of upper-outer quadrant of right breast in female, estrogen receptor positive (HCC)   08/27/2016 Surgery   Right lumpectomy and SLN biopsy, Dr. Luisa Hart    08/27/2016 Pathology Results   Right lumpectomy showed ILC and LCIS, G1, final margins are negative, all 6 nodes are negative    10/06/2016 - 11/24/2016 Radiation Therapy   With Dr.Moody. 1. The Right breast was treated to 50.4 Gy in 28 fractions at 1.8 Gy per fraction.  2. The Right breast was boosted to 10 Gy in 5 fractions at 2 Gy per fraction.    12/2016 -  Anti-estrogen oral therapy   Tamoxifen daily   08/06/2017 Mammogram   IMPRESSION: 1. No evidence of recurrent or new breast malignancy. 2. Benign postsurgical changes on the right.      08/09/2018 Mammogram   IMPRESSION: Stable right breast lumpectomy site. No mammographic evidence of malignancy in the bilateral breasts.   12/30/2018 Breast US   LEFT BREAST US FINDINGS: On physical exam,there is a small nodule near the chest in the deep left axilla. Ultrasound is performed, showing normal sized, normal morphology lymph nodes in the left axilla. There are no enlarged or abnormal lymph nodes. There are no axillary masses. IMPRESSION: Negative exam.  No abnormal left axillary lymph nodes.        INTERVAL HISTORY:  [name] is here for a follow up of  right breast cancer. She was last seen by me on 11/15/2022. She presents to the clinic alone. Pt state that her father passed in November of last year. She believes that she has gotten through menopause.      All other systems were reviewed with the patient and are negative.  MEDICAL HISTORY:  Past Medical History:  Diagnosis Date   Allergy    Breast cancer (HCC) 08/2016   right   Chicken pox    Dental crown present    HIV infection (HCC)    TESTING FOR HIV   Personal history of radiation therapy    May-July 2018    Urinary tract infection     SURGICAL HISTORY: Past Surgical History:  Procedure Laterality Date   BREAST LUMPECTOMY Right 2018   BREAST LUMPECTOMY WITH RADIOACTIVE SEED AND SENTINEL LYMPH NODE BIOPSY Right 08/27/2016   Procedure: RIGHT BREAST LUMPECTOMY WITH RADIOACTIVE SEED AND SENTINEL LYMPH NODE BIOPSY;  Surgeon: Harriette Bouillon, MD;  Location: [facility];  Service: General;  Laterality: Right;   BREAST SURGERY     biopsy   DILATION AND EVACUATION  04/08/2008; 08/01/2008    I have reviewed the social history and family history with the patient and they are unchanged from previous note.  ALLERGIES:  is allergic to erythromycin.  MEDICATIONS:  Current Outpatient Medications  Medication Sig Dispense Refill   benzonatate (TESSALON) 100 MG capsule Take 1 capsule  (100 mg total) by mouth 3 (three) times daily as needed for cough. 30 capsule 0   Calcium Carb-Cholecalciferol (CALCIUM 500 + D3 PO)      Collagen Hydrolysate, Bovine, POWD      Cyanocobalamin (B-12 PO) Take 1 tablet by mouth daily.      diphenhydrAMINE HCl, Sleep, (ZZZQUIL) 25 MG CAPS      doxycycline (VIBRA-TABS) 100 MG tablet Take 1 tablet (100 mg total) by mouth 2 (two) times daily. 20 tablet 0   fexofenadine (ALLEGRA) 180 MG tablet Take 180 mg by mouth daily as needed for allergies.      fluticasone (FLONASE) 50 MCG/ACT nasal spray Place 2 sprays into both nostrils daily. 16 g 0   hydrocortisone (ANUSOL-HC) 25 MG suppository Place 25 mg rectally daily as needed.     Melatonin 10 MG TABS Take 1 tablet by mouth at bedtime.      Multiple Vitamin (MULTIVITAMIN) tablet Take 1 tablet by mouth daily.     Omega 3 1200 MG CAPS      Psyllium (METAMUCIL FIBER PO) Take 2 capsules by mouth daily.  tamoxifen (NOLVADEX) 20 MG tablet TAKE 1 TABLET BY MOUTH EVERY DAY 30 tablet 11   vitamin C (ASCORBIC ACID) 500 MG tablet Take 500 mg by mouth daily.     No current facility-administered medications for this visit.    PHYSICAL EXAMINATION: ECOG PERFORMANCE STATUS: 0 - Asymptomatic  Vitals:   11/30/22 1110  BP: (!) 143/84  Pulse: (!) 55  Resp: 16  Temp: 98.5 F (36.9 C)  SpO2: 100%   Wt Readings from Last 3 Encounters:  11/30/22 150 lb (68 kg)  11/14/21 141 lb 11.2 oz (64.3 kg)  03/07/21 137 lb 8 oz (62.4 kg)     GENERAL:alert, no distress and comfortable SKIN: skin color normal, no rashes or significant lesions EYES: normal, Conjunctiva are pink and non-injected, sclera clear  NEURO: alert & oriented x 3 with fluent speech NECK:(-)  supple, thyroid normal size, non-tender, without nodularity LYMPH: (-)  no palpable lymphadenopathy in the cervical, axillary  ABDOMEN:(-)abdomen soft, (-) non-tender and normal bowel sounds BREAST: RT breast No palpable mass breast exam benign. LT breast  no palpable mass breast exam benign.  LABORATORY DATA:  I have reviewed the data as listed    Latest Ref Rng & Units 11/30/2022   10:47 AM 11/14/2021   10:32 AM 03/07/2021    8:09 AM  CBC  WBC 4.0 - 10.5 K/uL 6.3  5.9  6.8   Hemoglobin 12.0 - 15.0 g/dL 16.1  09.6  04.5   Hematocrit 36.0 - 46.0 % 37.7  40.1  38.1   Platelets 150 - 400 K/uL 191  214  240         Latest Ref Rng & Units 11/30/2022   10:47 AM 11/14/2021   10:32 AM 03/07/2021    8:09 AM  CMP  Glucose 70 - 99 mg/dL 409  88  89   BUN 6 - 20 mg/dL 23  21  16    Creatinine 0.44 - 1.00 mg/dL 8.11  9.14  7.82   Sodium 135 - 145 mmol/L 139  137  138   Potassium 3.5 - 5.1 mmol/L 4.1  5.2  4.3   Chloride 98 - 111 mmol/L 106  105  103   CO2 22 - 32 mmol/L 29  28  27    Calcium 8.9 - 10.3 mg/dL 9.3  9.6  9.6   Total Protein 6.5 - 8.1 g/dL 6.5  7.0  7.2   Total Bilirubin 0.3 - 1.2 mg/dL 0.6  0.7  1.1   Alkaline Phos 38 - 126 U/L 49  48  45   AST 15 - 41 U/L 19  20  23    ALT 0 - 44 U/L 15  15  19        RADIOGRAPHIC STUDIES: I have personally reviewed the radiological images as listed and agreed with the findings in the report. No results found.    Orders Placed This Encounter  Procedures   MR BREAST BILATERAL W WO CONTRAST INC CAD    Standing Status:   Future    Standing Expiration Date:   11/30/2023    Order Specific Question:   If indicated for the ordered procedure, I authorize the administration of contrast media per Radiology protocol    Answer:   Yes    Order Specific Question:   What is the patient's sedation requirement?    Answer:   No Sedation    Order Specific Question:   Does the patient have a pacemaker or implanted devices?  Answer:   No    Order Specific Question:   Radiology Contrast Protocol - do NOT remove file path    Answer:   [email or web address]    Order Specific Question:   Preferred imaging location?    Answer:   [facility] (table limit - 550  lbs)    Order Specific Question:   Release to patient    Answer:   Immediate   All questions were answered. The patient knows to call the clinic with any problems, questions or concerns. No barriers to learning was detected. The total time spent in the appointment was 25 minutes.     Malachy Mood, MD 11/30/2022   Carolin Coy, CMA, am acting as scribe for Malachy Mood, MD.   I have reviewed the above documentation for accuracy and completeness, and I agree with the above.

## 2023-05-04 ENCOUNTER — Other Ambulatory Visit: Payer: Self-pay | Admitting: Hematology

## 2023-05-04 DIAGNOSIS — C50911 Malignant neoplasm of unspecified site of right female breast: Secondary | ICD-10-CM

## 2023-05-24 ENCOUNTER — Encounter (HOSPITAL_COMMUNITY): Payer: Self-pay

## 2023-05-24 ENCOUNTER — Ambulatory Visit (HOSPITAL_COMMUNITY): Admission: RE | Admit: 2023-05-24 | Payer: 59 | Source: Ambulatory Visit

## 2023-07-28 ENCOUNTER — Ambulatory Visit (HOSPITAL_COMMUNITY)
Admission: RE | Admit: 2023-07-28 | Discharge: 2023-07-28 | Disposition: A | Discharge status: Home / Self Care | Payer: 59 | Source: Ambulatory Visit | Attending: Hematology | Admitting: Hematology

## 2023-07-28 DIAGNOSIS — Z170 Estrogen receptor positive status [ER+]: Secondary | ICD-10-CM | POA: Diagnosis present

## 2023-07-28 DIAGNOSIS — C50411 Malignant neoplasm of upper-outer quadrant of right female breast: Secondary | ICD-10-CM | POA: Insufficient documentation

## 2023-07-28 MED ORDER — GADOBUTROL 1 MMOL/ML IV SOLN
7.0000 mL | Freq: Once | INTRAVENOUS | Status: AC | PRN
  Administered 2023-07-28: 7 mL via INTRAVENOUS

## 2023-08-13 ENCOUNTER — Encounter: Payer: Self-pay | Admitting: Hematology

## 2023-11-02 ENCOUNTER — Other Ambulatory Visit: Payer: Self-pay | Admitting: Hematology

## 2023-11-02 DIAGNOSIS — Z1231 Encounter for screening mammogram for malignant neoplasm of breast: Secondary | ICD-10-CM

## 2023-11-29 ENCOUNTER — Ambulatory Visit: Payer: 59 | Admitting: Hematology

## 2023-11-29 ENCOUNTER — Other Ambulatory Visit: Payer: 59

## 2023-12-03 ENCOUNTER — Other Ambulatory Visit: Payer: Self-pay

## 2023-12-03 DIAGNOSIS — Z170 Estrogen receptor positive status [ER+]: Secondary | ICD-10-CM

## 2023-12-05 NOTE — Assessment & Plan Note (Signed)
 stage IA (pT1bN0M0), Invasive Lobular Carcinoma, ER/PR Positive, Her2 Negative, Grade I  -diagnosed in 07/2016. S/p right lumpectomy with SLNB and adjuvant radiation.  -She started Tamoxifen  in 11/2016, experiencing manageable hot flashes. Plan for 10 years. -Last period 05/2018. Her 01/2020 FSH was still pre-menopausal.  -genetic testing with her GYN was negative. -she will continue annual MM and breast MRI for screening -She is clinically doing well. Lab reviewed, WNL. Physical exam was unremarkable. Ther

## 2023-12-06 ENCOUNTER — Encounter: Payer: Self-pay | Admitting: Hematology

## 2023-12-06 ENCOUNTER — Inpatient Hospital Stay (HOSPITAL_BASED_OUTPATIENT_CLINIC_OR_DEPARTMENT_OTHER): Payer: 59 | Admitting: Hematology

## 2023-12-06 ENCOUNTER — Inpatient Hospital Stay: Payer: 59 | Attending: Hematology

## 2023-12-06 VITALS — BP 130/88 | HR 58 | Temp 97.6°F | Resp 17 | Wt 151.1 lb

## 2023-12-06 DIAGNOSIS — C50411 Malignant neoplasm of upper-outer quadrant of right female breast: Secondary | ICD-10-CM

## 2023-12-06 DIAGNOSIS — Z1321 Encounter for screening for nutritional disorder: Secondary | ICD-10-CM

## 2023-12-06 DIAGNOSIS — Z79810 Long term (current) use of selective estrogen receptor modulators (SERMs): Secondary | ICD-10-CM | POA: Insufficient documentation

## 2023-12-06 DIAGNOSIS — Z9189 Other specified personal risk factors, not elsewhere classified: Secondary | ICD-10-CM

## 2023-12-06 DIAGNOSIS — C50911 Malignant neoplasm of unspecified site of right female breast: Secondary | ICD-10-CM | POA: Diagnosis not present

## 2023-12-06 DIAGNOSIS — Z170 Estrogen receptor positive status [ER+]: Secondary | ICD-10-CM | POA: Insufficient documentation

## 2023-12-06 LAB — CMP (CANCER CENTER ONLY)
ALT: 14 U/L (ref 0–44)
AST: 20 U/L (ref 15–41)
Albumin: 4.1 g/dL (ref 3.5–5.0)
Alkaline Phosphatase: 53 U/L (ref 38–126)
Anion gap: 5 (ref 5–15)
BUN: 22 mg/dL — ABNORMAL HIGH (ref 6–20)
CO2: 29 mmol/L (ref 22–32)
Calcium: 9.3 mg/dL (ref 8.9–10.3)
Chloride: 106 mmol/L (ref 98–111)
Creatinine: 0.86 mg/dL (ref 0.44–1.00)
GFR, Estimated: >60 mL/min (ref >60)
Glucose, Bld: 89 mg/dL (ref 70–99)
Potassium: 4.7 mmol/L (ref 3.5–5.1)
Sodium: 140 mmol/L (ref 135–145)
Total Bilirubin: 0.5 mg/dL (ref 0.0–1.2)
Total Protein: 6.8 g/dL (ref 6.5–8.1)

## 2023-12-06 LAB — CBC WITH DIFFERENTIAL (CANCER CENTER ONLY)
Abs Immature Granulocytes: 0.01 K/uL (ref 0.00–0.07)
Basophils Absolute: 0.1 K/uL (ref 0.0–0.1)
Basophils Relative: 1 %
Eosinophils Absolute: 0.1 K/uL (ref 0.0–0.5)
Eosinophils Relative: 3 %
HCT: 41.5 % (ref 36.0–46.0)
Hemoglobin: 14.1 g/dL (ref 12.0–15.0)
Immature Granulocytes: 0 %
Lymphocytes Relative: 43 %
Lymphs Abs: 2.5 K/uL (ref 0.7–4.0)
MCH: 31.8 pg (ref 26.0–34.0)
MCHC: 34 g/dL (ref 30.0–36.0)
MCV: 93.5 fL (ref 80.0–100.0)
Monocytes Absolute: 0.6 K/uL (ref 0.1–1.0)
Monocytes Relative: 10 %
Neutro Abs: 2.5 K/uL (ref 1.7–7.7)
Neutrophils Relative %: 43 %
Platelet Count: 192 K/uL (ref 150–400)
RBC: 4.44 MIL/uL (ref 3.87–5.11)
RDW: 12.3 % (ref 11.5–15.5)
WBC Count: 5.7 K/uL (ref 4.0–10.5)
nRBC: 0 % (ref 0.0–0.2)

## 2023-12-06 MED ORDER — TAMOXIFEN CITRATE 20 MG PO TABS: 20.0000 mg | ORAL_TABLET | Freq: Every day | ORAL | 11 refills | Status: AC

## 2023-12-06 NOTE — Progress Notes (Signed)
 [facility]   [phone] [phone]   Clinic Follow up Note   Patient Care Team: [place], [name], MD as PCP - General (Family Medicine) Vanderbilt Ned, MD as Consulting Physician (General Surgery) [name], MD as Consulting Physician (Radiation Oncology) [name], MD as Consulting Physician (Hematology) Crawford, [name], NP as Nurse Practitioner (Hematology and Oncology) [name], MD as Consulting Physician (Obstetrics and Gynecology)  Date of Service:  12/06/2023  CHIEF COMPLAINT: f/u of right breast cancer  CURRENT THERAPY:  Adjuvant tamoxifen   Oncology History   Malignant neoplasm of upper-outer quadrant of right breast in female, 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 S/p right lumpectomy with SLNB and adjuvant radiation.  -She started Tamoxifen  in 11/2016, experiencing manageable hot flashes. Plan for 10 years. -Last period 05/2018. Her 01/2020 FSH was still pre-menopausal.  -genetic testing with her GYN was negative. -she will continue annual MM and breast MRI for screening -She is clinically doing well. Lab reviewed, WNL. Physical exam was unremarkable. Ther  Assessment & Plan Breast cancer Status post-surgery and on tamoxifen  since July 2018. No new symptoms or complications. Intermittent hot flashes are fading. No joint pain or significant side effects from tamoxifen . No current vaginal bleeding, but had light, sporadic episodes. Ultrasound normal. Risk of endometrial cancer and blood clots with tamoxifen  discussed. - Continue tamoxifen  therapy - Monitor for vaginal bleeding and report if it occurs - Undergo follow-up ultrasound with saline infusion in September - Encourage regular physical activity to reduce blood clot risk - Order breast MRI for next year - Order vitamin D level in six months - Refill  tamoxifen  prescription at [facility] on [address] - She is clinically doing very well, lab reviewed, exam unremarkable - Continue tamoxifen , I refilled for 1 year for her - Next breast MRI in March 2026, mammogram in September 2025 - Lab and follow-up in 1 year     SUMMARY OF ONCOLOGIC HISTORY: Oncology History Overview Note  Cancer Staging Malignant neoplasm of upper-outer quadrant of right breast in female, estrogen receptor positive (HCC) Staging form: Breast, AJCC 8th Edition - Pathologic stage from 08/27/2016: Stage IA (pT1a(m), pN0, cM0, G1, ER: Positive, PR: Positive, HER2: Negative) - Signed by Callander Lanny, MD on 09/09/2016 - Pathologic: No stage assigned - Unsigned     Malignant neoplasm of upper-outer quadrant of right breast in female, estrogen receptor positive (HCC)  08/05/2016 Mammogram   Targeted ultrasound of the right breast was performed demonstrating an irregular hypoechoic mass at 12:30, 5 cm from the nipple measuring 6 x 4 x 7 mm which is thought to correspond to the area of distortion seen mammographically. Targeted ultrasound of the right axilla demonstrates no suspicious appearing axillary lymph nodes.   08/06/2016 Receptors her2   ER 100%+, PR 90%+, HER2-, Ki67 10%   08/06/2016 Initial Biopsy   Right breast 12:30 biopsy showed invasive lobular carcinoma and LCIS   08/19/2016 Imaging   breast MRI showed a 7 x 6 x 7 mm enhancing mass in the posterior third of the upper inner quadrant of the right breast.   08/24/2016 Initial Diagnosis   Malignant neoplasm of upper-outer quadrant of right breast in female, estrogen receptor positive (HCC)   08/27/2016 Surgery   Right lumpectomy and SLN biopsy, Dr. Vanderbilt    08/27/2016 Pathology Results   Right lumpectomy showed ILC and LCIS, G1, final margins are negative, all  6 nodes are negative    10/06/2016 - 11/24/2016 Radiation Therapy   With Dr.Moody. 1. The Right breast was treated to 50.4 Gy in 28 fractions at 1.8  Gy per fraction. 2. The Right breast was boosted to 10 Gy in 5 fractions at 2 Gy per fraction.    12/2016 -  Anti-estrogen oral therapy   Tamoxifen  daily   08/06/2017 Mammogram   IMPRESSION: 1. No evidence of recurrent or new breast malignancy. 2. Benign postsurgical changes on the right.     08/09/2018 Mammogram   IMPRESSION: Stable right breast lumpectomy site. No mammographic evidence of malignancy in the bilateral breasts.   12/30/2018 Breast US    LEFT BREAST US  FINDINGS: On physical exam,there is a small nodule near the chest in the deep left axilla. Ultrasound is performed, showing normal sized, normal morphology lymph nodes in the left axilla. There are no enlarged or abnormal lymph nodes. There are no axillary masses. IMPRESSION: Negative exam.  No abnormal left axillary lymph nodes.        Discussed the use of AI scribe software for clinical note transcription with the patient, who gave verbal consent to proceed.  History of Present Illness [name] is a 53 year old female with breast cancer who presents for follow-up.  She has been on tamoxifen  since July 2018 and experiences occasional hot flashes, which are decreasing. There is no joint pain or other significant symptoms. She experiences occasional tenderness at the incision site post-surgery.  She has had sporadic light vaginal bleeding, and an ultrasound appeared normal. A follow-up ultrasound with saline injection is scheduled in September to assess endometrial health. She is pre-menopausal with an FSH level of 17 and has not had a regular period for an extended period.  Her sleep has improved over the past year, and she is not taking any antidepressant medication. She has experienced weight gain, which has stabilized, attributed to menopause and the emotional impact of her father's death. She maintains an active lifestyle, focusing on weight training due to muscle mass loss, and uses a home gym.      All other systems were reviewed with the patient and are negative.  MEDICAL HISTORY:  Past Medical History:  Diagnosis Date   Allergy    Breast cancer (HCC) 08/2016   right   Chicken pox    Dental crown present    HIV infection (HCC)    TESTING FOR HIV   Personal history of radiation therapy    May-July 2018    Urinary tract infection     SURGICAL HISTORY: Past Surgical History:  Procedure Laterality Date   BREAST LUMPECTOMY Right 2018   BREAST LUMPECTOMY WITH RADIOACTIVE SEED AND SENTINEL LYMPH NODE BIOPSY Right 08/27/2016   Procedure: RIGHT BREAST LUMPECTOMY WITH RADIOACTIVE SEED AND SENTINEL LYMPH NODE BIOPSY;  Surgeon: Debby Shipper, MD;  Location: [facility];  Service: General;  Laterality: Right;   BREAST SURGERY     biopsy   DILATION AND EVACUATION  04/08/2008; 08/01/2008    I have reviewed the social history and family history with the patient and they are unchanged from previous note.  ALLERGIES:  is allergic to erythromycin.  MEDICATIONS:  Current Outpatient Medications  Medication Sig Dispense Refill   Calcium Carb-Cholecalciferol (CALCIUM 500 + D3 PO)      Collagen Hydrolysate, Bovine, POWD      Cyanocobalamin (B-12 PO) Take 1 tablet by mouth daily.      fexofenadine (ALLEGRA) 180 MG tablet  Take 180 mg by mouth daily as needed for allergies.      hydrocortisone  (ANUSOL -HC) 25 MG suppository Place 25 mg rectally daily as needed.     Melatonin 10 MG TABS Take 1 tablet by mouth at bedtime.      Multiple Vitamin (MULTIVITAMIN) tablet Take 1 tablet by mouth daily.     Omega 3 1200 MG CAPS      Psyllium (METAMUCIL FIBER PO) Take 2 capsules by mouth daily.      vitamin C (ASCORBIC ACID) 500 MG tablet Take 500 mg by mouth daily.     tamoxifen  (NOLVADEX ) 20 MG tablet Take 1 tablet (20 mg total) by mouth daily. 30 tablet 11   No current facility-administered medications for this visit.    PHYSICAL EXAMINATION: ECOG PERFORMANCE STATUS: 0 -  Asymptomatic  Vitals:   12/06/23 0822  BP: 130/88  Pulse: (!) 58  Resp: 17  Temp: 97.6 F (36.4 C)  SpO2: 98%   Wt Readings from Last 3 Encounters:  12/06/23 151 lb 1.6 oz (68.5 kg)  11/30/22 150 lb (68 kg)  11/14/21 141 lb 11.2 oz (64.3 kg)     GENERAL:alert, no distress and comfortable SKIN: skin color, texture, turgor are normal, no rashes or significant lesions EYES: normal, Conjunctiva are pink and non-injected, sclera clear NECK: supple, thyroid normal size, non-tender, without nodularity LYMPH:  no palpable lymphadenopathy in the cervical, axillary  LUNGS: clear to auscultation and percussion with normal breathing effort HEART: regular rate & rhythm and no murmurs and no lower extremity edema ABDOMEN:abdomen soft, non-tender and normal bowel sounds Musculoskeletal:no cyanosis of digits and no clubbing  NEURO: alert & oriented x 3 with fluent speech, no focal motor/sensory deficits BREAST: Incision in breast and axilla visible with minimal scar tissue.  No palpable breast mass or axillary adenopathy. Physical Exam   LABORATORY DATA:  I have reviewed the data as listed    Latest Ref Rng & Units 12/06/2023    7:37 AM 11/30/2022   10:47 AM 11/14/2021   10:32 AM  CBC  WBC 4.0 - 10.5 K/uL 5.7  6.3  5.9   Hemoglobin 12.0 - 15.0 g/dL 85.8  86.5  85.9   Hematocrit 36.0 - 46.0 % 41.5  37.7  40.1   Platelets 150 - 400 K/uL 192  191  214         Latest Ref Rng & Units 12/06/2023    7:37 AM 11/30/2022   10:47 AM 11/14/2021   10:32 AM  CMP  Glucose 70 - 99 mg/dL 89  888  88   BUN 6 - 20 mg/dL 22  23  21    Creatinine 0.44 - 1.00 mg/dL 9.13  9.16  8.90   Sodium 135 - 145 mmol/L 140  139  137   Potassium 3.5 - 5.1 mmol/L 4.7  4.1  5.2   Chloride 98 - 111 mmol/L 106  106  105   CO2 22 - 32 mmol/L 29  29  28    Calcium 8.9 - 10.3 mg/dL 9.3  9.3  9.6   Total Protein 6.5 - 8.1 g/dL 6.8  6.5  7.0   Total Bilirubin 0.0 - 1.2 mg/dL 0.5  0.6  0.7   Alkaline Phos 38 - 126 U/L 53   49  48   AST 15 - 41 U/L 20  19  20    ALT 0 - 44 U/L 14  15  15        RADIOGRAPHIC STUDIES: I  have personally reviewed the radiological images as listed and agreed with the findings in the report. No results found.    Orders Placed This Encounter  Procedures   MR Breast Bilateral Wo Contrast    Standing Status:   Future    Expected Date:   08/04/2024    Expiration Date:   12/05/2024    What is the patient's sedation requirement?:   No Sedation    Does the patient have a pacemaker or implanted devices?:   No    Preferred imaging location?:   [facility] (table limit-550lbs)   Vitamin D 25 hydroxy    Standing Status:   Future    Expected Date:   06/07/2024    Expiration Date:   12/05/2024   All questions were answered. The patient knows to call the clinic with any problems, questions or concerns. No barriers to learning was detected. The total time spent in the appointment was 25 minutes, including review of chart and various tests results, discussions about plan of care and coordination of care plan     Onita Mattock, MD 12/06/2023

## 2023-12-07 NOTE — Addendum Note (Signed)
 Addended by: [name] on: 12/07/2023 08:56 AM   Modules accepted: Orders

## 2024-01-25 ENCOUNTER — Ambulatory Visit
Admission: RE | Admit: 2024-01-25 | Discharge: 2024-01-25 | Disposition: A | Discharge status: Home / Self Care | Source: Ambulatory Visit | Attending: Hematology

## 2024-01-25 DIAGNOSIS — Z1231 Encounter for screening mammogram for malignant neoplasm of breast: Secondary | ICD-10-CM

## 2024-02-14 ENCOUNTER — Telehealth: Admitting: Physician Assistant

## 2024-02-14 DIAGNOSIS — L03116 Cellulitis of left lower limb: Secondary | ICD-10-CM

## 2024-02-14 DIAGNOSIS — W57XXXA Bitten or stung by nonvenomous insect and other nonvenomous arthropods, initial encounter: Secondary | ICD-10-CM

## 2024-02-14 DIAGNOSIS — S90562A Insect bite (nonvenomous), left ankle, initial encounter: Secondary | ICD-10-CM

## 2024-02-14 MED ORDER — TRIAMCINOLONE ACETONIDE 0.1 % EX CREA: 1.0000 | TOPICAL_CREAM | Freq: Two times a day (BID) | CUTANEOUS | 0 refills | Status: AC

## 2024-02-14 MED ORDER — CEPHALEXIN 500 MG PO CAPS: 500.0000 mg | ORAL_CAPSULE | Freq: Four times a day (QID) | ORAL | 0 refills | Status: AC

## 2024-02-14 NOTE — Patient Instructions (Signed)
 Jon Favors Bogue, thank you for joining Delon CHRISTELLA Dickinson, PA-C for today's virtual visit.  While this provider is not your primary care provider (PCP), if your PCP is located in our provider database this encounter information will be shared with them immediately following your visit.   A [facility] MyChart account gives you access to today's visit and all your visits, tests, and labs performed at [facility]  click here if you don't have a [facility] MyChart account or go to [email or web address]  Consent: (Patient) Areya Lemmerman Bertoni provided verbal consent for this virtual visit at the beginning of the encounter.  Current Medications:  Current Outpatient Medications:    cephALEXin (KEFLEX) 500 MG capsule, Take 1 capsule (500 mg total) by mouth 4 (four) times daily., Disp: 20 capsule, Rfl: 0   triamcinolone cream (KENALOG) 0.1 %, Apply 1 Application topically 2 (two) times daily., Disp: 30 g, Rfl: 0   Calcium Carb-Cholecalciferol (CALCIUM 500 + D3 PO), , Disp: , Rfl:    Collagen Hydrolysate, Bovine, POWD, , Disp: , Rfl:    Cyanocobalamin (B-12 PO), Take 1 tablet by mouth daily. , Disp: , Rfl:    fexofenadine (ALLEGRA) 180 MG tablet, Take 180 mg by mouth daily as needed for allergies. , Disp: , Rfl:    hydrocortisone  (ANUSOL -HC) 25 MG suppository, Place 25 mg rectally daily as needed., Disp: , Rfl:    Melatonin 10 MG TABS, Take 1 tablet by mouth at bedtime. , Disp: , Rfl:    Multiple Vitamin (MULTIVITAMIN) tablet, Take 1 tablet by mouth daily., Disp: , Rfl:    Omega 3 1200 MG CAPS, , Disp: , Rfl:    Psyllium (METAMUCIL FIBER PO), Take 2 capsules by mouth daily. , Disp: , Rfl:    tamoxifen  (NOLVADEX ) 20 MG tablet, Take 1 tablet (20 mg total) by mouth daily., Disp: 30 tablet, Rfl: 11   vitamin C (ASCORBIC ACID) 500 MG tablet, Take 500 mg by mouth daily., Disp: , Rfl:    Medications ordered in this encounter:  Meds ordered this encounter  Medications    cephALEXin (KEFLEX) 500 MG capsule    Sig: Take 1 capsule (500 mg total) by mouth 4 (four) times daily.    Dispense:  20 capsule    Refill:  0    Supervising Provider:   LAMPTEY, PHILIP O [[id number]]   triamcinolone cream (KENALOG) 0.1 %    Sig: Apply 1 Application topically 2 (two) times daily.    Dispense:  30 g    Refill:  0    Supervising Provider:   BLAISE ALEENE KIDD [[id number]]     *If you need refills on other medications prior to your next appointment, please contact your pharmacy*  Follow-Up: Call back or seek an in-person evaluation if the symptoms worsen or if the condition fails to improve as anticipated.  [facility] [phone]  Other Instructions Insect Bite, Adult An insect bite can make your skin red, itchy, and swollen. An insect bite is different from an insect sting, which happens when an insect injects poison (venom) into the skin. Some insects can spread disease to people through a bite. However, most insect bites do not lead to disease and are not serious. What are the causes? Insects may bite for a variety of reasons, including: Hunger. To defend themselves. Insects that bite include: Spiders. Mosquitoes and flies. Ticks and fleas. Ants. Kissing bugs. Chiggers. What are the signs or symptoms? In many cases, symptoms last for  2-4 days. However, itching can last up to 10 days. Symptoms include: Itching or pain in the bite area. Redness and swelling in the bite area. An open wound (skin ulcer). In rare cases, a person may have a severe allergic reaction (anaphylactic reaction) to a bite. Symptoms of an anaphylactic reaction may include: Feeling warm in the face (flushed). This may include redness. Itchy, red, swollen areas of skin (hives). Swelling of the eyes, lips, face, mouth, tongue, or throat. Wheezing or difficulty breathing, speaking, or swallowing. Dizziness, light-headedness, or fainting. Abdominal symptoms like cramping, nausea,  vomiting, or diarrhea. How is this diagnosed? This condition is usually diagnosed based on symptoms and a physical exam. During the exam, your health care provider will look at the bite and ask you what kind of insect bit you. How is this treated? Most insect bites are not serious. Symptoms often go away on their own and treatment is not usually needed. When treatment is recommended, it may include: Applying ice to the affected area. Applying steroid or other anti-itch creams, like calamine lotion, to the bite area. Medicines called antihistamines to reduce itching. You may also need: A tetanus shot if you are not up to date. Antibiotic cream or an oral antibiotic if the bite becomes infected (this is uncommon). Follow these instructions at home: Bite area care  Do not scratch the bite area. It may help to cover the bite area with a bandage or close-fitting clothing. Keep the bite area clean and dry. Wash it every day with soap and water as told by your health care provider. Check the bite area every day for signs of infection. Check for: More redness, swelling, or pain. Fluid or blood. Warmth. Pus or a bad smell. Managing pain, itching, and swelling  You may apply cortisone cream, calamine lotion, or a paste made of baking soda and water to the bite area as told by your health care provider. If directed, put ice on the bite area. To do this: Put ice in a plastic bag. Place a towel between your skin and the bag. Leave the ice on for 20 minutes, 2-3 times a day. If your skin turns bright red, remove the ice right away to prevent skin damage. The risk of skin damage is higher if you cannot feel pain, heat, or cold. General instructions Apply or take over-the-counter and prescription medicine only as told by your health care provider. If you were prescribed antibiotics, take or apply them as told by your health care provider. Do not stop using the antibiotic even if you start to feel  better. How is this prevented? To help reduce your risk of insect bites: When you are outdoors, wear clothing that covers your arms and legs. This is especially important in the early morning and evening. Use insect repellent. The best insect repellents contain DEET, picaridin, oil of lemon eucalyptus (OLE), or IR3535. Consider spraying your clothing with a pesticide called permethrin. Permethrin helps prevent insect bites. It works for several weeks and for up to 5-6 clothing washes. Do not apply permethrin directly to the skin. If your home windows do not have screens, consider installing them. If you will be sleeping in an area where there are mosquitoes, consider covering your sleeping area with a mosquito net. Contact a health care provider if: Your bite area has signs of infection, such as: More redness, swelling, or pain. Fluid or blood. Warmth. Pus or a bad smell. You have a fever. Get help right away if:  You have a rash. You have muscle or joint pain. You feel unusually tired or weak. You have neck pain or a headache. You develop symptoms of an anaphylactic reaction. These may include: Swelling of the eyes, lips, face, mouth, tongue, or throat. Flushed skin or hives. Wheezing. Difficulty breathing, speaking, or swallowing. Dizziness, light-headedness, or fainting. Abdominal pain, cramping, vomiting, or diarrhea. These symptoms may be an emergency. Get help right away. Call 911. Do not wait to see if the symptoms will go away. Do not drive yourself to the hospital. Summary An insect bite can make your skin red, itchy, and swollen. Treatment is usually not needed. Symptoms often go away on their own. When treatment is recommended, it may involve taking medicine, applying medicine to the area, or applying ice. Apply or take over-the-counter and prescription medicines only as told by your health care provider. Use insect repellent to help prevent insect bites. Contact a health  care provider if your bite area has signs of infection. This information is not intended to replace advice given to you by your health care provider. Make sure you discuss any questions you have with your health care provider. Document Revised: 08/13/2021 Document Reviewed: 07/29/2021 Elsevier Patient Education  2024 Elsevier Inc.   If you have been instructed to have an in-person evaluation today at a local Urgent Care facility, please use the link below. It will take you to a list of all of our available [facility], including address, phone number and hours of operation. Please do not delay care.  [facility]  If you or a family member do not have a primary care provider, use the link below to schedule a visit and establish care. When you choose a [facility] primary care physician or advanced practice provider, you gain a long-term partner in health. Find a Primary Care Provider  Learn more about [facility]'s in-office and virtual care options: [facility] - Get Care Now

## 2024-02-14 NOTE — Progress Notes (Signed)
 Virtual Visit Consent   [name], you are scheduled for a virtual visit with a [facility] provider today. Just as with appointments in the office, your consent must be obtained to participate. Your consent will be active for this visit and any virtual visit you may have with one of our providers in the next 365 days. If you have a MyChart account, a copy of this consent can be sent to you electronically.  As this is a virtual visit, video technology does not allow for your provider to perform a traditional examination. This may limit your provider's ability to fully assess your condition. If your provider identifies any concerns that need to be evaluated in person or the need to arrange testing (such as labs, EKG, etc.), we will make arrangements to do so. Although advances in technology are sophisticated, we cannot ensure that it will always work on either your end or our end. If the connection with a video visit is poor, the visit may have to be switched to a telephone visit. With either a video or telephone visit, we are not always able to ensure that we have a secure connection.  By engaging in this virtual visit, you consent to the provision of healthcare and authorize for your insurance to be billed (if applicable) for the services provided during this visit. Depending on your insurance coverage, you may receive a charge related to this service.  I need to obtain your verbal consent now. Are you willing to proceed with your visit today? [name] has provided verbal consent on 02/14/2024 for a virtual visit (video or telephone). Delon CHRISTELLA Dickinson, PA-C  Date: 02/14/2024 9:11 AM   Virtual Visit via Video Note   I, Delon CHRISTELLA Dickinson, connected with  [name]  ([id number], 1971-03-27) on 02/14/24 at  8:00 AM EDT by a video-enabled telemedicine application and verified that I am speaking with the correct person using two identifiers.  Location: Patient:  Virtual Visit Location Patient: Home Provider: Virtual Visit Location Provider: Home Office   I discussed the limitations of evaluation and management by telemedicine and the availability of in person appointments. The patient expressed understanding and agreed to proceed.    History of Present Illness: [name] is a 53 y.o. who identifies as a female who was assigned female at birth, and is being seen today for insect bites on the left lower ankle. Occurred at a [employer] [address] [employer] last weekend (09/20-09/21). Woke up around 3 am feeling like something had bit her. Has been treating with OTC treatments, wrapping with bandage for drainage, and soaking. Now has developed redness surrounding a couple of the lesions and the largest is starting to have some red streaking (minimal) from the wound. There continues to be drainage (clear) and the wounds have become more painful. Denies fevers, chills, nausea, vomiting.    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 , Disp: , Rfl:    fexofenadine (ALLEGRA) 180 MG tablet, Take 180 mg by mouth daily as needed for allergies. , Disp: , Rfl:    hydrocortisone  (ANUSOL -HC) 25 MG suppository, Place 25 mg rectally daily as needed., Disp: , Rfl:     Melatonin 10 MG TABS, Take 1 tablet by mouth at bedtime. , Disp: , Rfl:    Multiple Vitamin (MULTIVITAMIN) tablet, Take 1 tablet by mouth daily., Disp: , Rfl:    Omega 3 1200 MG CAPS, , Disp: , Rfl:    Psyllium (METAMUCIL FIBER PO), Take 2 capsules by mouth daily. , Disp: , Rfl:    tamoxifen  (NOLVADEX ) 20 MG tablet, Take 1 tablet (20 mg total) by mouth daily., Disp: 30 tablet, Rfl: 11   vitamin C (ASCORBIC ACID) 500 MG tablet, Take 500 mg by mouth daily., Disp: , Rfl:   Observations/Objective: Patient is well-developed, well-nourished in no acute distress.  Resting comfortably at home.  Head is normocephalic, atraumatic.  No labored breathing.  Speech is clear and coherent with logical content.  Patient is alert and oriented at baseline.    Assessment and Plan: 1. Cellulitis of left lower extremity (Primary) - cephALEXin (KEFLEX) 500 MG capsule; Take 1 capsule (500 mg total) by mouth 4 (four) times daily.  Dispense: 20 capsule; Refill: 0 - triamcinolone cream (KENALOG) 0.1 %; Apply 1 Application topically 2 (two) times daily.  Dispense: 30 g; Refill: 0  2. Insect bite of left ankle, initial encounter  - Add Keflex - Triamcinolone for topical inflammation and irritation - Continue wound care - Continue Epsom salt soaks - Cover as needed for drainage and keep clean and dry - Seek further evaluation if continues to worsen  Follow Up Instructions: I discussed the assessment and treatment plan with the patient. The patient was provided an opportunity to ask questions and all were answered. The patient agreed with the plan and demonstrated an understanding of the instructions.  A copy of instructions were sent to the patient via MyChart unless otherwise noted below.    The patient was advised to call back or seek an in-person evaluation if the symptoms worsen or if the condition fails to improve as anticipated.    Delon CHRISTELLA Dickinson, PA-C

## 2024-06-05 ENCOUNTER — Encounter: Payer: Self-pay | Admitting: Hematology

## 2024-06-08 ENCOUNTER — Inpatient Hospital Stay: Payer: Self-pay | Attending: Hematology

## 2024-06-08 ENCOUNTER — Ambulatory Visit
Admission: RE | Admit: 2024-06-08 | Discharge: 2024-06-08 | Disposition: A | Discharge status: Home / Self Care | Payer: PRIVATE HEALTH INSURANCE | Source: Ambulatory Visit | Attending: Hematology | Admitting: Hematology

## 2024-06-08 DIAGNOSIS — Z9189 Other specified personal risk factors, not elsewhere classified: Secondary | ICD-10-CM

## 2024-06-08 DIAGNOSIS — Z1321 Encounter for screening for nutritional disorder: Secondary | ICD-10-CM

## 2024-06-08 DIAGNOSIS — C50411 Malignant neoplasm of upper-outer quadrant of right female breast: Secondary | ICD-10-CM

## 2024-06-08 LAB — CBC WITH DIFFERENTIAL (CANCER CENTER ONLY)
Abs Immature Granulocytes: 0.01 K/uL (ref 0.00–0.07)
Basophils Absolute: 0.1 K/uL (ref 0.0–0.1)
Basophils Relative: 1 %
Eosinophils Absolute: 0.1 K/uL (ref 0.0–0.5)
Eosinophils Relative: 2 %
HCT: 38.9 % (ref 36.0–46.0)
Hemoglobin: 13.5 g/dL (ref 12.0–15.0)
Immature Granulocytes: 0 %
Lymphocytes Relative: 41 %
Lymphs Abs: 2.5 K/uL (ref 0.7–4.0)
MCH: 32.1 pg (ref 26.0–34.0)
MCHC: 34.7 g/dL (ref 30.0–36.0)
MCV: 92.4 fL (ref 80.0–100.0)
Monocytes Absolute: 0.5 K/uL (ref 0.1–1.0)
Monocytes Relative: 8 %
Neutro Abs: 3 K/uL (ref 1.7–7.7)
Neutrophils Relative %: 48 %
Platelet Count: 208 K/uL (ref 150–400)
RBC: 4.21 MIL/uL (ref 3.87–5.11)
RDW: 12.5 % (ref 11.5–15.5)
WBC Count: 6.2 K/uL (ref 4.0–10.5)
nRBC: 0 % (ref 0.0–0.2)

## 2024-06-08 LAB — CMP (CANCER CENTER ONLY)
ALT: 16 U/L (ref 0–44)
AST: 25 U/L (ref 15–41)
Albumin: 4.4 g/dL (ref 3.5–5.0)
Alkaline Phosphatase: 47 U/L (ref 38–126)
Anion gap: 9 (ref 5–15)
BUN: 25 mg/dL — ABNORMAL HIGH (ref 6–20)
CO2: 27 mmol/L (ref 22–32)
Calcium: 9.5 mg/dL (ref 8.9–10.3)
Chloride: 100 mmol/L (ref 98–111)
Creatinine: 0.87 mg/dL (ref 0.44–1.00)
GFR, Estimated: >60 mL/min
Glucose, Bld: 92 mg/dL (ref 70–99)
Potassium: 4.6 mmol/L (ref 3.5–5.1)
Sodium: 136 mmol/L (ref 135–145)
Total Bilirubin: 0.5 mg/dL (ref 0.0–1.2)
Total Protein: 7.1 g/dL (ref 6.5–8.1)

## 2024-06-08 LAB — VITAMIN D 25 HYDROXY (VIT D DEFICIENCY, FRACTURES): Vit D, 25-Hydroxy: 79.5 ng/mL (ref 30–100)

## 2024-06-08 MED ORDER — GADOPICLENOL 0.5 MMOL/ML IV SOLN
7.0000 mL | Freq: Once | INTRAVENOUS | Status: AC | PRN
  Administered 2024-06-08: 7 mL via INTRAVENOUS

## 2024-06-12 ENCOUNTER — Encounter: Payer: Self-pay | Admitting: Hematology

## 2024-12-05 ENCOUNTER — Ambulatory Visit: Admitting: Hematology

## 2024-12-05 ENCOUNTER — Other Ambulatory Visit
